# Patient Record
Sex: Female | Born: 1940
Health system: Southern US, Community
[De-identification: ages and names within clinical notes are randomized; demographics above are authoritative.]

## PROBLEM LIST (undated history)

## (undated) DIAGNOSIS — E559 Vitamin D deficiency, unspecified: Secondary | ICD-10-CM

## (undated) DIAGNOSIS — R519 Headache, unspecified: Secondary | ICD-10-CM

## (undated) DIAGNOSIS — I10 Essential (primary) hypertension: Secondary | ICD-10-CM

## (undated) DIAGNOSIS — Z973 Presence of spectacles and contact lenses: Secondary | ICD-10-CM

## (undated) DIAGNOSIS — E785 Hyperlipidemia, unspecified: Secondary | ICD-10-CM

## (undated) DIAGNOSIS — F419 Anxiety disorder, unspecified: Secondary | ICD-10-CM

## (undated) DIAGNOSIS — G4700 Insomnia, unspecified: Secondary | ICD-10-CM

## (undated) DIAGNOSIS — Q899 Congenital malformation, unspecified: Secondary | ICD-10-CM

## (undated) DIAGNOSIS — F329 Major depressive disorder, single episode, unspecified: Secondary | ICD-10-CM

## (undated) DIAGNOSIS — K219 Gastro-esophageal reflux disease without esophagitis: Secondary | ICD-10-CM

## (undated) DIAGNOSIS — F32A Depression, unspecified: Secondary | ICD-10-CM

## (undated) DIAGNOSIS — J309 Allergic rhinitis, unspecified: Secondary | ICD-10-CM

## (undated) DIAGNOSIS — R51 Headache: Secondary | ICD-10-CM

## (undated) DIAGNOSIS — K589 Irritable bowel syndrome without diarrhea: Secondary | ICD-10-CM

## (undated) DIAGNOSIS — R413 Other amnesia: Secondary | ICD-10-CM

## (undated) DIAGNOSIS — M1990 Unspecified osteoarthritis, unspecified site: Secondary | ICD-10-CM

## (undated) HISTORY — DX: Allergic rhinitis, unspecified: J30.9

## (undated) HISTORY — DX: Other amnesia: R41.3

## (undated) HISTORY — DX: Vitamin D deficiency, unspecified: E55.9

## (undated) HISTORY — DX: Irritable bowel syndrome, unspecified: K58.9

## (undated) HISTORY — DX: Depression, unspecified: F32.A

## (undated) HISTORY — PX: ABDOMINAL HYSTERECTOMY: SHX81

## (undated) HISTORY — DX: Hyperlipidemia, unspecified: E78.5

## (undated) HISTORY — DX: Headache: R51

## (undated) HISTORY — DX: Major depressive disorder, single episode, unspecified: F32.9

## (undated) HISTORY — DX: Headache, unspecified: R51.9

## (undated) HISTORY — DX: Insomnia, unspecified: G47.00

## (undated) HISTORY — DX: Congenital malformation, unspecified: Q89.9

## (undated) HISTORY — DX: Anxiety disorder, unspecified: F41.9

## (undated) HISTORY — DX: Gastro-esophageal reflux disease without esophagitis: K21.9

## (undated) HISTORY — PX: CATARACT EXTRACTION: SUR2

## (undated) HISTORY — PX: NECK SURGERY: SHX720

---

## 2000-08-07 ENCOUNTER — Encounter: Admission: RE | Admit: 2000-08-07 | Discharge: 2000-08-07 | Payer: Self-pay | Admitting: Neurosurgery

## 2000-08-07 ENCOUNTER — Encounter: Payer: Self-pay | Admitting: Neurosurgery

## 2001-07-19 ENCOUNTER — Other Ambulatory Visit: Admission: RE | Admit: 2001-07-19 | Discharge: 2001-07-19 | Payer: Self-pay | Admitting: *Deleted

## 2001-09-20 ENCOUNTER — Ambulatory Visit (HOSPITAL_COMMUNITY): Admission: RE | Admit: 2001-09-20 | Discharge: 2001-09-20 | Payer: Self-pay | Admitting: Gastroenterology

## 2001-09-27 ENCOUNTER — Encounter: Admission: RE | Admit: 2001-09-27 | Discharge: 2001-09-27 | Payer: Self-pay | Admitting: *Deleted

## 2002-07-25 ENCOUNTER — Other Ambulatory Visit: Admission: RE | Admit: 2002-07-25 | Discharge: 2002-07-25 | Payer: Self-pay | Admitting: *Deleted

## 2003-11-13 ENCOUNTER — Encounter: Admission: RE | Admit: 2003-11-13 | Discharge: 2003-11-13 | Payer: Self-pay | Admitting: Orthopedic Surgery

## 2004-07-22 ENCOUNTER — Ambulatory Visit (HOSPITAL_COMMUNITY): Admission: RE | Admit: 2004-07-22 | Discharge: 2004-07-22 | Payer: Self-pay | Admitting: *Deleted

## 2005-03-24 ENCOUNTER — Ambulatory Visit (HOSPITAL_COMMUNITY): Admission: RE | Admit: 2005-03-24 | Discharge: 2005-03-24 | Payer: Self-pay | Admitting: Gastroenterology

## 2005-03-24 ENCOUNTER — Encounter (INDEPENDENT_AMBULATORY_CARE_PROVIDER_SITE_OTHER): Payer: Self-pay | Admitting: *Deleted

## 2006-04-11 ENCOUNTER — Ambulatory Visit (HOSPITAL_COMMUNITY): Admission: RE | Admit: 2006-04-11 | Discharge: 2006-04-11 | Payer: Self-pay | Admitting: *Deleted

## 2006-04-18 ENCOUNTER — Encounter: Admission: RE | Admit: 2006-04-18 | Discharge: 2006-04-18 | Payer: Self-pay | Admitting: *Deleted

## 2007-05-18 ENCOUNTER — Encounter: Admission: RE | Admit: 2007-05-18 | Discharge: 2007-05-18 | Payer: Self-pay | Admitting: Obstetrics and Gynecology

## 2008-05-19 ENCOUNTER — Encounter: Admission: RE | Admit: 2008-05-19 | Discharge: 2008-05-19 | Payer: Self-pay | Admitting: Family Medicine

## 2008-09-11 ENCOUNTER — Encounter: Admission: RE | Admit: 2008-09-11 | Discharge: 2008-09-11 | Payer: Self-pay | Admitting: Orthopedic Surgery

## 2009-06-05 ENCOUNTER — Encounter: Admission: RE | Admit: 2009-06-05 | Discharge: 2009-06-05 | Payer: Self-pay | Admitting: Family Medicine

## 2010-05-21 ENCOUNTER — Ambulatory Visit (HOSPITAL_COMMUNITY): Admission: RE | Admit: 2010-05-21 | Discharge: 2010-05-21 | Payer: Self-pay | Admitting: Obstetrics and Gynecology

## 2010-06-10 ENCOUNTER — Ambulatory Visit: Admission: RE | Admit: 2010-06-10 | Discharge: 2010-06-10 | Payer: Self-pay | Admitting: Gynecologic Oncology

## 2010-06-15 ENCOUNTER — Encounter: Payer: Self-pay | Admitting: Obstetrics & Gynecology

## 2010-06-15 ENCOUNTER — Inpatient Hospital Stay (HOSPITAL_COMMUNITY): Admission: RE | Admit: 2010-06-15 | Discharge: 2010-06-21 | Payer: Self-pay | Admitting: Obstetrics & Gynecology

## 2010-06-16 ENCOUNTER — Encounter: Payer: Self-pay | Admitting: Obstetrics & Gynecology

## 2010-07-29 ENCOUNTER — Ambulatory Visit: Admission: RE | Admit: 2010-07-29 | Discharge: 2010-07-29 | Payer: Self-pay | Admitting: Gynecologic Oncology

## 2011-02-27 LAB — TYPE AND SCREEN
ABO/RH(D): A POS
ABO/RH(D): A POS
Antibody Screen: NEGATIVE
Antibody Screen: NEGATIVE

## 2011-02-27 LAB — DIFFERENTIAL
Basophils Absolute: 0 10*3/uL (ref 0.0–0.1)
Basophils Relative: 0 % (ref 0–1)
Eosinophils Absolute: 0.1 10*3/uL (ref 0.0–0.7)
Eosinophils Relative: 2 % (ref 0–5)
Lymphocytes Relative: 21 % (ref 12–46)
Lymphs Abs: 1.6 10*3/uL (ref 0.7–4.0)
Monocytes Absolute: 0.6 10*3/uL (ref 0.1–1.0)
Monocytes Relative: 7 % (ref 3–12)
Neutro Abs: 5.2 10*3/uL (ref 1.7–7.7)
Neutrophils Relative %: 70 % (ref 43–77)

## 2011-02-27 LAB — BASIC METABOLIC PANEL
BUN: 10 mg/dL (ref 6–23)
BUN: 8 mg/dL (ref 6–23)
BUN: 8 mg/dL (ref 6–23)
CO2: 25 mEq/L (ref 19–32)
CO2: 28 mEq/L (ref 19–32)
CO2: 29 mEq/L (ref 19–32)
Calcium: 8 mg/dL — ABNORMAL LOW (ref 8.4–10.5)
Calcium: 8.2 mg/dL — ABNORMAL LOW (ref 8.4–10.5)
Calcium: 8.9 mg/dL (ref 8.4–10.5)
Chloride: 101 mEq/L (ref 96–112)
Chloride: 102 mEq/L (ref 96–112)
Chloride: 106 mEq/L (ref 96–112)
Creatinine, Ser: 0.78 mg/dL (ref 0.4–1.2)
Creatinine, Ser: 0.85 mg/dL (ref 0.4–1.2)
Creatinine, Ser: 0.94 mg/dL (ref 0.4–1.2)
GFR calc Af Amer: >60 mL/min (ref >60)
GFR calc Af Amer: >60 mL/min (ref >60)
GFR calc Af Amer: >60 mL/min (ref >60)
GFR calc non Af Amer: 59 mL/min — ABNORMAL LOW (ref >60)
GFR calc non Af Amer: >60 mL/min (ref >60)
GFR calc non Af Amer: >60 mL/min (ref >60)
Glucose, Bld: 129 mg/dL — ABNORMAL HIGH (ref 70–99)
Glucose, Bld: 148 mg/dL — ABNORMAL HIGH (ref 70–99)
Glucose, Bld: 173 mg/dL — ABNORMAL HIGH (ref 70–99)
Potassium: 3.3 mEq/L — ABNORMAL LOW (ref 3.5–5.1)
Potassium: 4.3 mEq/L (ref 3.5–5.1)
Potassium: 4.4 mEq/L (ref 3.5–5.1)
Sodium: 134 mEq/L — ABNORMAL LOW (ref 135–145)
Sodium: 136 mEq/L (ref 135–145)
Sodium: 137 mEq/L (ref 135–145)

## 2011-02-27 LAB — CBC
HCT: 23.1 % — ABNORMAL LOW (ref 36.0–46.0)
HCT: 25.8 % — ABNORMAL LOW (ref 36.0–46.0)
HCT: 28.4 % — ABNORMAL LOW (ref 36.0–46.0)
HCT: 37 % (ref 36.0–46.0)
Hemoglobin: 12.5 g/dL (ref 12.0–15.0)
Hemoglobin: 7.7 g/dL — ABNORMAL LOW (ref 12.0–15.0)
Hemoglobin: 8.7 g/dL — ABNORMAL LOW (ref 12.0–15.0)
Hemoglobin: 9.5 g/dL — ABNORMAL LOW (ref 12.0–15.0)
MCH: 26.9 pg (ref 26.0–34.0)
MCH: 27.1 pg (ref 26.0–34.0)
MCH: 27.3 pg (ref 26.0–34.0)
MCH: 27.4 pg (ref 26.0–34.0)
MCHC: 33.2 g/dL (ref 30.0–36.0)
MCHC: 33.6 g/dL (ref 30.0–36.0)
MCHC: 33.7 g/dL (ref 30.0–36.0)
MCHC: 33.7 g/dL (ref 30.0–36.0)
MCV: 80.5 fL (ref 78.0–100.0)
MCV: 80.8 fL (ref 78.0–100.0)
MCV: 81.3 fL (ref 78.0–100.0)
MCV: 81.4 fL (ref 78.0–100.0)
Platelets: 197 10*3/uL (ref 150–400)
Platelets: 237 10*3/uL (ref 150–400)
Platelets: 265 10*3/uL (ref 150–400)
Platelets: 270 10*3/uL (ref 150–400)
RBC: 2.85 MIL/uL — ABNORMAL LOW (ref 3.87–5.11)
RBC: 3.2 MIL/uL — ABNORMAL LOW (ref 3.87–5.11)
RBC: 3.49 MIL/uL — ABNORMAL LOW (ref 3.87–5.11)
RBC: 4.55 MIL/uL (ref 3.87–5.11)
RDW: 15.6 % — ABNORMAL HIGH (ref 11.5–15.5)
RDW: 15.9 % — ABNORMAL HIGH (ref 11.5–15.5)
RDW: 16.3 % — ABNORMAL HIGH (ref 11.5–15.5)
RDW: 16.6 % — ABNORMAL HIGH (ref 11.5–15.5)
WBC: 11.3 10*3/uL — ABNORMAL HIGH (ref 4.0–10.5)
WBC: 12.3 10*3/uL — ABNORMAL HIGH (ref 4.0–10.5)
WBC: 19.7 10*3/uL — ABNORMAL HIGH (ref 4.0–10.5)
WBC: 7.6 10*3/uL (ref 4.0–10.5)

## 2011-02-27 LAB — COMPREHENSIVE METABOLIC PANEL
ALT: 13 U/L (ref 0–35)
AST: 17 U/L (ref 0–37)
Albumin: 3.5 g/dL (ref 3.5–5.2)
Alkaline Phosphatase: 68 U/L (ref 39–117)
BUN: 12 mg/dL (ref 6–23)
CO2: 30 mEq/L (ref 19–32)
Calcium: 9.4 mg/dL (ref 8.4–10.5)
Chloride: 100 mEq/L (ref 96–112)
Creatinine, Ser: 0.99 mg/dL (ref 0.4–1.2)
GFR calc Af Amer: >60 mL/min (ref >60)
GFR calc non Af Amer: 55 mL/min — ABNORMAL LOW (ref >60)
Glucose, Bld: 121 mg/dL — ABNORMAL HIGH (ref 70–99)
Potassium: 3 mEq/L — ABNORMAL LOW (ref 3.5–5.1)
Sodium: 138 mEq/L (ref 135–145)
Total Bilirubin: 0.1 mg/dL — ABNORMAL LOW (ref 0.3–1.2)
Total Protein: 6.8 g/dL (ref 6.0–8.3)

## 2011-02-27 LAB — HEMOGLOBIN AND HEMATOCRIT, BLOOD
HCT: 23.5 % — ABNORMAL LOW (ref 36.0–46.0)
HCT: 30.1 % — ABNORMAL LOW (ref 36.0–46.0)
Hemoglobin: 10.5 g/dL — ABNORMAL LOW (ref 12.0–15.0)
Hemoglobin: 8 g/dL — ABNORMAL LOW (ref 12.0–15.0)

## 2011-02-27 LAB — URINALYSIS, ROUTINE W REFLEX MICROSCOPIC
Bilirubin Urine: NEGATIVE
Glucose, UA: NEGATIVE mg/dL
Hgb urine dipstick: NEGATIVE
Ketones, ur: NEGATIVE mg/dL
Nitrite: NEGATIVE
Protein, ur: NEGATIVE mg/dL
Specific Gravity, Urine: 1.013 (ref 1.005–1.030)
Urobilinogen, UA: 1 mg/dL (ref 0.0–1.0)
pH: 7 (ref 5.0–8.0)

## 2011-02-27 LAB — PREPARE RBC (CROSSMATCH)

## 2011-02-27 LAB — ABO/RH: ABO/RH(D): A POS

## 2011-02-27 LAB — SURGICAL PCR SCREEN
MRSA, PCR: NEGATIVE
Staphylococcus aureus: NEGATIVE

## 2011-02-27 LAB — TROPONIN I: Troponin I: 0.02 ng/mL (ref 0.00–0.06)

## 2011-02-27 LAB — POCT I-STAT 4, (NA,K, GLUC, HGB,HCT)
Glucose, Bld: 123 mg/dL — ABNORMAL HIGH (ref 70–99)
HCT: 25 % — ABNORMAL LOW (ref 36.0–46.0)
Hemoglobin: 8.5 g/dL — ABNORMAL LOW (ref 12.0–15.0)
Potassium: 3.2 mEq/L — ABNORMAL LOW (ref 3.5–5.1)
Sodium: 137 mEq/L (ref 135–145)

## 2011-02-27 LAB — URINE MICROSCOPIC-ADD ON

## 2011-02-27 LAB — POTASSIUM: Potassium: 3.4 mEq/L — ABNORMAL LOW (ref 3.5–5.1)

## 2011-12-29 DIAGNOSIS — E7800 Pure hypercholesterolemia, unspecified: Secondary | ICD-10-CM | POA: Diagnosis not present

## 2011-12-29 DIAGNOSIS — I10 Essential (primary) hypertension: Secondary | ICD-10-CM | POA: Diagnosis not present

## 2011-12-29 DIAGNOSIS — Z1331 Encounter for screening for depression: Secondary | ICD-10-CM | POA: Diagnosis not present

## 2011-12-29 DIAGNOSIS — F411 Generalized anxiety disorder: Secondary | ICD-10-CM | POA: Diagnosis not present

## 2012-04-25 ENCOUNTER — Other Ambulatory Visit: Payer: Self-pay | Admitting: Family Medicine

## 2012-04-25 DIAGNOSIS — Z1231 Encounter for screening mammogram for malignant neoplasm of breast: Secondary | ICD-10-CM

## 2012-05-14 ENCOUNTER — Ambulatory Visit
Admission: RE | Admit: 2012-05-14 | Discharge: 2012-05-14 | Disposition: A | Discharge status: Home / Self Care | Payer: Medicare Other | Source: Ambulatory Visit | Attending: Family Medicine | Admitting: Family Medicine

## 2012-05-14 DIAGNOSIS — Z1231 Encounter for screening mammogram for malignant neoplasm of breast: Secondary | ICD-10-CM

## 2012-05-17 ENCOUNTER — Other Ambulatory Visit: Payer: Self-pay | Admitting: Family Medicine

## 2012-05-17 DIAGNOSIS — R928 Other abnormal and inconclusive findings on diagnostic imaging of breast: Secondary | ICD-10-CM

## 2012-06-07 DIAGNOSIS — R143 Flatulence: Secondary | ICD-10-CM | POA: Diagnosis not present

## 2012-06-07 DIAGNOSIS — R141 Gas pain: Secondary | ICD-10-CM | POA: Diagnosis not present

## 2012-06-07 DIAGNOSIS — K5900 Constipation, unspecified: Secondary | ICD-10-CM | POA: Diagnosis not present

## 2012-06-07 DIAGNOSIS — R1033 Periumbilical pain: Secondary | ICD-10-CM | POA: Diagnosis not present

## 2012-06-15 ENCOUNTER — Ambulatory Visit
Admission: RE | Admit: 2012-06-15 | Discharge: 2012-06-15 | Disposition: A | Discharge status: Home / Self Care | Payer: Medicare Other | Source: Ambulatory Visit | Attending: Family Medicine | Admitting: Family Medicine

## 2012-06-15 DIAGNOSIS — R928 Other abnormal and inconclusive findings on diagnostic imaging of breast: Secondary | ICD-10-CM

## 2012-06-15 DIAGNOSIS — N6489 Other specified disorders of breast: Secondary | ICD-10-CM | POA: Diagnosis not present

## 2012-06-18 DIAGNOSIS — H01009 Unspecified blepharitis unspecified eye, unspecified eyelid: Secondary | ICD-10-CM | POA: Diagnosis not present

## 2012-06-18 DIAGNOSIS — H02059 Trichiasis without entropian unspecified eye, unspecified eyelid: Secondary | ICD-10-CM | POA: Diagnosis not present

## 2012-06-18 DIAGNOSIS — H04129 Dry eye syndrome of unspecified lacrimal gland: Secondary | ICD-10-CM | POA: Diagnosis not present

## 2012-06-18 DIAGNOSIS — H2510 Age-related nuclear cataract, unspecified eye: Secondary | ICD-10-CM | POA: Diagnosis not present

## 2012-12-20 DIAGNOSIS — F411 Generalized anxiety disorder: Secondary | ICD-10-CM | POA: Diagnosis not present

## 2012-12-20 DIAGNOSIS — I10 Essential (primary) hypertension: Secondary | ICD-10-CM | POA: Diagnosis not present

## 2013-04-09 DIAGNOSIS — E7800 Pure hypercholesterolemia, unspecified: Secondary | ICD-10-CM | POA: Diagnosis not present

## 2013-04-17 DIAGNOSIS — M5137 Other intervertebral disc degeneration, lumbosacral region: Secondary | ICD-10-CM | POA: Diagnosis not present

## 2013-04-17 DIAGNOSIS — M999 Biomechanical lesion, unspecified: Secondary | ICD-10-CM | POA: Diagnosis not present

## 2013-04-17 DIAGNOSIS — M5106 Intervertebral disc disorders with myelopathy, lumbar region: Secondary | ICD-10-CM | POA: Diagnosis not present

## 2013-04-17 DIAGNOSIS — M5430 Sciatica, unspecified side: Secondary | ICD-10-CM | POA: Diagnosis not present

## 2013-04-23 DIAGNOSIS — M999 Biomechanical lesion, unspecified: Secondary | ICD-10-CM | POA: Diagnosis not present

## 2013-04-23 DIAGNOSIS — M5137 Other intervertebral disc degeneration, lumbosacral region: Secondary | ICD-10-CM | POA: Diagnosis not present

## 2013-04-23 DIAGNOSIS — M5430 Sciatica, unspecified side: Secondary | ICD-10-CM | POA: Diagnosis not present

## 2013-04-23 DIAGNOSIS — M5106 Intervertebral disc disorders with myelopathy, lumbar region: Secondary | ICD-10-CM | POA: Diagnosis not present

## 2013-04-24 DIAGNOSIS — M5106 Intervertebral disc disorders with myelopathy, lumbar region: Secondary | ICD-10-CM | POA: Diagnosis not present

## 2013-04-24 DIAGNOSIS — M5430 Sciatica, unspecified side: Secondary | ICD-10-CM | POA: Diagnosis not present

## 2013-04-24 DIAGNOSIS — M5137 Other intervertebral disc degeneration, lumbosacral region: Secondary | ICD-10-CM | POA: Diagnosis not present

## 2013-04-24 DIAGNOSIS — M999 Biomechanical lesion, unspecified: Secondary | ICD-10-CM | POA: Diagnosis not present

## 2013-04-25 DIAGNOSIS — M5106 Intervertebral disc disorders with myelopathy, lumbar region: Secondary | ICD-10-CM | POA: Diagnosis not present

## 2013-04-25 DIAGNOSIS — M5137 Other intervertebral disc degeneration, lumbosacral region: Secondary | ICD-10-CM | POA: Diagnosis not present

## 2013-04-25 DIAGNOSIS — M999 Biomechanical lesion, unspecified: Secondary | ICD-10-CM | POA: Diagnosis not present

## 2013-04-25 DIAGNOSIS — M5430 Sciatica, unspecified side: Secondary | ICD-10-CM | POA: Diagnosis not present

## 2013-04-29 DIAGNOSIS — M5137 Other intervertebral disc degeneration, lumbosacral region: Secondary | ICD-10-CM | POA: Diagnosis not present

## 2013-04-29 DIAGNOSIS — M5106 Intervertebral disc disorders with myelopathy, lumbar region: Secondary | ICD-10-CM | POA: Diagnosis not present

## 2013-04-29 DIAGNOSIS — M5430 Sciatica, unspecified side: Secondary | ICD-10-CM | POA: Diagnosis not present

## 2013-04-29 DIAGNOSIS — M999 Biomechanical lesion, unspecified: Secondary | ICD-10-CM | POA: Diagnosis not present

## 2013-04-30 DIAGNOSIS — M999 Biomechanical lesion, unspecified: Secondary | ICD-10-CM | POA: Diagnosis not present

## 2013-04-30 DIAGNOSIS — M5106 Intervertebral disc disorders with myelopathy, lumbar region: Secondary | ICD-10-CM | POA: Diagnosis not present

## 2013-04-30 DIAGNOSIS — M5137 Other intervertebral disc degeneration, lumbosacral region: Secondary | ICD-10-CM | POA: Diagnosis not present

## 2013-04-30 DIAGNOSIS — M5430 Sciatica, unspecified side: Secondary | ICD-10-CM | POA: Diagnosis not present

## 2013-05-01 DIAGNOSIS — M5137 Other intervertebral disc degeneration, lumbosacral region: Secondary | ICD-10-CM | POA: Diagnosis not present

## 2013-05-01 DIAGNOSIS — M5106 Intervertebral disc disorders with myelopathy, lumbar region: Secondary | ICD-10-CM | POA: Diagnosis not present

## 2013-05-01 DIAGNOSIS — M5430 Sciatica, unspecified side: Secondary | ICD-10-CM | POA: Diagnosis not present

## 2013-05-01 DIAGNOSIS — M999 Biomechanical lesion, unspecified: Secondary | ICD-10-CM | POA: Diagnosis not present

## 2013-05-08 DIAGNOSIS — M5106 Intervertebral disc disorders with myelopathy, lumbar region: Secondary | ICD-10-CM | POA: Diagnosis not present

## 2013-05-08 DIAGNOSIS — M5430 Sciatica, unspecified side: Secondary | ICD-10-CM | POA: Diagnosis not present

## 2013-05-08 DIAGNOSIS — M999 Biomechanical lesion, unspecified: Secondary | ICD-10-CM | POA: Diagnosis not present

## 2013-05-08 DIAGNOSIS — M5137 Other intervertebral disc degeneration, lumbosacral region: Secondary | ICD-10-CM | POA: Diagnosis not present

## 2013-05-09 DIAGNOSIS — M5430 Sciatica, unspecified side: Secondary | ICD-10-CM | POA: Diagnosis not present

## 2013-05-09 DIAGNOSIS — M5137 Other intervertebral disc degeneration, lumbosacral region: Secondary | ICD-10-CM | POA: Diagnosis not present

## 2013-05-09 DIAGNOSIS — M999 Biomechanical lesion, unspecified: Secondary | ICD-10-CM | POA: Diagnosis not present

## 2013-05-09 DIAGNOSIS — M5106 Intervertebral disc disorders with myelopathy, lumbar region: Secondary | ICD-10-CM | POA: Diagnosis not present

## 2013-05-13 DIAGNOSIS — M5430 Sciatica, unspecified side: Secondary | ICD-10-CM | POA: Diagnosis not present

## 2013-05-13 DIAGNOSIS — M5106 Intervertebral disc disorders with myelopathy, lumbar region: Secondary | ICD-10-CM | POA: Diagnosis not present

## 2013-05-13 DIAGNOSIS — M5137 Other intervertebral disc degeneration, lumbosacral region: Secondary | ICD-10-CM | POA: Diagnosis not present

## 2013-05-13 DIAGNOSIS — M999 Biomechanical lesion, unspecified: Secondary | ICD-10-CM | POA: Diagnosis not present

## 2013-05-14 DIAGNOSIS — M5106 Intervertebral disc disorders with myelopathy, lumbar region: Secondary | ICD-10-CM | POA: Diagnosis not present

## 2013-05-14 DIAGNOSIS — M5137 Other intervertebral disc degeneration, lumbosacral region: Secondary | ICD-10-CM | POA: Diagnosis not present

## 2013-05-14 DIAGNOSIS — M5430 Sciatica, unspecified side: Secondary | ICD-10-CM | POA: Diagnosis not present

## 2013-05-14 DIAGNOSIS — M999 Biomechanical lesion, unspecified: Secondary | ICD-10-CM | POA: Diagnosis not present

## 2013-05-28 DIAGNOSIS — M5137 Other intervertebral disc degeneration, lumbosacral region: Secondary | ICD-10-CM | POA: Diagnosis not present

## 2013-05-28 DIAGNOSIS — M999 Biomechanical lesion, unspecified: Secondary | ICD-10-CM | POA: Diagnosis not present

## 2013-05-28 DIAGNOSIS — M5106 Intervertebral disc disorders with myelopathy, lumbar region: Secondary | ICD-10-CM | POA: Diagnosis not present

## 2013-05-28 DIAGNOSIS — M5430 Sciatica, unspecified side: Secondary | ICD-10-CM | POA: Diagnosis not present

## 2013-05-30 DIAGNOSIS — M5137 Other intervertebral disc degeneration, lumbosacral region: Secondary | ICD-10-CM | POA: Diagnosis not present

## 2013-05-30 DIAGNOSIS — M999 Biomechanical lesion, unspecified: Secondary | ICD-10-CM | POA: Diagnosis not present

## 2013-05-30 DIAGNOSIS — M5106 Intervertebral disc disorders with myelopathy, lumbar region: Secondary | ICD-10-CM | POA: Diagnosis not present

## 2013-05-30 DIAGNOSIS — M5430 Sciatica, unspecified side: Secondary | ICD-10-CM | POA: Diagnosis not present

## 2013-06-04 DIAGNOSIS — M5430 Sciatica, unspecified side: Secondary | ICD-10-CM | POA: Diagnosis not present

## 2013-06-04 DIAGNOSIS — M5106 Intervertebral disc disorders with myelopathy, lumbar region: Secondary | ICD-10-CM | POA: Diagnosis not present

## 2013-06-04 DIAGNOSIS — M999 Biomechanical lesion, unspecified: Secondary | ICD-10-CM | POA: Diagnosis not present

## 2013-06-04 DIAGNOSIS — M5137 Other intervertebral disc degeneration, lumbosacral region: Secondary | ICD-10-CM | POA: Diagnosis not present

## 2013-06-05 DIAGNOSIS — M5137 Other intervertebral disc degeneration, lumbosacral region: Secondary | ICD-10-CM | POA: Diagnosis not present

## 2013-06-05 DIAGNOSIS — M999 Biomechanical lesion, unspecified: Secondary | ICD-10-CM | POA: Diagnosis not present

## 2013-06-05 DIAGNOSIS — M5430 Sciatica, unspecified side: Secondary | ICD-10-CM | POA: Diagnosis not present

## 2013-06-05 DIAGNOSIS — M5106 Intervertebral disc disorders with myelopathy, lumbar region: Secondary | ICD-10-CM | POA: Diagnosis not present

## 2013-06-18 DIAGNOSIS — M999 Biomechanical lesion, unspecified: Secondary | ICD-10-CM | POA: Diagnosis not present

## 2013-06-18 DIAGNOSIS — M5137 Other intervertebral disc degeneration, lumbosacral region: Secondary | ICD-10-CM | POA: Diagnosis not present

## 2013-06-18 DIAGNOSIS — M5430 Sciatica, unspecified side: Secondary | ICD-10-CM | POA: Diagnosis not present

## 2013-06-18 DIAGNOSIS — M5106 Intervertebral disc disorders with myelopathy, lumbar region: Secondary | ICD-10-CM | POA: Diagnosis not present

## 2013-06-20 DIAGNOSIS — M5430 Sciatica, unspecified side: Secondary | ICD-10-CM | POA: Diagnosis not present

## 2013-06-20 DIAGNOSIS — M999 Biomechanical lesion, unspecified: Secondary | ICD-10-CM | POA: Diagnosis not present

## 2013-06-20 DIAGNOSIS — M5137 Other intervertebral disc degeneration, lumbosacral region: Secondary | ICD-10-CM | POA: Diagnosis not present

## 2013-06-20 DIAGNOSIS — M5106 Intervertebral disc disorders with myelopathy, lumbar region: Secondary | ICD-10-CM | POA: Diagnosis not present

## 2013-06-25 DIAGNOSIS — M5430 Sciatica, unspecified side: Secondary | ICD-10-CM | POA: Diagnosis not present

## 2013-06-25 DIAGNOSIS — M5137 Other intervertebral disc degeneration, lumbosacral region: Secondary | ICD-10-CM | POA: Diagnosis not present

## 2013-06-25 DIAGNOSIS — M5106 Intervertebral disc disorders with myelopathy, lumbar region: Secondary | ICD-10-CM | POA: Diagnosis not present

## 2013-06-25 DIAGNOSIS — M999 Biomechanical lesion, unspecified: Secondary | ICD-10-CM | POA: Diagnosis not present

## 2013-07-02 DIAGNOSIS — M5137 Other intervertebral disc degeneration, lumbosacral region: Secondary | ICD-10-CM | POA: Diagnosis not present

## 2013-07-02 DIAGNOSIS — M5106 Intervertebral disc disorders with myelopathy, lumbar region: Secondary | ICD-10-CM | POA: Diagnosis not present

## 2013-07-02 DIAGNOSIS — M5430 Sciatica, unspecified side: Secondary | ICD-10-CM | POA: Diagnosis not present

## 2013-07-02 DIAGNOSIS — M999 Biomechanical lesion, unspecified: Secondary | ICD-10-CM | POA: Diagnosis not present

## 2013-07-04 DIAGNOSIS — M5137 Other intervertebral disc degeneration, lumbosacral region: Secondary | ICD-10-CM | POA: Diagnosis not present

## 2013-07-04 DIAGNOSIS — M5430 Sciatica, unspecified side: Secondary | ICD-10-CM | POA: Diagnosis not present

## 2013-07-04 DIAGNOSIS — M999 Biomechanical lesion, unspecified: Secondary | ICD-10-CM | POA: Diagnosis not present

## 2013-07-04 DIAGNOSIS — M5106 Intervertebral disc disorders with myelopathy, lumbar region: Secondary | ICD-10-CM | POA: Diagnosis not present

## 2013-07-11 DIAGNOSIS — M5137 Other intervertebral disc degeneration, lumbosacral region: Secondary | ICD-10-CM | POA: Diagnosis not present

## 2013-07-11 DIAGNOSIS — M999 Biomechanical lesion, unspecified: Secondary | ICD-10-CM | POA: Diagnosis not present

## 2013-07-11 DIAGNOSIS — M5106 Intervertebral disc disorders with myelopathy, lumbar region: Secondary | ICD-10-CM | POA: Diagnosis not present

## 2013-07-11 DIAGNOSIS — M5430 Sciatica, unspecified side: Secondary | ICD-10-CM | POA: Diagnosis not present

## 2013-07-15 DIAGNOSIS — M999 Biomechanical lesion, unspecified: Secondary | ICD-10-CM | POA: Diagnosis not present

## 2013-07-15 DIAGNOSIS — M5137 Other intervertebral disc degeneration, lumbosacral region: Secondary | ICD-10-CM | POA: Diagnosis not present

## 2013-07-15 DIAGNOSIS — M5430 Sciatica, unspecified side: Secondary | ICD-10-CM | POA: Diagnosis not present

## 2013-07-15 DIAGNOSIS — M5106 Intervertebral disc disorders with myelopathy, lumbar region: Secondary | ICD-10-CM | POA: Diagnosis not present

## 2013-07-24 ENCOUNTER — Other Ambulatory Visit: Payer: Self-pay

## 2013-07-24 DIAGNOSIS — Z1231 Encounter for screening mammogram for malignant neoplasm of breast: Secondary | ICD-10-CM

## 2013-07-29 ENCOUNTER — Ambulatory Visit
Admission: RE | Admit: 2013-07-29 | Discharge: 2013-07-29 | Disposition: A | Discharge status: Home / Self Care | Payer: Medicare Other | Source: Ambulatory Visit

## 2013-07-29 DIAGNOSIS — Z1231 Encounter for screening mammogram for malignant neoplasm of breast: Secondary | ICD-10-CM | POA: Diagnosis not present

## 2013-07-30 DIAGNOSIS — H2510 Age-related nuclear cataract, unspecified eye: Secondary | ICD-10-CM | POA: Diagnosis not present

## 2013-07-30 DIAGNOSIS — H04129 Dry eye syndrome of unspecified lacrimal gland: Secondary | ICD-10-CM | POA: Diagnosis not present

## 2013-07-30 DIAGNOSIS — H524 Presbyopia: Secondary | ICD-10-CM | POA: Diagnosis not present

## 2013-07-30 DIAGNOSIS — H25019 Cortical age-related cataract, unspecified eye: Secondary | ICD-10-CM | POA: Diagnosis not present

## 2013-08-01 ENCOUNTER — Other Ambulatory Visit: Payer: Self-pay | Admitting: Family Medicine

## 2013-08-01 DIAGNOSIS — R928 Other abnormal and inconclusive findings on diagnostic imaging of breast: Secondary | ICD-10-CM

## 2013-08-05 DIAGNOSIS — M5137 Other intervertebral disc degeneration, lumbosacral region: Secondary | ICD-10-CM | POA: Diagnosis not present

## 2013-08-05 DIAGNOSIS — M5106 Intervertebral disc disorders with myelopathy, lumbar region: Secondary | ICD-10-CM | POA: Diagnosis not present

## 2013-08-05 DIAGNOSIS — M5430 Sciatica, unspecified side: Secondary | ICD-10-CM | POA: Diagnosis not present

## 2013-08-05 DIAGNOSIS — M999 Biomechanical lesion, unspecified: Secondary | ICD-10-CM | POA: Diagnosis not present

## 2013-08-14 DIAGNOSIS — M5430 Sciatica, unspecified side: Secondary | ICD-10-CM | POA: Diagnosis not present

## 2013-08-14 DIAGNOSIS — M999 Biomechanical lesion, unspecified: Secondary | ICD-10-CM | POA: Diagnosis not present

## 2013-08-14 DIAGNOSIS — M5106 Intervertebral disc disorders with myelopathy, lumbar region: Secondary | ICD-10-CM | POA: Diagnosis not present

## 2013-08-14 DIAGNOSIS — M5137 Other intervertebral disc degeneration, lumbosacral region: Secondary | ICD-10-CM | POA: Diagnosis not present

## 2013-08-26 ENCOUNTER — Ambulatory Visit
Admission: RE | Admit: 2013-08-26 | Discharge: 2013-08-26 | Disposition: A | Discharge status: Home / Self Care | Payer: Medicare Other | Source: Ambulatory Visit | Attending: Family Medicine | Admitting: Family Medicine

## 2013-08-26 DIAGNOSIS — R928 Other abnormal and inconclusive findings on diagnostic imaging of breast: Secondary | ICD-10-CM

## 2013-08-28 DIAGNOSIS — M5137 Other intervertebral disc degeneration, lumbosacral region: Secondary | ICD-10-CM | POA: Diagnosis not present

## 2013-08-28 DIAGNOSIS — M5430 Sciatica, unspecified side: Secondary | ICD-10-CM | POA: Diagnosis not present

## 2013-08-28 DIAGNOSIS — M999 Biomechanical lesion, unspecified: Secondary | ICD-10-CM | POA: Diagnosis not present

## 2013-08-28 DIAGNOSIS — M5106 Intervertebral disc disorders with myelopathy, lumbar region: Secondary | ICD-10-CM | POA: Diagnosis not present

## 2013-09-04 DIAGNOSIS — L821 Other seborrheic keratosis: Secondary | ICD-10-CM | POA: Diagnosis not present

## 2013-09-11 DIAGNOSIS — M5106 Intervertebral disc disorders with myelopathy, lumbar region: Secondary | ICD-10-CM | POA: Diagnosis not present

## 2013-09-11 DIAGNOSIS — M5430 Sciatica, unspecified side: Secondary | ICD-10-CM | POA: Diagnosis not present

## 2013-09-11 DIAGNOSIS — M5137 Other intervertebral disc degeneration, lumbosacral region: Secondary | ICD-10-CM | POA: Diagnosis not present

## 2013-09-11 DIAGNOSIS — M999 Biomechanical lesion, unspecified: Secondary | ICD-10-CM | POA: Diagnosis not present

## 2013-12-20 DIAGNOSIS — I10 Essential (primary) hypertension: Secondary | ICD-10-CM | POA: Diagnosis not present

## 2013-12-20 DIAGNOSIS — E7800 Pure hypercholesterolemia, unspecified: Secondary | ICD-10-CM | POA: Diagnosis not present

## 2014-01-17 DIAGNOSIS — H25019 Cortical age-related cataract, unspecified eye: Secondary | ICD-10-CM | POA: Diagnosis not present

## 2014-01-17 DIAGNOSIS — H2510 Age-related nuclear cataract, unspecified eye: Secondary | ICD-10-CM | POA: Diagnosis not present

## 2014-02-12 ENCOUNTER — Other Ambulatory Visit: Payer: Self-pay | Admitting: Family Medicine

## 2014-02-12 DIAGNOSIS — R921 Mammographic calcification found on diagnostic imaging of breast: Secondary | ICD-10-CM

## 2014-02-19 DIAGNOSIS — H2589 Other age-related cataract: Secondary | ICD-10-CM | POA: Diagnosis not present

## 2014-02-19 DIAGNOSIS — H2510 Age-related nuclear cataract, unspecified eye: Secondary | ICD-10-CM | POA: Diagnosis not present

## 2014-02-19 DIAGNOSIS — H25019 Cortical age-related cataract, unspecified eye: Secondary | ICD-10-CM | POA: Diagnosis not present

## 2014-02-25 ENCOUNTER — Ambulatory Visit
Admission: RE | Admit: 2014-02-25 | Discharge: 2014-02-25 | Disposition: A | Discharge status: Home / Self Care | Payer: Medicare Other | Source: Ambulatory Visit | Attending: Family Medicine | Admitting: Family Medicine

## 2014-02-25 DIAGNOSIS — R921 Mammographic calcification found on diagnostic imaging of breast: Secondary | ICD-10-CM

## 2014-02-25 DIAGNOSIS — R928 Other abnormal and inconclusive findings on diagnostic imaging of breast: Secondary | ICD-10-CM | POA: Diagnosis not present

## 2014-03-05 DIAGNOSIS — H2589 Other age-related cataract: Secondary | ICD-10-CM | POA: Diagnosis not present

## 2014-03-05 DIAGNOSIS — H2510 Age-related nuclear cataract, unspecified eye: Secondary | ICD-10-CM | POA: Diagnosis not present

## 2014-06-19 DIAGNOSIS — F411 Generalized anxiety disorder: Secondary | ICD-10-CM | POA: Diagnosis not present

## 2014-06-19 DIAGNOSIS — E7800 Pure hypercholesterolemia, unspecified: Secondary | ICD-10-CM | POA: Diagnosis not present

## 2014-06-19 DIAGNOSIS — Z1331 Encounter for screening for depression: Secondary | ICD-10-CM | POA: Diagnosis not present

## 2014-06-19 DIAGNOSIS — I10 Essential (primary) hypertension: Secondary | ICD-10-CM | POA: Diagnosis not present

## 2014-07-23 ENCOUNTER — Other Ambulatory Visit: Payer: Self-pay

## 2014-07-23 DIAGNOSIS — Z1231 Encounter for screening mammogram for malignant neoplasm of breast: Secondary | ICD-10-CM

## 2014-08-07 ENCOUNTER — Ambulatory Visit
Admission: RE | Admit: 2014-08-07 | Discharge: 2014-08-07 | Disposition: A | Discharge status: Home / Self Care | Payer: Medicare Other | Source: Ambulatory Visit

## 2014-08-07 DIAGNOSIS — Z1231 Encounter for screening mammogram for malignant neoplasm of breast: Secondary | ICD-10-CM | POA: Diagnosis not present

## 2014-12-18 DIAGNOSIS — I10 Essential (primary) hypertension: Secondary | ICD-10-CM | POA: Diagnosis not present

## 2014-12-18 DIAGNOSIS — F419 Anxiety disorder, unspecified: Secondary | ICD-10-CM | POA: Diagnosis not present

## 2014-12-18 DIAGNOSIS — E780 Pure hypercholesterolemia: Secondary | ICD-10-CM | POA: Diagnosis not present

## 2015-08-07 ENCOUNTER — Other Ambulatory Visit: Payer: Self-pay

## 2015-08-07 DIAGNOSIS — Z1231 Encounter for screening mammogram for malignant neoplasm of breast: Secondary | ICD-10-CM

## 2015-08-20 DIAGNOSIS — E785 Hyperlipidemia, unspecified: Secondary | ICD-10-CM | POA: Diagnosis not present

## 2015-08-20 DIAGNOSIS — I10 Essential (primary) hypertension: Secondary | ICD-10-CM | POA: Diagnosis not present

## 2015-08-20 DIAGNOSIS — Z23 Encounter for immunization: Secondary | ICD-10-CM | POA: Diagnosis not present

## 2015-08-20 DIAGNOSIS — F411 Generalized anxiety disorder: Secondary | ICD-10-CM | POA: Diagnosis not present

## 2015-08-20 DIAGNOSIS — Z1389 Encounter for screening for other disorder: Secondary | ICD-10-CM | POA: Diagnosis not present

## 2015-09-03 ENCOUNTER — Ambulatory Visit
Admission: RE | Admit: 2015-09-03 | Discharge: 2015-09-03 | Disposition: A | Discharge status: Home / Self Care | Payer: Medicare Other | Source: Ambulatory Visit | Attending: Gastroenterology | Admitting: Gastroenterology

## 2015-09-03 ENCOUNTER — Other Ambulatory Visit: Payer: Self-pay | Admitting: Gastroenterology

## 2015-09-03 DIAGNOSIS — R140 Abdominal distension (gaseous): Secondary | ICD-10-CM | POA: Diagnosis not present

## 2015-09-03 DIAGNOSIS — K5900 Constipation, unspecified: Secondary | ICD-10-CM | POA: Diagnosis not present

## 2015-09-21 ENCOUNTER — Ambulatory Visit: Payer: Federal, State, Local not specified - PPO

## 2015-09-22 ENCOUNTER — Ambulatory Visit
Admission: RE | Admit: 2015-09-22 | Discharge: 2015-09-22 | Disposition: A | Discharge status: Home / Self Care | Payer: Medicare Other | Source: Ambulatory Visit

## 2015-09-22 DIAGNOSIS — Z1231 Encounter for screening mammogram for malignant neoplasm of breast: Secondary | ICD-10-CM

## 2015-10-20 DIAGNOSIS — S63619A Unspecified sprain of unspecified finger, initial encounter: Secondary | ICD-10-CM | POA: Diagnosis not present

## 2015-10-20 DIAGNOSIS — M79641 Pain in right hand: Secondary | ICD-10-CM | POA: Diagnosis not present

## 2015-10-20 DIAGNOSIS — E7800 Pure hypercholesterolemia, unspecified: Secondary | ICD-10-CM | POA: Diagnosis not present

## 2015-10-20 DIAGNOSIS — R296 Repeated falls: Secondary | ICD-10-CM | POA: Diagnosis not present

## 2015-11-11 ENCOUNTER — Ambulatory Visit: Payer: Medicare Other

## 2016-02-18 DIAGNOSIS — I10 Essential (primary) hypertension: Secondary | ICD-10-CM | POA: Diagnosis not present

## 2016-02-18 DIAGNOSIS — E7800 Pure hypercholesterolemia, unspecified: Secondary | ICD-10-CM | POA: Diagnosis not present

## 2016-02-18 DIAGNOSIS — Q7132 Congenital absence of left hand and finger: Secondary | ICD-10-CM | POA: Diagnosis not present

## 2016-02-18 DIAGNOSIS — K219 Gastro-esophageal reflux disease without esophagitis: Secondary | ICD-10-CM | POA: Diagnosis not present

## 2016-02-18 DIAGNOSIS — F419 Anxiety disorder, unspecified: Secondary | ICD-10-CM | POA: Diagnosis not present

## 2016-02-18 DIAGNOSIS — E559 Vitamin D deficiency, unspecified: Secondary | ICD-10-CM | POA: Diagnosis not present

## 2016-03-28 DIAGNOSIS — H6120 Impacted cerumen, unspecified ear: Secondary | ICD-10-CM | POA: Diagnosis not present

## 2016-03-28 DIAGNOSIS — B3789 Other sites of candidiasis: Secondary | ICD-10-CM | POA: Diagnosis not present

## 2016-03-28 DIAGNOSIS — H9193 Unspecified hearing loss, bilateral: Secondary | ICD-10-CM | POA: Diagnosis not present

## 2016-03-28 DIAGNOSIS — H6121 Impacted cerumen, right ear: Secondary | ICD-10-CM | POA: Diagnosis not present

## 2016-06-21 DIAGNOSIS — M5442 Lumbago with sciatica, left side: Secondary | ICD-10-CM | POA: Diagnosis not present

## 2016-06-21 DIAGNOSIS — M5136 Other intervertebral disc degeneration, lumbar region: Secondary | ICD-10-CM | POA: Diagnosis not present

## 2016-06-21 DIAGNOSIS — M9905 Segmental and somatic dysfunction of pelvic region: Secondary | ICD-10-CM | POA: Diagnosis not present

## 2016-06-21 DIAGNOSIS — M9903 Segmental and somatic dysfunction of lumbar region: Secondary | ICD-10-CM | POA: Diagnosis not present

## 2016-06-23 DIAGNOSIS — M5442 Lumbago with sciatica, left side: Secondary | ICD-10-CM | POA: Diagnosis not present

## 2016-06-23 DIAGNOSIS — M5136 Other intervertebral disc degeneration, lumbar region: Secondary | ICD-10-CM | POA: Diagnosis not present

## 2016-06-23 DIAGNOSIS — M9905 Segmental and somatic dysfunction of pelvic region: Secondary | ICD-10-CM | POA: Diagnosis not present

## 2016-06-23 DIAGNOSIS — M9903 Segmental and somatic dysfunction of lumbar region: Secondary | ICD-10-CM | POA: Diagnosis not present

## 2016-06-28 DIAGNOSIS — M5136 Other intervertebral disc degeneration, lumbar region: Secondary | ICD-10-CM | POA: Diagnosis not present

## 2016-06-28 DIAGNOSIS — M9903 Segmental and somatic dysfunction of lumbar region: Secondary | ICD-10-CM | POA: Diagnosis not present

## 2016-06-28 DIAGNOSIS — M5442 Lumbago with sciatica, left side: Secondary | ICD-10-CM | POA: Diagnosis not present

## 2016-06-28 DIAGNOSIS — M9905 Segmental and somatic dysfunction of pelvic region: Secondary | ICD-10-CM | POA: Diagnosis not present

## 2016-06-30 DIAGNOSIS — M9913 Subluxation complex (vertebral) of lumbar region: Secondary | ICD-10-CM | POA: Diagnosis not present

## 2016-06-30 DIAGNOSIS — M9903 Segmental and somatic dysfunction of lumbar region: Secondary | ICD-10-CM | POA: Diagnosis not present

## 2016-06-30 DIAGNOSIS — M5136 Other intervertebral disc degeneration, lumbar region: Secondary | ICD-10-CM | POA: Diagnosis not present

## 2016-06-30 DIAGNOSIS — M5442 Lumbago with sciatica, left side: Secondary | ICD-10-CM | POA: Diagnosis not present

## 2016-07-05 DIAGNOSIS — M9905 Segmental and somatic dysfunction of pelvic region: Secondary | ICD-10-CM | POA: Diagnosis not present

## 2016-07-05 DIAGNOSIS — M5136 Other intervertebral disc degeneration, lumbar region: Secondary | ICD-10-CM | POA: Diagnosis not present

## 2016-07-05 DIAGNOSIS — M5442 Lumbago with sciatica, left side: Secondary | ICD-10-CM | POA: Diagnosis not present

## 2016-07-05 DIAGNOSIS — M9903 Segmental and somatic dysfunction of lumbar region: Secondary | ICD-10-CM | POA: Diagnosis not present

## 2016-07-07 DIAGNOSIS — M9903 Segmental and somatic dysfunction of lumbar region: Secondary | ICD-10-CM | POA: Diagnosis not present

## 2016-07-07 DIAGNOSIS — M5442 Lumbago with sciatica, left side: Secondary | ICD-10-CM | POA: Diagnosis not present

## 2016-07-07 DIAGNOSIS — M9905 Segmental and somatic dysfunction of pelvic region: Secondary | ICD-10-CM | POA: Diagnosis not present

## 2016-07-07 DIAGNOSIS — M5136 Other intervertebral disc degeneration, lumbar region: Secondary | ICD-10-CM | POA: Diagnosis not present

## 2016-07-14 DIAGNOSIS — M5136 Other intervertebral disc degeneration, lumbar region: Secondary | ICD-10-CM | POA: Diagnosis not present

## 2016-07-14 DIAGNOSIS — M9905 Segmental and somatic dysfunction of pelvic region: Secondary | ICD-10-CM | POA: Diagnosis not present

## 2016-07-14 DIAGNOSIS — M9903 Segmental and somatic dysfunction of lumbar region: Secondary | ICD-10-CM | POA: Diagnosis not present

## 2016-07-14 DIAGNOSIS — M5442 Lumbago with sciatica, left side: Secondary | ICD-10-CM | POA: Diagnosis not present

## 2016-08-02 DIAGNOSIS — M5136 Other intervertebral disc degeneration, lumbar region: Secondary | ICD-10-CM | POA: Diagnosis not present

## 2016-08-02 DIAGNOSIS — M5442 Lumbago with sciatica, left side: Secondary | ICD-10-CM | POA: Diagnosis not present

## 2016-08-02 DIAGNOSIS — M9903 Segmental and somatic dysfunction of lumbar region: Secondary | ICD-10-CM | POA: Diagnosis not present

## 2016-08-02 DIAGNOSIS — M9905 Segmental and somatic dysfunction of pelvic region: Secondary | ICD-10-CM | POA: Diagnosis not present

## 2016-08-04 DIAGNOSIS — M9903 Segmental and somatic dysfunction of lumbar region: Secondary | ICD-10-CM | POA: Diagnosis not present

## 2016-08-04 DIAGNOSIS — M9905 Segmental and somatic dysfunction of pelvic region: Secondary | ICD-10-CM | POA: Diagnosis not present

## 2016-08-04 DIAGNOSIS — M5136 Other intervertebral disc degeneration, lumbar region: Secondary | ICD-10-CM | POA: Diagnosis not present

## 2016-08-04 DIAGNOSIS — M5442 Lumbago with sciatica, left side: Secondary | ICD-10-CM | POA: Diagnosis not present

## 2016-08-17 DIAGNOSIS — I10 Essential (primary) hypertension: Secondary | ICD-10-CM | POA: Diagnosis not present

## 2016-08-17 DIAGNOSIS — E7800 Pure hypercholesterolemia, unspecified: Secondary | ICD-10-CM | POA: Diagnosis not present

## 2016-08-17 DIAGNOSIS — E559 Vitamin D deficiency, unspecified: Secondary | ICD-10-CM | POA: Diagnosis not present

## 2016-08-17 DIAGNOSIS — Z23 Encounter for immunization: Secondary | ICD-10-CM | POA: Diagnosis not present

## 2016-08-17 DIAGNOSIS — F419 Anxiety disorder, unspecified: Secondary | ICD-10-CM | POA: Diagnosis not present

## 2016-08-17 DIAGNOSIS — K219 Gastro-esophageal reflux disease without esophagitis: Secondary | ICD-10-CM | POA: Diagnosis not present

## 2016-09-06 DIAGNOSIS — M5136 Other intervertebral disc degeneration, lumbar region: Secondary | ICD-10-CM | POA: Diagnosis not present

## 2016-09-06 DIAGNOSIS — M9903 Segmental and somatic dysfunction of lumbar region: Secondary | ICD-10-CM | POA: Diagnosis not present

## 2016-09-06 DIAGNOSIS — M9905 Segmental and somatic dysfunction of pelvic region: Secondary | ICD-10-CM | POA: Diagnosis not present

## 2016-09-06 DIAGNOSIS — M5442 Lumbago with sciatica, left side: Secondary | ICD-10-CM | POA: Diagnosis not present

## 2016-09-08 DIAGNOSIS — M5136 Other intervertebral disc degeneration, lumbar region: Secondary | ICD-10-CM | POA: Diagnosis not present

## 2016-09-08 DIAGNOSIS — M5442 Lumbago with sciatica, left side: Secondary | ICD-10-CM | POA: Diagnosis not present

## 2016-09-08 DIAGNOSIS — M9903 Segmental and somatic dysfunction of lumbar region: Secondary | ICD-10-CM | POA: Diagnosis not present

## 2016-09-08 DIAGNOSIS — M9905 Segmental and somatic dysfunction of pelvic region: Secondary | ICD-10-CM | POA: Diagnosis not present

## 2016-09-09 DIAGNOSIS — L255 Unspecified contact dermatitis due to plants, except food: Secondary | ICD-10-CM | POA: Diagnosis not present

## 2016-09-15 DIAGNOSIS — M9903 Segmental and somatic dysfunction of lumbar region: Secondary | ICD-10-CM | POA: Diagnosis not present

## 2016-09-15 DIAGNOSIS — M5442 Lumbago with sciatica, left side: Secondary | ICD-10-CM | POA: Diagnosis not present

## 2016-09-15 DIAGNOSIS — M9905 Segmental and somatic dysfunction of pelvic region: Secondary | ICD-10-CM | POA: Diagnosis not present

## 2016-09-15 DIAGNOSIS — M5136 Other intervertebral disc degeneration, lumbar region: Secondary | ICD-10-CM | POA: Diagnosis not present

## 2016-09-29 DIAGNOSIS — M9903 Segmental and somatic dysfunction of lumbar region: Secondary | ICD-10-CM | POA: Diagnosis not present

## 2016-09-29 DIAGNOSIS — M9905 Segmental and somatic dysfunction of pelvic region: Secondary | ICD-10-CM | POA: Diagnosis not present

## 2016-09-29 DIAGNOSIS — M5442 Lumbago with sciatica, left side: Secondary | ICD-10-CM | POA: Diagnosis not present

## 2016-09-29 DIAGNOSIS — M5136 Other intervertebral disc degeneration, lumbar region: Secondary | ICD-10-CM | POA: Diagnosis not present

## 2016-10-13 DIAGNOSIS — M5030 Other cervical disc degeneration, unspecified cervical region: Secondary | ICD-10-CM | POA: Diagnosis not present

## 2016-10-13 DIAGNOSIS — M47812 Spondylosis without myelopathy or radiculopathy, cervical region: Secondary | ICD-10-CM | POA: Diagnosis not present

## 2016-10-13 DIAGNOSIS — M9901 Segmental and somatic dysfunction of cervical region: Secondary | ICD-10-CM | POA: Diagnosis not present

## 2016-10-13 DIAGNOSIS — M542 Cervicalgia: Secondary | ICD-10-CM | POA: Diagnosis not present

## 2016-10-25 DIAGNOSIS — M542 Cervicalgia: Secondary | ICD-10-CM | POA: Diagnosis not present

## 2016-10-25 DIAGNOSIS — M47812 Spondylosis without myelopathy or radiculopathy, cervical region: Secondary | ICD-10-CM | POA: Diagnosis not present

## 2016-10-25 DIAGNOSIS — M5030 Other cervical disc degeneration, unspecified cervical region: Secondary | ICD-10-CM | POA: Diagnosis not present

## 2016-10-25 DIAGNOSIS — M9901 Segmental and somatic dysfunction of cervical region: Secondary | ICD-10-CM | POA: Diagnosis not present

## 2016-10-26 ENCOUNTER — Other Ambulatory Visit: Payer: Self-pay | Admitting: Family Medicine

## 2016-10-26 DIAGNOSIS — Z1231 Encounter for screening mammogram for malignant neoplasm of breast: Secondary | ICD-10-CM

## 2016-11-10 DIAGNOSIS — M47812 Spondylosis without myelopathy or radiculopathy, cervical region: Secondary | ICD-10-CM | POA: Diagnosis not present

## 2016-11-10 DIAGNOSIS — M542 Cervicalgia: Secondary | ICD-10-CM | POA: Diagnosis not present

## 2016-11-10 DIAGNOSIS — M5030 Other cervical disc degeneration, unspecified cervical region: Secondary | ICD-10-CM | POA: Diagnosis not present

## 2016-11-10 DIAGNOSIS — M9901 Segmental and somatic dysfunction of cervical region: Secondary | ICD-10-CM | POA: Diagnosis not present

## 2016-11-21 ENCOUNTER — Encounter (HOSPITAL_COMMUNITY): Payer: Self-pay

## 2016-11-21 ENCOUNTER — Emergency Department (HOSPITAL_COMMUNITY)
Admission: EM | Admit: 2016-11-21 | Discharge: 2016-11-21 | Disposition: A | Discharge status: Home / Self Care | Payer: Medicare Other | Attending: Emergency Medicine | Admitting: Emergency Medicine

## 2016-11-21 ENCOUNTER — Emergency Department (HOSPITAL_COMMUNITY): Payer: Medicare Other

## 2016-11-21 DIAGNOSIS — Y999 Unspecified external cause status: Secondary | ICD-10-CM | POA: Diagnosis not present

## 2016-11-21 DIAGNOSIS — W19XXXA Unspecified fall, initial encounter: Secondary | ICD-10-CM

## 2016-11-21 DIAGNOSIS — R42 Dizziness and giddiness: Secondary | ICD-10-CM | POA: Insufficient documentation

## 2016-11-21 DIAGNOSIS — Y939 Activity, unspecified: Secondary | ICD-10-CM | POA: Insufficient documentation

## 2016-11-21 DIAGNOSIS — Y929 Unspecified place or not applicable: Secondary | ICD-10-CM | POA: Diagnosis not present

## 2016-11-21 DIAGNOSIS — I10 Essential (primary) hypertension: Secondary | ICD-10-CM | POA: Diagnosis not present

## 2016-11-21 DIAGNOSIS — W000XXA Fall on same level due to ice and snow, initial encounter: Secondary | ICD-10-CM | POA: Insufficient documentation

## 2016-11-21 DIAGNOSIS — S0101XA Laceration without foreign body of scalp, initial encounter: Secondary | ICD-10-CM | POA: Insufficient documentation

## 2016-11-21 DIAGNOSIS — S0993XA Unspecified injury of face, initial encounter: Secondary | ICD-10-CM | POA: Diagnosis not present

## 2016-11-21 DIAGNOSIS — S0003XA Contusion of scalp, initial encounter: Secondary | ICD-10-CM | POA: Diagnosis not present

## 2016-11-21 DIAGNOSIS — S199XXA Unspecified injury of neck, initial encounter: Secondary | ICD-10-CM | POA: Diagnosis not present

## 2016-11-21 DIAGNOSIS — S098XXA Other specified injuries of head, initial encounter: Secondary | ICD-10-CM | POA: Diagnosis not present

## 2016-11-21 DIAGNOSIS — M542 Cervicalgia: Secondary | ICD-10-CM | POA: Diagnosis not present

## 2016-11-21 HISTORY — DX: Essential (primary) hypertension: I10

## 2016-11-21 MED ORDER — ACETAMINOPHEN 325 MG PO TABS
325.0000 mg | ORAL_TABLET | Freq: Once | ORAL | Status: AC
  Administered 2016-11-21: 325 mg via ORAL
  Filled 2016-11-21: qty 1

## 2016-11-21 NOTE — ED Notes (Signed)
Placed patient on the monitor did vitals signs on patient

## 2016-11-21 NOTE — ED Notes (Signed)
PT has no requests at this time. PT aware that CT results are back and that EDP will be with her shortly

## 2016-11-21 NOTE — ED Triage Notes (Signed)
Per EMS - pt from home. Pt slipped on ice, hit posterior head. Small laceration to posterior head, bleeding controlled. Denies any pain in neck/spine. Precautionary c-collar.

## 2016-11-21 NOTE — ED Notes (Signed)
ED Provider at bedside. 

## 2016-11-21 NOTE — Discharge Instructions (Signed)
Please follow-up with your primary care physician in 2-5 days. Apply ice to the area for 15 minutes 3 times a day. Take Tylenol as needed for pain.  Get help right away if: You have: A severe headache that is not helped by medicine. Trouble walking, have weakness in your arms and legs, or lose your balance. Clear or bloody fluid coming from your nose or ears. Changes in your vision. A seizure. You vomit. Your symptoms get worse. Your speech is slurred. You pass out. You are sleepier and have trouble staying awake. Your pupils change size.

## 2016-11-21 NOTE — ED Provider Notes (Signed)
[facility]-EMERGENCY DEPT Provider Note   CSN: [id number] Arrival date & time: 11/21/16  1051     History   Chief Complaint Chief Complaint  Patient presents with  . Fall    HPI Elizabeth [name] is a 75 y.o. female presents to the ED following a fall on ice 30 minutes PTA. She reports falling back and hitting the back of her head. She reports having a laceration to the back of her head, painful, tender to palpation, and bleeding is controlled. Patient denies neck pain or neck stiffness. She denies LOC, vomiting, nausea, Fevers, chills, chest pain, shortness of breath. She denies taking any blood thinners. She denies any previous history of falls, hitting her head, or any pathologies with her head or spine.  HPI  Past Medical History:  Diagnosis Date  . Hypertension     There are no active problems to display for this patient.   No past surgical history on file.  OB History    No data available       Home Medications    Prior to Admission medications   Medication Sig Start Date End Date Taking? Authorizing Provider  bisoprolol-hydrochlorothiazide (ZIAC) 10-6.25 MG tablet Take 1 tablet by mouth daily. 11/08/16  Yes Historical Provider, MD  ibuprofen (ADVIL,MOTRIN) 200 MG tablet Take 200 mg by mouth every 6 (six) hours as needed for mild pain.   Yes Historical Provider, MD  lovastatin (MEVACOR) 40 MG tablet Take 40 mg by mouth at bedtime. 10/05/16  Yes Historical Provider, MD  nortriptyline (PAMELOR) 75 MG capsule Take 150 mg by mouth at bedtime. 11/07/16  Yes Historical Provider, MD    Family History No family history on file.  Social History Social History  Substance Use Topics  . Smoking status: Never Smoker  . Smokeless tobacco: Never Used  . Alcohol use No     Allergies   Patient has no known allergies.   Review of Systems Review of Systems  Constitutional: Negative for chills and fever.  Eyes: Negative for photophobia and visual disturbance.    Respiratory: Negative for shortness of breath.   Cardiovascular: Negative for chest pain.  Gastrointestinal: Negative for abdominal pain, constipation, diarrhea, nausea and vomiting.  Genitourinary: Negative for difficulty urinating and dysuria.  Musculoskeletal: Negative for back pain, neck pain and neck stiffness.       Laceration to scalp. Bleeding controlled. Tender to palpation  Birth defect. Left arm ends at distal radius and ulna. No left hand.    Neurological: Positive for dizziness and headaches. Negative for speech difficulty and numbness.  Hematological: Does not bruise/bleed easily.     Physical Exam Updated Vital Signs BP 153/56 (BP Location: Right Arm)   Pulse 62   Temp 97.5 F (36.4 C) (Oral)   Resp 16   SpO2 100%   Physical Exam  Constitutional: She is oriented to person, place, and time. She appears well-developed and well-nourished.  HENT:  Head: Normocephalic.  Nose: Nose normal.  Laceration to back of scalp. Bleeding controlled. Dry blood ran down to back of neck. Laceration is TTP, with no surrounding erythema.  Eyes: Conjunctivae and EOM are normal. Pupils are equal, round, and reactive to light.  Neck: Normal range of motion. Neck supple.  Cardiovascular: Normal rate and normal heart sounds.   Pulmonary/Chest: Effort normal and breath sounds normal. No respiratory distress. She exhibits no tenderness.  Abdominal: Soft. Bowel sounds are normal. There is no tenderness. There is no rebound and no guarding.  Musculoskeletal:  Normal range of motion.  Birth defect. Left arm ends at distal radius and ulna. No left hand.   Neurological: She is alert and oriented to person, place, and time.  Skin: Skin is warm. Capillary refill takes less than 2 seconds.  Psychiatric: She has a normal mood and affect. Her behavior is normal.  Nursing note and vitals reviewed.    ED Treatments / Results  Labs (all labs ordered are listed, but only abnormal results are  displayed) Labs Reviewed - No data to display  EKG  EKG Interpretation None       Radiology Ct Head Wo Contrast  Result Date: 11/21/2016 CLINICAL DATA:  Fall this morning. Posterior head and neck pain. Initial encounter. EXAM: CT HEAD WITHOUT CONTRAST CT CERVICAL SPINE WITHOUT CONTRAST TECHNIQUE: Multidetector CT imaging of the head and cervical spine was performed following the standard protocol without intravenous contrast. Multiplanar CT image reconstructions of the cervical spine were also generated. COMPARISON:  None. FINDINGS: CT HEAD FINDINGS Brain: No evidence of acute infarction, hemorrhage, hydrocephalus, extra-axial collection or mass lesion/mass effect. Mild generalized cerebral atrophy and chronic small vessel disease are noted. Vascular: No hyperdense vessel or unexpected calcification. Skull: Normal. Negative for fracture or focal lesion. Sinuses/Orbits: No acute finding. Other: Small to moderate posterior parietal scalp hematoma. CT CERVICAL SPINE FINDINGS Alignment: Normal. Skull base and vertebrae: No acute fracture. No primary bone lesion or focal pathologic process. Soft tissues and spinal canal: No prevertebral fluid or swelling. No visible canal hematoma. Disc levels: Mature anterior cervical disc fusion seen at C5-6 and C6-7. Mild to moderate degenerative disc disease seen at C3-4 and C4-5. Mild to moderate facet DJD also seen bilaterally at C3-4 and C4-5. Mild atlantoaxial degenerative changes also noted. Upper chest: Negative. Other: None. IMPRESSION: Posterior parietal scalp hematoma. No evidence of skull fracture or acute intracranial abnormality. Mild generalized cerebral atrophy and chronic small vessel disease. No evidence of acute cervical spine fracture or subluxation. Mature anterior disc fusions at C5-6 and C6-7. Degenerative spondylosis, as described above. Electronically Signed   By: Myles RosenthalJohn  Stahl M.D.   On: 11/21/2016 12:53   Ct Cervical Spine Wo Contrast  Result  Date: 11/21/2016 CLINICAL DATA:  Fall this morning. Posterior head and neck pain. Initial encounter. EXAM: CT HEAD WITHOUT CONTRAST CT CERVICAL SPINE WITHOUT CONTRAST TECHNIQUE: Multidetector CT imaging of the head and cervical spine was performed following the standard protocol without intravenous contrast. Multiplanar CT image reconstructions of the cervical spine were also generated. COMPARISON:  None. FINDINGS: CT HEAD FINDINGS Brain: No evidence of acute infarction, hemorrhage, hydrocephalus, extra-axial collection or mass lesion/mass effect. Mild generalized cerebral atrophy and chronic small vessel disease are noted. Vascular: No hyperdense vessel or unexpected calcification. Skull: Normal. Negative for fracture or focal lesion. Sinuses/Orbits: No acute finding. Other: Small to moderate posterior parietal scalp hematoma. CT CERVICAL SPINE FINDINGS Alignment: Normal. Skull base and vertebrae: No acute fracture. No primary bone lesion or focal pathologic process. Soft tissues and spinal canal: No prevertebral fluid or swelling. No visible canal hematoma. Disc levels: Mature anterior cervical disc fusion seen at C5-6 and C6-7. Mild to moderate degenerative disc disease seen at C3-4 and C4-5. Mild to moderate facet DJD also seen bilaterally at C3-4 and C4-5. Mild atlantoaxial degenerative changes also noted. Upper chest: Negative. Other: None. IMPRESSION: Posterior parietal scalp hematoma. No evidence of skull fracture or acute intracranial abnormality. Mild generalized cerebral atrophy and chronic small vessel disease. No evidence of acute cervical spine fracture or subluxation.  Mature anterior disc fusions at C5-6 and C6-7. Degenerative spondylosis, as described above. Electronically Signed   By: Myles Rosenthal M.D.   On: 11/21/2016 12:53    Procedures Procedures (including critical care time)  Medications Ordered in ED Medications  acetaminophen (TYLENOL) tablet 325 mg (325 mg Oral Given 11/21/16 1337)       Initial Impression / Assessment and Plan / ED Course  I have reviewed the triage vital signs and the nursing notes.  Pertinent labs & imaging results that were available during my care of the patient were reviewed by me and considered in my medical decision making (see chart for details).  Clinical Course   Patient is a 75 year old female who presents to the ED after a fall earlier today. She fell backwards and hit the back of her scalp. Patient denies LOC, nausea, vomiting, visual disturbances or changes. On exam patient NAD, afebrile, and hemodynamically stable. Bleeding controlled prior to arrival. Area tender to palpation. No central spinal tenderness or paraspinal tenderness. Neuro exam negative. Patient able to stand and ambulate. CT scan negative for skull fracture or acute intracranial abnormality. CT spine negative for acute cervical spine fracture or subluxation. Cleaned area off with normal saline and did not notice a large enough laceration that would need or warrant stapling. Gauze dressing placed. Patient is afebrile hemodynamically stable and in no apparent distress. I feel safe to discharge at this time. Patient agreed with assessment and plan. Patient understood the instructions. Patient told to follow up with her primary care physician in 2-5 days. Return precautions given for any new or worsening symptoms such as fever, chills, visual changes, vomiting, severe headache.  Final Clinical Impressions(s) / ED Diagnoses   Final diagnoses:  Laceration of scalp, initial encounter  Fall, initial encounter    New Prescriptions Discharge Medication List as of 11/21/2016  1:32 PM       [address] [place], [place] 11/21/16 2107    Raeford Razor, MD 11/29/16 1051

## 2016-11-23 DIAGNOSIS — S0003XA Contusion of scalp, initial encounter: Secondary | ICD-10-CM | POA: Diagnosis not present

## 2016-11-23 DIAGNOSIS — S060X0A Concussion without loss of consciousness, initial encounter: Secondary | ICD-10-CM | POA: Diagnosis not present

## 2016-11-23 DIAGNOSIS — G44319 Acute post-traumatic headache, not intractable: Secondary | ICD-10-CM | POA: Diagnosis not present

## 2016-12-19 DIAGNOSIS — R51 Headache: Secondary | ICD-10-CM | POA: Diagnosis not present

## 2016-12-19 DIAGNOSIS — R296 Repeated falls: Secondary | ICD-10-CM | POA: Diagnosis not present

## 2016-12-19 DIAGNOSIS — R410 Disorientation, unspecified: Secondary | ICD-10-CM | POA: Diagnosis not present

## 2016-12-19 DIAGNOSIS — F419 Anxiety disorder, unspecified: Secondary | ICD-10-CM | POA: Diagnosis not present

## 2016-12-19 DIAGNOSIS — R319 Hematuria, unspecified: Secondary | ICD-10-CM | POA: Diagnosis not present

## 2016-12-19 DIAGNOSIS — S060X0D Concussion without loss of consciousness, subsequent encounter: Secondary | ICD-10-CM | POA: Diagnosis not present

## 2016-12-19 DIAGNOSIS — N390 Urinary tract infection, site not specified: Secondary | ICD-10-CM | POA: Diagnosis not present

## 2016-12-19 DIAGNOSIS — G44319 Acute post-traumatic headache, not intractable: Secondary | ICD-10-CM | POA: Diagnosis not present

## 2016-12-19 DIAGNOSIS — R55 Syncope and collapse: Secondary | ICD-10-CM | POA: Diagnosis not present

## 2016-12-19 DIAGNOSIS — I10 Essential (primary) hypertension: Secondary | ICD-10-CM | POA: Diagnosis not present

## 2016-12-20 ENCOUNTER — Other Ambulatory Visit: Payer: Self-pay | Admitting: Family Medicine

## 2016-12-20 DIAGNOSIS — R55 Syncope and collapse: Secondary | ICD-10-CM

## 2016-12-20 DIAGNOSIS — G44319 Acute post-traumatic headache, not intractable: Secondary | ICD-10-CM | POA: Diagnosis not present

## 2016-12-20 DIAGNOSIS — N390 Urinary tract infection, site not specified: Secondary | ICD-10-CM | POA: Diagnosis not present

## 2016-12-23 ENCOUNTER — Ambulatory Visit
Admission: RE | Admit: 2016-12-23 | Discharge: 2016-12-23 | Disposition: A | Discharge status: Home / Self Care | Payer: Medicare Other | Source: Ambulatory Visit | Attending: Family Medicine | Admitting: Family Medicine

## 2016-12-23 DIAGNOSIS — R55 Syncope and collapse: Secondary | ICD-10-CM

## 2016-12-23 DIAGNOSIS — R51 Headache: Secondary | ICD-10-CM | POA: Diagnosis not present

## 2017-01-12 DIAGNOSIS — N390 Urinary tract infection, site not specified: Secondary | ICD-10-CM | POA: Diagnosis not present

## 2017-01-12 DIAGNOSIS — R296 Repeated falls: Secondary | ICD-10-CM | POA: Diagnosis not present

## 2017-01-12 DIAGNOSIS — G44319 Acute post-traumatic headache, not intractable: Secondary | ICD-10-CM | POA: Diagnosis not present

## 2017-01-23 ENCOUNTER — Ambulatory Visit: Payer: Medicare Other | Admitting: Neurology

## 2017-02-06 ENCOUNTER — Encounter: Payer: Self-pay | Admitting: Neurology

## 2017-02-06 ENCOUNTER — Ambulatory Visit (INDEPENDENT_AMBULATORY_CARE_PROVIDER_SITE_OTHER): Payer: Medicare Other | Admitting: Neurology

## 2017-02-06 VITALS — BP 152/72 | HR 65 | Ht 63.0 in | Wt 127.5 lb

## 2017-02-06 DIAGNOSIS — R413 Other amnesia: Secondary | ICD-10-CM

## 2017-02-06 DIAGNOSIS — R55 Syncope and collapse: Secondary | ICD-10-CM | POA: Diagnosis not present

## 2017-02-06 DIAGNOSIS — E638 Other specified nutritional deficiencies: Secondary | ICD-10-CM | POA: Diagnosis not present

## 2017-02-06 DIAGNOSIS — IMO0002 Reserved for concepts with insufficient information to code with codable children: Secondary | ICD-10-CM

## 2017-02-06 DIAGNOSIS — R52 Pain, unspecified: Secondary | ICD-10-CM | POA: Diagnosis not present

## 2017-02-06 MED ORDER — DIAZEPAM 5 MG PO TABS: ORAL_TABLET | ORAL | 0 refills | Status: DC
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
## 2017-02-06 NOTE — Patient Instructions (Signed)
No driving until episode free for 6 months 

## 2017-02-07 LAB — COMPREHENSIVE METABOLIC PANEL
ALT: 12 IU/L (ref 0–32)
AST: 15 IU/L (ref 0–40)
Albumin/Globulin Ratio: 1.8 (ref 1.2–2.2)
Albumin: 4.3 g/dL (ref 3.5–4.8)
Alkaline Phosphatase: 65 IU/L (ref 39–117)
BUN/Creatinine Ratio: 15 (ref 12–28)
BUN: 13 mg/dL (ref 8–27)
Bilirubin Total: <0.2 mg/dL (ref 0.0–1.2)
CO2: 26 mmol/L (ref 18–29)
Calcium: 9.7 mg/dL (ref 8.7–10.3)
Chloride: 98 mmol/L (ref 96–106)
Creatinine, Ser: 0.88 mg/dL (ref 0.57–1.00)
GFR calc Af Amer: 73 mL/min/{1.73_m2} (ref >59)
GFR calc non Af Amer: 64 mL/min/{1.73_m2} (ref >59)
Globulin, Total: 2.4 g/dL (ref 1.5–4.5)
Glucose: 99 mg/dL (ref 65–99)
Potassium: 4.1 mmol/L (ref 3.5–5.2)
Sodium: 141 mmol/L (ref 134–144)
Total Protein: 6.7 g/dL (ref 6.0–8.5)

## 2017-02-07 LAB — CBC
Hematocrit: 37.2 % (ref 34.0–46.6)
Hemoglobin: 12.4 g/dL (ref 11.1–15.9)
MCH: 27.4 pg (ref 26.6–33.0)
MCHC: 33.3 g/dL (ref 31.5–35.7)
MCV: 82 fL (ref 79–97)
Platelets: 269 10*3/uL (ref 150–379)
RBC: 4.53 x10E6/uL (ref 3.77–5.28)
RDW: 14.9 % (ref 12.3–15.4)
WBC: 6.5 10*3/uL (ref 3.4–10.8)

## 2017-02-07 LAB — VITAMIN B12: Vitamin B-12: 327 pg/mL (ref 232–1245)

## 2017-02-07 LAB — TSH: TSH: 4.51 u[IU]/mL — ABNORMAL HIGH (ref 0.450–4.500)

## 2017-02-13 ENCOUNTER — Ambulatory Visit (INDEPENDENT_AMBULATORY_CARE_PROVIDER_SITE_OTHER): Payer: Medicare Other | Admitting: Neurology

## 2017-02-13 DIAGNOSIS — R55 Syncope and collapse: Secondary | ICD-10-CM | POA: Diagnosis not present

## 2017-02-13 DIAGNOSIS — IMO0002 Reserved for concepts with insufficient information to code with codable children: Secondary | ICD-10-CM

## 2017-02-24 ENCOUNTER — Other Ambulatory Visit: Payer: Medicare Other

## 2017-03-03 NOTE — Procedures (Signed)
   HISTORY: 76 years old with memory loss, intermittent confusion episodes. TECHNIQUE:  16 channel EEG was performed based on standard 10-16 international system. One channel was dedicated to EKG, which has demonstrates normal sinus rhythm of 66 beats per minutes.  Upon awakening, the posterior background activity was well-developed, in alpha range,reactive to eye opening and closure. There was frequent motion and electrode artifact  There was no evidence of epileptiform discharge.  Photic stimulation was performed, which induced a symmetric photic driving.  Hyperventilation was performed, there was no abnormality elicit.  No sleep was achieved.  CONCLUSION: This is a  normal awake EEG.  There is no electrodiagnostic evidence of epileptiform discharge.  Levert FeinsteinYijun Ankita Newcomer, M.D. Ph.D.  [facility] [address] [place], [place] [address] Phone: [phone] Fax:      [phone]

## 2017-03-08 ENCOUNTER — Telehealth: Payer: Self-pay | Admitting: Neurology

## 2017-03-08 NOTE — Telephone Encounter (Signed)
Patient calling to get directions for taking medication diazepam (VALIUM) 5 MG tablet before her MRI.

## 2017-03-08 NOTE — Telephone Encounter (Signed)
She will take one tablet 30 minutes prior to scan and repeat with the additional tablet, if needed, prior to entering scanner.  She is aware that she must have a driver.

## 2017-03-10 ENCOUNTER — Ambulatory Visit
Admission: RE | Admit: 2017-03-10 | Discharge: 2017-03-10 | Disposition: A | Discharge status: Home / Self Care | Payer: Medicare Other | Source: Ambulatory Visit | Attending: Neurology | Admitting: Neurology

## 2017-03-10 DIAGNOSIS — IMO0002 Reserved for concepts with insufficient information to code with codable children: Secondary | ICD-10-CM

## 2017-03-10 DIAGNOSIS — R55 Syncope and collapse: Secondary | ICD-10-CM | POA: Diagnosis not present

## 2017-03-10 MED ORDER — GADOBENATE DIMEGLUMINE 529 MG/ML IV SOLN
11.0000 mL | Freq: Once | INTRAVENOUS | Status: AC | PRN
  Administered 2017-03-10: 11 mL via INTRAVENOUS

## 2017-03-14 ENCOUNTER — Telehealth: Payer: Self-pay | Admitting: Neurology

## 2017-03-14 NOTE — Telephone Encounter (Signed)
Please call patient, MRI of the brain showed generalized atrophy, supratentorium small vessel disease, no acute abnormality.  I will review films with her in detail at follow-up visit  IMPRESSION:  Abnormal MRI scan of brain showing changes off chronic microvascular ischemia and generalized cerebral atrophy. No acute abnormality is noted.A small remote age lacunar infarct is noted in the right basal ganglia

## 2017-03-14 NOTE — Telephone Encounter (Signed)
Spoke to her daughter, Victorino Dike (on HIPAA) - she is aware of results.  They will keep her follow up for further review.

## 2017-04-07 DIAGNOSIS — H9193 Unspecified hearing loss, bilateral: Secondary | ICD-10-CM | POA: Diagnosis not present

## 2017-04-07 DIAGNOSIS — I10 Essential (primary) hypertension: Secondary | ICD-10-CM | POA: Diagnosis not present

## 2017-04-07 DIAGNOSIS — F321 Major depressive disorder, single episode, moderate: Secondary | ICD-10-CM | POA: Diagnosis not present

## 2017-04-07 DIAGNOSIS — Z0000 Encounter for general adult medical examination without abnormal findings: Secondary | ICD-10-CM | POA: Diagnosis not present

## 2017-04-07 DIAGNOSIS — K219 Gastro-esophageal reflux disease without esophagitis: Secondary | ICD-10-CM | POA: Diagnosis not present

## 2017-04-07 DIAGNOSIS — E7800 Pure hypercholesterolemia, unspecified: Secondary | ICD-10-CM | POA: Diagnosis not present

## 2017-04-07 DIAGNOSIS — F419 Anxiety disorder, unspecified: Secondary | ICD-10-CM | POA: Diagnosis not present

## 2017-04-07 DIAGNOSIS — Q7132 Congenital absence of left hand and finger: Secondary | ICD-10-CM | POA: Diagnosis not present

## 2017-04-18 ENCOUNTER — Emergency Department (HOSPITAL_COMMUNITY): Payer: Medicare Other

## 2017-04-18 ENCOUNTER — Encounter (HOSPITAL_COMMUNITY): Payer: Self-pay | Admitting: Emergency Medicine

## 2017-04-18 ENCOUNTER — Emergency Department (HOSPITAL_COMMUNITY)
Admission: EM | Admit: 2017-04-18 | Discharge: 2017-04-18 | Disposition: A | Discharge status: Home / Self Care | Payer: Medicare Other | Attending: Emergency Medicine | Admitting: Emergency Medicine

## 2017-04-18 DIAGNOSIS — Z79899 Other long term (current) drug therapy: Secondary | ICD-10-CM | POA: Insufficient documentation

## 2017-04-18 DIAGNOSIS — R42 Dizziness and giddiness: Secondary | ICD-10-CM | POA: Insufficient documentation

## 2017-04-18 DIAGNOSIS — I10 Essential (primary) hypertension: Secondary | ICD-10-CM | POA: Diagnosis not present

## 2017-04-18 DIAGNOSIS — R51 Headache: Secondary | ICD-10-CM | POA: Diagnosis not present

## 2017-04-18 LAB — COMPREHENSIVE METABOLIC PANEL WITH GFR
ALT: 15 U/L (ref 14–54)
AST: 24 U/L (ref 15–41)
Albumin: 3.7 g/dL (ref 3.5–5.0)
Alkaline Phosphatase: 56 U/L (ref 38–126)
Anion gap: 9 (ref 5–15)
BUN: 8 mg/dL (ref 6–20)
CO2: 24 mmol/L (ref 22–32)
Calcium: 9.3 mg/dL (ref 8.9–10.3)
Chloride: 106 mmol/L (ref 101–111)
Creatinine, Ser: 0.95 mg/dL (ref 0.44–1.00)
GFR calc Af Amer: >60 mL/min (ref >60)
GFR calc non Af Amer: 56 mL/min — ABNORMAL LOW (ref >60)
Glucose, Bld: 110 mg/dL — ABNORMAL HIGH (ref 65–99)
Potassium: 4 mmol/L (ref 3.5–5.1)
Sodium: 139 mmol/L (ref 135–145)
Total Bilirubin: 0.4 mg/dL (ref 0.3–1.2)
Total Protein: 6.4 g/dL — ABNORMAL LOW (ref 6.5–8.1)

## 2017-04-18 LAB — CBC WITH DIFFERENTIAL/PLATELET
Basophils Absolute: 0 K/uL (ref 0.0–0.1)
Basophils Relative: 0 %
Eosinophils Absolute: 0.2 K/uL (ref 0.0–0.7)
Eosinophils Relative: 3 %
HCT: 39.7 % (ref 36.0–46.0)
Hemoglobin: 12.7 g/dL (ref 12.0–15.0)
Lymphocytes Relative: 17 %
Lymphs Abs: 1 K/uL (ref 0.7–4.0)
MCH: 27.1 pg (ref 26.0–34.0)
MCHC: 32 g/dL (ref 30.0–36.0)
MCV: 84.8 fL (ref 78.0–100.0)
Monocytes Absolute: 0.4 K/uL (ref 0.1–1.0)
Monocytes Relative: 6 %
Neutro Abs: 4.4 K/uL (ref 1.7–7.7)
Neutrophils Relative %: 74 %
Platelets: 241 K/uL (ref 150–400)
RBC: 4.68 MIL/uL (ref 3.87–5.11)
RDW: 15 % (ref 11.5–15.5)
WBC: 6.1 K/uL (ref 4.0–10.5)

## 2017-04-18 LAB — URINALYSIS, ROUTINE W REFLEX MICROSCOPIC
Bilirubin Urine: NEGATIVE
Glucose, UA: NEGATIVE mg/dL
Hgb urine dipstick: NEGATIVE
Ketones, ur: NEGATIVE mg/dL
Nitrite: NEGATIVE
Protein, ur: NEGATIVE mg/dL
Specific Gravity, Urine: 1.005 (ref 1.005–1.030)
pH: 7 (ref 5.0–8.0)

## 2017-04-18 MED ORDER — DIAZEPAM 2 MG PO TABS
2.0000 mg | ORAL_TABLET | Freq: Once | ORAL | Status: AC | PRN
  Administered 2017-04-18: 2 mg via ORAL
  Filled 2017-04-18: qty 1

## 2017-04-18 NOTE — Discharge Planning (Signed)
Elizabeth Cohnamellia Bucky Grigg, RN, BSN, [place] [phone] Pt qualifies for DME rolling walker.  DME  ordered through [facility].  [name], RN of [facility] notified to deliver rolling walker (D34) to pt room prior to D/C home.

## 2017-04-18 NOTE — ED Provider Notes (Signed)
[facility]-EMERGENCY DEPT Provider Note   CSN: [id number] Arrival date & time: 04/18/17  0848     History   Chief Complaint Chief Complaint  Patient presents with  . Dizziness  . Anxiety  . Headache    HPI TRICHA RUGGIRELLO is a 76 y.o. female.  The history is provided by the patient and medical records. No language interpreter was used.  Dizziness  Associated symptoms: headaches   Anxiety  Associated symptoms include headaches.  Headache     GEARLDENE FIORENZA is a 76 y.o. female  with a PMH of HTN, HLD, memory loss, anxiety who presents to the Emergency Department complaining of intermittent headaches since a fall hitting her head in December. She states that yesterday afternoon, she was getting into the bed and felt dizzy. She did not feel like the room was spinning around her, but was unable to truly characterize the dizzy sensation. She states that she felt as if she could not get up or walk because she may fall. This lasted approximately 1-2 hours and has since resolved. She was able to move upper and lower extremities. She denies any slurred speech or visual changes. No chest pain or shortness of breath. No urinary symptoms, but does note that she was treated for kidney infection in the last few weeks and did not have urinary symptoms at that time either. She feels at her baseline mental status now with no dizziness or headache currently. She denies any major head injury since December, but notes that she has had multiple minor falls over the last several months. Most recently, she fell in her flower garden - did not hit head or feel like she had any injury. Typically ambulates independently without assistance at home, however since her fall she has been very guarded and afraid to ambulate much. Still not using cane/walker/etc.  Came to ED after fall in December with negative CT head/c-spine. She followed up with neurology in February as she was having persistent headaches and had a  syncopal episode. Outpatient MRI was performed which showed no acute abnormalities. She did have generalized atrophy and a remote lacunar infarct. She has follow up appointment scheduled with neurology for June 4th.    Past Medical History:  Diagnosis Date  . Allergic rhinitis   . Anxiety   . Congenital defect    left arm  . Depression   . GERD (gastroesophageal reflux disease)   . Headache   . Hyperlipemia   . Hypertension   . IBS (irritable bowel syndrome)   . Insomnia   . Memory loss   . Postmenopausal   . Vitamin D deficiency     Patient Active Problem List   Diagnosis Date Noted  . Passed out 02/06/2017  . Memory loss 02/06/2017    Past Surgical History:  Procedure Laterality Date  . ABDOMINAL HYSTERECTOMY    . CATARACT EXTRACTION Bilateral   . NECK SURGERY      OB History    No data available       Home Medications    Prior to Admission medications   Medication Sig Start Date End Date Taking? Authorizing Provider  bisoprolol-hydrochlorothiazide (ZIAC) 10-6.25 MG tablet Take 1 tablet by mouth 2 (two) times daily.  11/08/16  Yes [provider]  ibuprofen (ADVIL,MOTRIN) 200 MG tablet Take 200 mg by mouth every 6 (six) hours as needed for mild pain or moderate pain.    Yes [provider]  lovastatin (MEVACOR) 40 MG tablet Take  40 mg by mouth daily. 04/07/17  Yes [provider]  nortriptyline (PAMELOR) 75 MG capsule Take 150 mg by mouth at bedtime. 11/07/16  Yes [provider]  diazepam (VALIUM) 5 MG tablet As needed for MRI Patient not taking: Reported on 04/18/2017 02/06/17   Levert FeinsteinYan, Yijun, MD    Family History Family History  Problem Relation Age of Onset  . Breast cancer Mother   . Other Father     unsure of history    Social History Social History  Substance Use Topics  . Smoking status: Never Smoker  . Smokeless tobacco: Never Used  . Alcohol use No     Allergies   Norvasc [amlodipine]; Benicar [olmesartan];  and Lisinopril   Review of Systems Review of Systems  Neurological: Positive for dizziness and headaches. Negative for speech difficulty and numbness.  All other systems reviewed and are negative.    Physical Exam Updated Vital Signs BP (!) 171/79   Pulse 88   Temp 98 F (36.7 C) (Oral)   Resp 16   Ht 5\' 3"  (1.6 m)   Wt 59 kg   SpO2 100%   BMI 23.03 kg/m   Physical Exam  Constitutional: She is oriented to person, place, and time. She appears well-developed and well-nourished. No distress.  HENT:  Head: Normocephalic and atraumatic.  Cardiovascular: Normal rate, regular rhythm and normal heart sounds.   No murmur heard. Pulmonary/Chest: Effort normal and breath sounds normal. No respiratory distress.  Abdominal: Soft. She exhibits no distension. There is no tenderness.  Musculoskeletal: She exhibits no edema.  Neurological: She is alert and oriented to person, place, and time.  Alert, oriented, thought content appropriate. Speech is clear and goal oriented, able to follow commands.  Cranial Nerves:  II:  Peripheral visual fields grossly normal, pupils equal, round, reactive to light III, IV, VI: EOM intact bilaterally, ptosis not present V,VII: smile symmetric, eyes kept closed tightly against resistance, facial light touch sensation equal VIII: hearing grossly normal IX, X: symmetric soft palate movement, uvula elevates symmetrically  XI: bilateral shoulder shrug symmetric and strong XII: midline tongue extension 5/5 muscle strength in upper and lower extremities bilaterally. 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 Right hand with good grip strength. Sensory to light touch normal in all four extremities.  Normal finger-to-nose and rapid alternating movements; no pronator drift.  Skin: Skin is warm and dry.  Nursing note and vitals reviewed.    ED Treatments / Results  Labs (all labs ordered are listed, but only abnormal results are displayed) Labs Reviewed    COMPREHENSIVE METABOLIC PANEL - Abnormal; Notable for the following:       Result Value   Glucose, Bld 110 (*)    Total Protein 6.4 (*)    GFR calc non Af Amer 56 (*)    All other components within normal limits  URINALYSIS, ROUTINE W REFLEX MICROSCOPIC - Abnormal; Notable for the following:    Color, Urine STRAW (*)    Leukocytes, UA MODERATE (*)    Bacteria, UA FEW (*)    Squamous Epithelial / LPF 0-5 (*)    All other components within normal limits  URINE CULTURE  CBC WITH DIFFERENTIAL/PLATELET    EKG  EKG Interpretation  Date/Time:  Tuesday Apr 18 2017 09:41:11 EDT Ventricular Rate:  93 PR Interval:    QRS Duration: 103 QT Interval:  372 QTC Calculation: 463 R Axis:   -16 Text Interpretation:  Normal sinus rhythm Non-specific ST-t changes  Poor R wave progression Confirmed by RAY MD, Duwayne Heck [phone]) on 04/18/2017 9:44:39 AM       Radiology Ct Head Wo Contrast  Result Date: 04/18/2017 CLINICAL DATA:  Posterior headaches and dizziness for several days EXAM: CT HEAD WITHOUT CONTRAST TECHNIQUE: Contiguous axial images were obtained from the base of the skull through the vertex without intravenous contrast. COMPARISON:  03/10/2017, 12/23/2016 FINDINGS: Brain: No evidence of acute infarction, hemorrhage, hydrocephalus, extra-axial collection or mass lesion/mass effect. Mild atrophic changes are noted. Vascular: No hyperdense vessel or unexpected calcification. Skull: Normal. Negative for fracture or focal lesion. Sinuses/Orbits: No acute finding. Other: None. IMPRESSION: No acute abnormality noted. Electronically Signed   By: Alcide Clever M.D.   On: 04/18/2017 10:43   Mr Brain Wo Contrast  Result Date: 04/18/2017 CLINICAL DATA:  Dizziness with headache. EXAM: MRI HEAD WITHOUT CONTRAST TECHNIQUE: Multiplanar, multiecho pulse sequences of the brain and surrounding structures were obtained without intravenous contrast. COMPARISON:  CT head earlier today.  CT head 11/21/2016. FINDINGS:  Brain: No evidence for acute infarction, hemorrhage, mass lesion, or extra-axial fluid. There is moderate atrophy and ventriculomegaly, accompanied by T2 and FLAIR hyperintensities throughout the white matter, consistent with small vessel disease. Scattered chronic deep white matter and deep nuclei lacunar infarcts. Vascular: Flow voids are maintained throughout the carotid, basilar, and vertebral arteries. There are no areas of chronic hemorrhage. Skull and upper cervical spine: Normal marrow signal. Sinuses/Orbits: No acute findings. Other: None. IMPRESSION: Atrophy and small vessel disease. No acute intracranial findings. No cause is seen for the reported symptoms. Electronically Signed   By: Elsie Stain M.D.   On: 04/18/2017 15:31    Procedures Procedures (including critical care time)  Medications Ordered in ED Medications  diazepam (VALIUM) tablet 2 mg (2 mg Oral Given 04/18/17 1236)     Initial Impression / Assessment and Plan / ED Course  I have reviewed the triage vital signs and the nursing notes.  Pertinent labs & imaging results that were available during my care of the patient were reviewed by me and considered in my medical decision making (see chart for details).    KAELI NICHELSON is a 76 y.o. female who presents to ED for dizziness which occurred last night, lasting 1-2 hours and now resolved. No focal neuro deficits on exam.  Also complaining of intermittent headaches since a fall 6 months ago. No focal neuro deficits appreciated on exam. Blood work reassuring. UA with moderate leukocytes and 6-30 white cells. Patient does not have urinary symptoms, however she does state that she has had UTI's in the past with no symptoms. Discussed UA with attending who recommends sending urine for culture, but hold on treating with ABX for now. CT head and MRI negative for acute abnormalities. Strongly recommended that patient get a cane or walker to help her ambulate and avoid frequent  falls. Discussed this with case management who was planning on arranging cane or walker upon discharge stable. Unfortunately, the patient declined cane or walker. I discussed at length that this will be very helpful to avoid falls and prevent further hospital visits, however she stills declines. Will have patient take scheduled appointment with neurology on June 4 and follow up with primary care. Reasons to return to the ER were discussed and all questions answered.  Patient discussed with Dr. Rosalia Hammers who agrees with treatment plan.   Final Clinical Impressions(s) / ED Diagnoses   Final diagnoses:  Dizziness    New Prescriptions New Prescriptions  No medications on file     Selby Slovacek, [place], [place] 04/18/17 1549    Margarita Grizzle, MD 04/18/17 [phone]

## 2017-04-18 NOTE — ED Notes (Signed)
Pt leaving department for MRI.

## 2017-04-18 NOTE — ED Triage Notes (Signed)
Pt c/o dizziness with headache started Sunday night-- states fell in December, hitting head-- and has never had headaches like this until she hit her head. Pain is unrelieved by OTC meds.

## 2017-04-18 NOTE — Discharge Planning (Signed)
Upon delivery of rolling walker to pt room, pt refused DME.  DME removed from room by [name], RN.

## 2017-04-18 NOTE — ED Notes (Signed)
Pt back from MRI 

## 2017-04-18 NOTE — Discharge Instructions (Signed)
It was my pleasure taking care of you today!   Please call your primary care provider today or first thing in the morning to schedule a follow-up appointment. Please keep your scheduled appointment with the neurologist in June. Please be very careful when you are walking around. We do not want you to fall again! Return to the emergency department for new or worsening symptoms, any additional concerns.

## 2017-04-19 ENCOUNTER — Telehealth: Payer: Self-pay | Admitting: Neurology

## 2017-04-19 LAB — URINE CULTURE: Culture: NO GROWTH

## 2017-04-19 NOTE — Telephone Encounter (Signed)
Chart reviewed, she will have a follow-up appointment with me on June fourth 2018

## 2017-04-19 NOTE — Telephone Encounter (Signed)
Patient called office in reference to her being unable to walk yesterday.  Patient states she went to ER and everything was okay.  Patient is able to walk today.

## 2017-04-20 NOTE — Telephone Encounter (Signed)
Spoke to patient - she is going to be seen by her PCP for this new problem.  She will keep her follow up here with Dr. Terrace ArabiaYan.  I told her we are glad to get her in earlier, if her PCP feels this is a neurological problem that needs further evaluation.

## 2017-04-20 NOTE — Telephone Encounter (Signed)
Patient called office in reference to unable to get out of her chair without help and has falled 2 times today.  Patient would just like to know what she needs to do.  Please call

## 2017-04-21 DIAGNOSIS — R29898 Other symptoms and signs involving the musculoskeletal system: Secondary | ICD-10-CM | POA: Diagnosis not present

## 2017-04-21 DIAGNOSIS — I951 Orthostatic hypotension: Secondary | ICD-10-CM | POA: Diagnosis not present

## 2017-04-24 ENCOUNTER — Telehealth: Payer: Self-pay | Admitting: Neurology

## 2017-04-24 NOTE — Telephone Encounter (Signed)
Patient will need to see MD for new problem.  She has been placed on Dr. Zannie CoveYan's schedule for 05/04/17.  Notified PCP, pt and dgt of appt time.

## 2017-04-24 NOTE — Telephone Encounter (Signed)
[name](Dr Silvestre MomentStephen Myers) is calling to see if pt can be seen sooner than her current appointment set due to an increase in numbness and risk of fall.  Pt has been put on wait list

## 2017-04-24 NOTE — Telephone Encounter (Signed)
Patients daughter called office in reference to appointment 05/04/17 with Dr. Terrace ArabiaYan she is unable to make this appointment please call to reschedule appointment.

## 2017-04-24 NOTE — Telephone Encounter (Signed)
Spoke to patient - she has been moved to an available time this week.  She will be seen 04/27/17 at 10:30am - arrival time 10:00am.

## 2017-04-27 ENCOUNTER — Ambulatory Visit: Payer: Self-pay | Admitting: Neurology

## 2017-04-30 ENCOUNTER — Observation Stay (HOSPITAL_COMMUNITY): Payer: Medicare Other

## 2017-04-30 ENCOUNTER — Inpatient Hospital Stay (HOSPITAL_COMMUNITY)
Admission: EM | Admit: 2017-04-30 | Discharge: 2017-05-04 | DRG: 312 | Disposition: A | Discharge status: Home / Self Care | Payer: Medicare Other | Attending: Internal Medicine | Admitting: Internal Medicine

## 2017-04-30 ENCOUNTER — Observation Stay (HOSPITAL_BASED_OUTPATIENT_CLINIC_OR_DEPARTMENT_OTHER): Payer: Medicare Other

## 2017-04-30 ENCOUNTER — Encounter (HOSPITAL_COMMUNITY): Payer: Self-pay | Admitting: Emergency Medicine

## 2017-04-30 DIAGNOSIS — I951 Orthostatic hypotension: Secondary | ICD-10-CM | POA: Diagnosis not present

## 2017-04-30 DIAGNOSIS — F419 Anxiety disorder, unspecified: Secondary | ICD-10-CM | POA: Diagnosis not present

## 2017-04-30 DIAGNOSIS — K589 Irritable bowel syndrome without diarrhea: Secondary | ICD-10-CM | POA: Diagnosis present

## 2017-04-30 DIAGNOSIS — R42 Dizziness and giddiness: Secondary | ICD-10-CM | POA: Diagnosis not present

## 2017-04-30 DIAGNOSIS — Z6821 Body mass index (BMI) 21.0-21.9, adult: Secondary | ICD-10-CM | POA: Diagnosis not present

## 2017-04-30 DIAGNOSIS — W19XXXA Unspecified fall, initial encounter: Secondary | ICD-10-CM

## 2017-04-30 DIAGNOSIS — R404 Transient alteration of awareness: Secondary | ICD-10-CM | POA: Diagnosis not present

## 2017-04-30 DIAGNOSIS — I5030 Unspecified diastolic (congestive) heart failure: Secondary | ICD-10-CM | POA: Diagnosis not present

## 2017-04-30 DIAGNOSIS — F329 Major depressive disorder, single episode, unspecified: Secondary | ICD-10-CM | POA: Diagnosis not present

## 2017-04-30 DIAGNOSIS — M25511 Pain in right shoulder: Secondary | ICD-10-CM | POA: Diagnosis not present

## 2017-04-30 DIAGNOSIS — E860 Dehydration: Secondary | ICD-10-CM | POA: Diagnosis present

## 2017-04-30 DIAGNOSIS — R410 Disorientation, unspecified: Secondary | ICD-10-CM

## 2017-04-30 DIAGNOSIS — E43 Unspecified severe protein-calorie malnutrition: Secondary | ICD-10-CM | POA: Diagnosis not present

## 2017-04-30 DIAGNOSIS — S59902A Unspecified injury of left elbow, initial encounter: Secondary | ICD-10-CM | POA: Diagnosis not present

## 2017-04-30 DIAGNOSIS — M25522 Pain in left elbow: Secondary | ICD-10-CM | POA: Diagnosis not present

## 2017-04-30 DIAGNOSIS — I10 Essential (primary) hypertension: Secondary | ICD-10-CM | POA: Diagnosis not present

## 2017-04-30 DIAGNOSIS — R531 Weakness: Secondary | ICD-10-CM | POA: Diagnosis not present

## 2017-04-30 DIAGNOSIS — M79601 Pain in right arm: Secondary | ICD-10-CM | POA: Diagnosis not present

## 2017-04-30 DIAGNOSIS — Z79899 Other long term (current) drug therapy: Secondary | ICD-10-CM

## 2017-04-30 DIAGNOSIS — F0390 Unspecified dementia without behavioral disturbance: Secondary | ICD-10-CM | POA: Diagnosis present

## 2017-04-30 DIAGNOSIS — F32A Depression, unspecified: Secondary | ICD-10-CM

## 2017-04-30 DIAGNOSIS — S199XXA Unspecified injury of neck, initial encounter: Secondary | ICD-10-CM | POA: Diagnosis not present

## 2017-04-30 DIAGNOSIS — R1030 Lower abdominal pain, unspecified: Secondary | ICD-10-CM

## 2017-04-30 LAB — URINALYSIS, ROUTINE W REFLEX MICROSCOPIC
Bilirubin Urine: NEGATIVE
Glucose, UA: NEGATIVE mg/dL
Hgb urine dipstick: NEGATIVE
Ketones, ur: NEGATIVE mg/dL
Leukocytes, UA: NEGATIVE
Nitrite: NEGATIVE
Protein, ur: NEGATIVE mg/dL
Specific Gravity, Urine: 1.006 (ref 1.005–1.030)
pH: 7 (ref 5.0–8.0)

## 2017-04-30 LAB — CBC WITH DIFFERENTIAL/PLATELET
Basophils Absolute: 0 10*3/uL (ref 0.0–0.1)
Basophils Relative: 0 %
Eosinophils Absolute: 0 10*3/uL (ref 0.0–0.7)
Eosinophils Relative: 0 %
HCT: 35.7 % — ABNORMAL LOW (ref 36.0–46.0)
Hemoglobin: 11.4 g/dL — ABNORMAL LOW (ref 12.0–15.0)
Lymphocytes Relative: 10 %
Lymphs Abs: 0.7 10*3/uL (ref 0.7–4.0)
MCH: 27.2 pg (ref 26.0–34.0)
MCHC: 31.9 g/dL (ref 30.0–36.0)
MCV: 85.2 fL (ref 78.0–100.0)
Monocytes Absolute: 0.4 10*3/uL (ref 0.1–1.0)
Monocytes Relative: 5 %
Neutro Abs: 6.3 10*3/uL (ref 1.7–7.7)
Neutrophils Relative %: 85 %
Platelets: 260 10*3/uL (ref 150–400)
RBC: 4.19 MIL/uL (ref 3.87–5.11)
RDW: 15.2 % (ref 11.5–15.5)
WBC: 7.4 10*3/uL (ref 4.0–10.5)

## 2017-04-30 LAB — COMPREHENSIVE METABOLIC PANEL
ALT: 18 U/L (ref 14–54)
AST: 24 U/L (ref 15–41)
Albumin: 3.3 g/dL — ABNORMAL LOW (ref 3.5–5.0)
Alkaline Phosphatase: 58 U/L (ref 38–126)
Anion gap: 8 (ref 5–15)
BUN: 11 mg/dL (ref 6–20)
CO2: 26 mmol/L (ref 22–32)
Calcium: 8.9 mg/dL (ref 8.9–10.3)
Chloride: 106 mmol/L (ref 101–111)
Creatinine, Ser: 0.92 mg/dL (ref 0.44–1.00)
GFR calc Af Amer: >60 mL/min (ref >60)
GFR calc non Af Amer: 59 mL/min — ABNORMAL LOW (ref >60)
Glucose, Bld: 137 mg/dL — ABNORMAL HIGH (ref 65–99)
Potassium: 4 mmol/L (ref 3.5–5.1)
Sodium: 140 mmol/L (ref 135–145)
Total Bilirubin: 0.4 mg/dL (ref 0.3–1.2)
Total Protein: 5.9 g/dL — ABNORMAL LOW (ref 6.5–8.1)

## 2017-04-30 LAB — ECHOCARDIOGRAM COMPLETE
Height: 63 in
Weight: 2080 oz

## 2017-04-30 MED ORDER — BUPROPION HCL ER (XL) 150 MG PO TB24
150.0000 mg | ORAL_TABLET | Freq: Every day | ORAL | Status: DC
  Administered 2017-04-30 – 2017-05-04 (×5): 150 mg via ORAL
  Filled 2017-04-30 (×5): qty 1

## 2017-04-30 MED ORDER — ENOXAPARIN SODIUM 40 MG/0.4ML ~~LOC~~ SOLN
40.0000 mg | SUBCUTANEOUS | Status: DC
  Administered 2017-04-30 – 2017-05-03 (×4): 40 mg via SUBCUTANEOUS
  Filled 2017-04-30 (×4): qty 0.4

## 2017-04-30 MED ORDER — BISACODYL 10 MG RE SUPP: 10.0000 mg | Freq: Every day | RECTAL | Status: DC | PRN

## 2017-04-30 MED ORDER — SENNOSIDES-DOCUSATE SODIUM 8.6-50 MG PO TABS: 1.0000 | ORAL_TABLET | Freq: Every evening | ORAL | Status: DC | PRN

## 2017-04-30 MED ORDER — SODIUM CHLORIDE 0.9 % IV BOLUS (SEPSIS)
1000.0000 mL | Freq: Once | INTRAVENOUS | Status: AC
  Administered 2017-04-30: 1000 mL via INTRAVENOUS

## 2017-04-30 MED ORDER — SODIUM CHLORIDE 0.9 % IV SOLN
INTRAVENOUS | Status: DC
  Administered 2017-04-30 – 2017-05-02 (×5): via INTRAVENOUS

## 2017-04-30 MED ORDER — ONDANSETRON HCL 4 MG PO TABS: 4.0000 mg | ORAL_TABLET | Freq: Four times a day (QID) | ORAL | Status: DC | PRN

## 2017-04-30 MED ORDER — ONDANSETRON HCL 4 MG/2ML IJ SOLN: 4.0000 mg | Freq: Four times a day (QID) | INTRAMUSCULAR | Status: DC | PRN

## 2017-04-30 MED ORDER — TERAZOSIN HCL 1 MG PO CAPS
1.0000 mg | ORAL_CAPSULE | Freq: Every day | ORAL | Status: DC
  Administered 2017-05-01: 1 mg via ORAL
  Filled 2017-04-30: qty 1

## 2017-04-30 MED ORDER — ACETAMINOPHEN 650 MG RE SUPP: 650.0000 mg | Freq: Four times a day (QID) | RECTAL | Status: DC | PRN

## 2017-04-30 MED ORDER — ACETAMINOPHEN 325 MG PO TABS: 650.0000 mg | ORAL_TABLET | Freq: Four times a day (QID) | ORAL | Status: DC | PRN

## 2017-04-30 MED ORDER — NORTRIPTYLINE HCL 25 MG PO CAPS
150.0000 mg | ORAL_CAPSULE | Freq: Every day | ORAL | Status: DC
  Administered 2017-04-30 – 2017-05-03 (×4): 150 mg via ORAL
  Filled 2017-04-30 (×4): qty 6

## 2017-04-30 NOTE — Evaluation (Signed)
Physical Therapy Evaluation Patient Details Name: Elizabeth [name] MRN: [id number] DOB: 07-Jun-1941 Today's Date: 04/30/2017   History of Present Illness  Pt is a 76 y.o. female with medical history significant for HTN, HLD, , depression, IBS, insomnia, anxiety, headaches, mild dementia, and congenital absence of the left arm.  Pt presented to the ED with dizziness, generalized weakness and s/p multiple falls.  Clinical Impression  Pt admitted with above diagnosis. Pt currently with functional limitations due to the deficits listed below (see PT Problem List). On eval, pt required min assist bed mobility and mod assist transfers. Unable to progress ambulation due to orthostatic hypotension. Pt presented with increased confusion during stance as well as c/o dizziness. She was able to tolerate stand [facility] Pt with congenital absence of L hand. She will need L platform attachment if RW indicated.  PT to further assess gait when medically stable. Pt will benefit from skilled PT to increase their independence and safety with mobility to allow discharge to the venue listed below.       Follow Up Recommendations SNF;Supervision/Assistance - 24 hour    Equipment Recommendations  Other (comment) (TBD)    Recommendations for Other Services       Precautions / Restrictions Precautions Precautions: Fall;Other (comment) Precaution Comments: watch BP      Mobility  Bed Mobility Overal bed mobility: Needs Assistance Bed Mobility: Supine to Sit;Sit to Supine     Supine to sit: Min assist Sit to supine: Min assist   General bed mobility comments: verbal cues for sequencing  Transfers Overall transfer level: Needs assistance Equipment used: 1 person hand held assist Transfers: Sit to/from [employer]Stand;Stand Pivot Transfers Sit to Stand: Mod assist Stand pivot transfers: Mod assist       General transfer comment: increased time to stabilize initial standing balance. Verbal cues  for sequencing and safety. Orthostatic BPs taken as follows: supine 157/71, sitting 152/66, standing at 0 minutes 117/35, standing after 3 minutes 74/54  Ambulation/Gait             General Gait Details: unable to progress with ambulation due to hypotension  Stairs            Wheelchair Mobility    Modified Rankin (Stroke Patients Only)       Balance Overall balance assessment: Needs assistance Sitting-balance support: No upper extremity supported;Feet supported Sitting balance-Leahy Scale: Fair   Postural control: Posterior lean Standing balance support: Single extremity supported;During functional activity Standing balance-Leahy Scale: Poor Standing balance comment: retropulsive, unsteady, pt reports dizziness                             Pertinent Vitals/Pain Pain Assessment: Faces Faces Pain Scale: Hurts little more Pain Location: RUE while taking BP Pain Descriptors / Indicators: Grimacing;Guarding;Moaning Pain Intervention(s): Monitored during session    Home Living Family/patient expects to be discharged to:: Private residence Living Arrangements: Children (son) Available Help at Discharge: Family;Available 24 hours/day (only able to provide supervision assist) Type of Home: House Home Access: Stairs to enter Entrance Stairs-Rails: Doctor, general practiceight;Left Entrance Stairs-Number of Steps: 3 Home Layout: One level Home Equipment: None      Prior Function Level of Independence: Independent         Comments: Active. Still drove until Jan. 2018.     Hand Dominance   Dominant Hand: Right    Extremity/Trunk Assessment   Upper Extremity Assessment Upper Extremity Assessment: Generalized weakness  Lower Extremity Assessment Lower Extremity Assessment: Generalized weakness    Cervical / Trunk Assessment Cervical / Trunk Assessment: Normal  Communication   Communication: Expressive difficulties (repetitive and tangential speech)  Cognition  Arousal/Alertness: Awake/alert Behavior During Therapy: Anxious;Impulsive Overall Cognitive Status: Impaired/Different from baseline Area of Impairment: Orientation;Attention;Memory;Safety/judgement;Following commands;Awareness;Problem solving                 Orientation Level: Disoriented to;Situation Current Attention Level: Selective Memory: Decreased short-term memory;Decreased recall of precautions Following Commands: Follows one step commands consistently;Follows one step commands with increased time Safety/Judgement: Decreased awareness of safety Awareness: Emergent Problem Solving: Difficulty sequencing;Requires verbal cues        General Comments      Exercises     Assessment/Plan    PT Assessment Patient needs continued PT services  PT Problem List Decreased strength;Decreased mobility;Decreased cognition;Pain;Decreased balance;Decreased activity tolerance;Decreased safety awareness;Decreased knowledge of precautions       PT Treatment Interventions DME instruction;Gait training;Functional mobility training;Stair training;Balance training;Therapeutic exercise;Therapeutic activities;Patient/family education    PT Goals (Current goals can be found in the Care Plan section)  Acute Rehab PT Goals Patient Stated Goal: not stated PT Goal Formulation: With patient/family Time For Goal Achievement: 05/14/17 Potential to Achieve Goals: Good    Frequency Min 3X/week   Barriers to discharge Decreased caregiver support      Co-evaluation               AM-PAC PT "6 Clicks" Daily Activity  Outcome Measure Difficulty turning over in bed (including adjusting bedclothes, sheets and blankets)?: A Little Difficulty moving from lying on back to sitting on the side of the bed? : Total Difficulty sitting down on and standing up from a chair with arms (e.g., wheelchair, bedside commode, etc,.)?: Total Help needed moving to and from a bed to chair (including a  wheelchair)?: A Little Help needed walking in hospital room?: A Lot Help needed climbing 3-5 steps with a railing? : Total 6 Click Score: 11    End of Session Equipment Utilized During Treatment: Gait belt Activity Tolerance: Treatment limited secondary to medical complications (Comment) (orthostatic hypotension) Patient left: in bed;with bed alarm set;with call bell/phone within reach;with family/visitor present Nurse Communication: Mobility status;Other (comment) (BP) PT Visit Diagnosis: Unsteadiness on feet (R26.81);Dizziness and giddiness (R42);Difficulty in walking, not elsewhere classified (R26.2)    Time: 1610-9604 PT Time Calculation (min) (ACUTE ONLY): 28 min   Charges:   PT Evaluation $PT Eval Moderate Complexity: 1 Procedure PT Treatments $Therapeutic Activity: 8-22 mins   PT G Codes:   PT G-Codes **NOT FOR INPATIENT CLASS** Functional Assessment Tool Used: AM-PAC 6 Clicks Basic Mobility Functional Limitation: Mobility: Walking and moving around Mobility: Walking and Moving Around Current Status (V4098): At least 60 percent but less than 80 percent impaired, limited or restricted Mobility: Walking and Moving Around Goal Status [phone]): At least 20 percent but less than 40 percent impaired, limited or restricted    Aida Raider, PT  Office # [phone] Pager [phone]   Ilda Foil 04/30/2017, 2:35 PM

## 2017-04-30 NOTE — Progress Notes (Signed)
Elizabeth [name] is a 76 y.o. female patient admitted from ED awake, alert - oriented  X 3-4 - no acute distress noted.  VSS - Blood pressure (!) 165/75, pulse 99, temperature 98 F (36.7 C), temperature source Oral, resp. rate 20, height 5\' 3"  (1.6 m), weight 59 kg (130 lb), SpO2 100 %.   Completed a set of Orthostatic Vital signs lying 157/71, P 96, sitting 152/66 P. 97, standing 117/35 P. 101, standing 3 minutes 74/54 P. 109,  IV in place, occlusive dsg  intact without redness NS infusing at 100cc Hr.  Orientation to room, and floor completed with information packet given to patient/family.  Patient declined safety video at this time.  Admission INP armband ID verified with patient/family, and in place.   SR up x 2, fall assessment complete, with patient and family able to verbalize understanding of risk associated with falls, and verbalized understanding to call nsg before up out of bed.  Call light within reach, patient able to voice, and demonstrate understanding.  Skin, clean-dry- intact without evidence of bruising, or skin tears.   No evidence of skin break down noted on exam.     Will cont to eval and treat per MD orders.  Evern BioLoren D [name], RN 04/30/2017 2:26 PM

## 2017-04-30 NOTE — ED Triage Notes (Signed)
[facility] with c/o dizziness, aphasia and AMS x 2 wks per son, whom she lives with. Pt has also had multiple falls, 3 this morning. Multiple ED/doc visits for falls x 2-3 wks. Daughter states they have been seeing neurologist for unsteady gait, had normal MRI in April. Daughter states speech changes were noticed 1 wk ago. Presents to ED with aphasia, c/o dizziness when standing. CBG 122, HTN hx - 176/92

## 2017-04-30 NOTE — H&P (Signed)
History and Physical    Elizabeth Radmily R Mahlum VOZ:[id number] DOB: 10-Nov-1941 DOA: 04/30/2017   PCP: Joycelyn RuaMeyers, Stephen, MD   Patient coming from:  Home    Chief Complaint: generalized weakness, dehydration   HPI: Elizabeth [name] is a 76 y.o. female with medical history significant for HTN, HLD, , depression, IBS, insomnia, anxiety, headaches, mild dementia, congenital absence of the left arm, recently seen at the ED on 04/18/17 for intermittent dizziness and generalized weakness with negative workup, discharged home with neurology follow up on June 4 and PCP close monitoring. During that visit, urine cultures had been drawn were negative for growth. She was in her usual state of health, until 3 days ago, when she began to experience several falls. Patient is a difficult his story on, and most of the history is obtained by her daughter. She was transferred by EMS after falling 2nd time overnight. No known injuries. On presentation, she showed generalized weakness, Jae [place] travel as before, without numbness. She was very debilitated. According to her daughter, she had some confusion over the last 3 days but still able to continue her ADLs. She continues to take her usual medications without any changes. The patient has shown poor appetite, especially with liquids. She has not eaten on presentation. Denies any recent long distance trips, or infections treated with antibiotics. She denies any vision changes. No seizure is reported. She denies any chest pain or palpitations. No shortness of breath. No abdominal pain nausea or vomiting. She denies any diarrhea. She denies any dysuria or gross hematuria. There is a strong urine odor in the room.   ED Course:  BP (!) 165/75   Pulse (!) 101   Temp 97.6 F (36.4 C) (Oral)   Resp (!) 24   Ht 5\' 3"  (1.6 m)   Wt 59 kg (130 lb)   SpO2 100%   BMI 23.03 kg/m    white count 7.4 hemoglobin 11.4 platelets 260 sodium 140 potassium 4.0 bicarb 26 creatinine 0.92 glucose  137 received 1 L of IV fluids with normal saline at the ER. Present CT of the head and MRI of the brain on prior EGD presentation were negative for acute intracranial abnormalities. Review of Systems:  As per HPI otherwise all other systems reviewed and are negative  Past Medical History:  Diagnosis Date  . Allergic rhinitis   . Anxiety   . Congenital defect    left arm  . Depression   . GERD (gastroesophageal reflux disease)   . Headache   . Hyperlipemia   . Hypertension   . IBS (irritable bowel syndrome)   . Insomnia   . Memory loss   . Postmenopausal   . Vitamin D deficiency     Past Surgical History:  Procedure Laterality Date  . ABDOMINAL HYSTERECTOMY    . CATARACT EXTRACTION Bilateral   . NECK SURGERY      Social History Social History   Social History  . Marital status: Widowed    Spouse name: N/A  . Number of children: 1  . Years of education: HS   Occupational History  . Retired    Social History Main Topics  . Smoking status: Never Smoker  . Smokeless tobacco: Never Used  . Alcohol use No  . Drug use: No  . Sexual activity: Not on file   Other Topics Concern  . Not on file   Social History Narrative   Lives at home alone with her adopted son.   Right-handed.  No caffeine use.     Allergies  Allergen Reactions  . Norvasc [Amlodipine] Swelling  . Benicar [Olmesartan] Other (See Comments)    Nervousness   . Lisinopril Cough    Family History  Problem Relation Age of Onset  . Breast cancer Mother   . Other Father        unsure of history      Prior to Admission medications   Medication Sig Start Date End Date Taking? Authorizing Provider  buPROPion (WELLBUTRIN XL) 150 MG 24 hr tablet Take 150 mg by mouth daily. 04/07/17  Yes [provider]  ibuprofen (ADVIL,MOTRIN) 200 MG tablet Take 200 mg by mouth every 6 (six) hours as needed for mild pain or moderate pain.    Yes [provider]  nortriptyline (PAMELOR) 75  MG capsule Take 150 mg by mouth at bedtime. 11/07/16  Yes [provider]  terazosin (HYTRIN) 1 MG capsule Take 1 mg by mouth daily. 04/27/17  Yes [provider]    Physical Exam:  Vitals:   04/30/17 1030 04/30/17 1100 04/30/17 1130 04/30/17 1200  BP: (!) 156/81 (!) 167/75 (!) 152/78 (!) 165/75  Pulse: (!) 106 99 (!) 101 (!) 101  Resp: (!) 24 18 (!) 21 (!) 24  Temp:      TempSrc:      SpO2: 98% 99% 99% 100%  Weight:      Height:       Constitutional: NAD, calm, comfortable, but anxious appearing  Eyes: PERRL, lids and conjunctivae normal ENMT: Mucous membranes are moist, without exudate or lesions  Neck: normal, supple, no masses, no thyromegaly Respiratory: clear to auscultation bilaterally, no wheezing, no crackles. Normal respiratory effort  Cardiovascular: Regular rate and rhythm,2/6 systolic murmurs, rubs or gallops. No extremity edema. 2+ pedal pulses. No carotid bruits.  Abdomen: Soft, non tender, No hepatosplenomegaly. Bowel sounds positive.  Musculoskeletal: no clubbing / cyanosis. Moves all extremities, but limited on the L arm and shoulder,with some crepitus sounds noted.  Skin: no jaundice, No lesions.  Neurologic: Sensation intact  Strength equal in all extremities. L hand is congenitally absent Psychiatric:   Alert and oriented x 3.  Level of comprehension is normal, she has chronic difficulties with expression, Anxious mood.     Labs on Admission: I have personally reviewed following labs and imaging studies  CBC:  Recent Labs Lab 04/30/17 0940  WBC 7.4  NEUTROABS 6.3  HGB 11.4*  HCT 35.7*  MCV 85.2  PLT 260    Basic Metabolic Panel:  Recent Labs Lab 04/30/17 0940  NA 140  K 4.0  CL 106  CO2 26  GLUCOSE 137*  BUN 11  CREATININE 0.92  CALCIUM 8.9    GFR: Estimated Creatinine Clearance: 42.4 mL/min (by C-G formula based on SCr of 0.92 mg/dL).  Liver Function Tests:  Recent Labs Lab 04/30/17 0940  AST 24  ALT 18   ALKPHOS 58  BILITOT 0.4  PROT 5.9*  ALBUMIN 3.3*   No results for input(s): LIPASE, AMYLASE in the last 168 hours. No results for input(s): AMMONIA in the last 168 hours.  Coagulation Profile: No results for input(s): INR, PROTIME in the last 168 hours.  Cardiac Enzymes: No results for input(s): CKTOTAL, CKMB, CKMBINDEX, TROPONINI in the last 168 hours.  BNP (last 3 results) No results for input(s): PROBNP in the last 8760 hours.  HbA1C: No results for input(s): HGBA1C in the last 72 hours.  CBG: No results for input(s): GLUCAP in the  last 168 hours.  Lipid Profile: No results for input(s): CHOL, HDL, LDLCALC, TRIG, CHOLHDL, LDLDIRECT in the last 72 hours.  Thyroid Function Tests: No results for input(s): TSH, T4TOTAL, FREET4, T3FREE, THYROIDAB in the last 72 hours.  Anemia Panel: No results for input(s): VITAMINB12, FOLATE, FERRITIN, TIBC, IRON, RETICCTPCT in the last 72 hours.  Urine analysis:    Component Value Date/Time   COLORURINE STRAW (A) 04/30/2017 0837   APPEARANCEUR CLEAR 04/30/2017 0837   LABSPEC 1.006 04/30/2017 0837   PHURINE 7.0 04/30/2017 0837   GLUCOSEU NEGATIVE 04/30/2017 0837   HGBUR NEGATIVE 04/30/2017 0837   BILIRUBINUR NEGATIVE 04/30/2017 0837   KETONESUR NEGATIVE 04/30/2017 0837   PROTEINUR NEGATIVE 04/30/2017 0837   UROBILINOGEN 1.0 06/18/2010 1234   NITRITE NEGATIVE 04/30/2017 0837   LEUKOCYTESUR NEGATIVE 04/30/2017 0837    Sepsis Labs: @LABRCNTIP (procalcitonin:4,lacticidven:4) )No results found for this or any previous visit (from the past 240 hour(s)).   Radiological Exams on Admission: No results found.  EKG: Independently reviewed.  Assessment/Plan Active Problems:   Generalized weakness   Hypertension   Depression   Right arm pain   Anxiety  Orthostatic Hypotension: Unclear cause, in the setting of dehydration. UA is pending. Afebrile. Received 1 L IVF . EKG S tach without ACS. CMET unremarkable. BP improved with IVF  current at 165/75   Echocardiogram ordered Continue IVF  EKG in am    Right arm pain with movement, likely due to overcompensation in absence of left hand recent fall Check XR elbow, shoulder, neck r/o fracture OT/PT   Hypertension BP 165/75   Pulse 101    Continue home anti-hypertensive medications in am in view of orthostatic hypotension  Add Hydralazine Q6 hours as needed for BP  200/100   Anxiety and Depression Continue meds    DVT prophylaxis: Lovenox  Code Status:   Full      Family Communication:  Discussed with patient Disposition Plan: Expect patient to be discharged to home after condition improves Consults called:     Admission status: tele Obs   Rigdon Macomber E, PA-C [facility]   04/30/2017, 12:33 PM

## 2017-04-30 NOTE — ED Provider Notes (Signed)
[facility]-EMERGENCY DEPT Provider Note   CSN: [id number] Arrival date & time: 04/30/17  1308     History   Chief Complaint Chief Complaint  Patient presents with  . Altered Mental Status  . Aphasia  . Dizziness    HPI Elizabeth [name] is a 76 y.o. female.  The patient is being evaluated today for multiple falls.  She was transferred by EMS, after falling the second time overnight.  There are no known injuries.  She was placed in a cervical collar by EMS.  Patient has been followed by her PCP, and neurology for several months for falls, weakness, gait trouble, numbness, confusion, and debilitated state.  She is continuing to take her usual medications.  She lives with the family member, her son.  She has a chronic disability, left arm, congenital deformity, which prevents her from using a walker.  She has been recommended to use a cane but chosen not to do it.  Last night she fell twice, when her "leg slipped out from under her".  She apparently has trouble initiating ambulation, going from supine or sitting to standing.  Patient is a poor historian, and defers to family members to answer questions.  Patient's son reports that she has a poor appetite does not typically 3 meals a day but yesterday did eat a "hamburger".  She has not yet eaten this morning.  She has recently had comprehensive evaluations as an outpatient.  No recent hospitalizations.  There are no other known modifying factors.  HPI  Past Medical History:  Diagnosis Date  . Allergic rhinitis   . Anxiety   . Congenital defect    left arm  . Depression   . GERD (gastroesophageal reflux disease)   . Headache   . Hyperlipemia   . Hypertension   . IBS (irritable bowel syndrome)   . Insomnia   . Memory loss   . Postmenopausal   . Vitamin D deficiency     Patient Active Problem List   Diagnosis Date Noted  . Passed out 02/06/2017  . Memory loss 02/06/2017    Past Surgical History:  Procedure Laterality Date  .  ABDOMINAL HYSTERECTOMY    . CATARACT EXTRACTION Bilateral   . NECK SURGERY      OB History    No data available       Home Medications    Prior to Admission medications   Medication Sig Start Date End Date Taking? Authorizing Provider  bisoprolol-hydrochlorothiazide (ZIAC) 10-6.25 MG tablet Take 1 tablet by mouth 2 (two) times daily.  11/08/16   [provider]  buPROPion (WELLBUTRIN XL) 150 MG 24 hr tablet Take 150 mg by mouth daily. 04/07/17   [provider]  ibuprofen (ADVIL,MOTRIN) 200 MG tablet Take 200 mg by mouth every 6 (six) hours as needed for mild pain or moderate pain.     [provider]  lovastatin (MEVACOR) 40 MG tablet Take 40 mg by mouth daily. 04/07/17   [provider]  nortriptyline (PAMELOR) 75 MG capsule Take 150 mg by mouth at bedtime. 11/07/16   [provider]  terazosin (HYTRIN) 1 MG capsule Take 1 mg by mouth daily. 04/27/17   [provider]    Family History Family History  Problem Relation Age of Onset  . Breast cancer Mother   . Other Father        unsure of history    Social History Social History  Substance Use Topics  . Smoking status: Never  Smoker  . Smokeless tobacco: Never Used  . Alcohol use No     Allergies   Norvasc [amlodipine]; Benicar [olmesartan]; and Lisinopril   Review of Systems Review of Systems  All other systems reviewed and are negative.    Physical Exam Updated Vital Signs BP (!) 156/81   Pulse (!) 106   Temp 97.6 F (36.4 C) (Oral)   Resp (!) 24   Ht 5\' 3"  (1.6 m)   Wt 130 lb (59 kg)   SpO2 98%   BMI 23.03 kg/m   Physical Exam  Constitutional: She appears well-developed. No distress.  Elderly, frail  HENT:  Head: Normocephalic and atraumatic.  No injuries to scalp or face.  Oral mucous membranes are dry.  Eyes: Conjunctivae and EOM are normal. Pupils are equal, round, and reactive to light.  Neck: Normal range of motion and phonation normal.  Neck supple.  Cardiovascular: Normal rate and regular rhythm.   Pulmonary/Chest: Effort normal and breath sounds normal. She exhibits no tenderness.  Abdominal: Soft. She exhibits no distension. There is no tenderness. There is no guarding.  Musculoskeletal: Normal range of motion.  Normal strength arms and legs bilaterally.  Deformity left arm, absent left hand, and nubs of fingers, on wrist region.  Neurological: She is alert. She exhibits normal muscle tone.  She is alert responsive.  No gross coordination abnormality. No dysarthria and aphasia or nystagmus.  Skin: Skin is warm and dry.  Psychiatric: She has a normal mood and affect. Her behavior is normal. Judgment and thought content normal.  Nursing note and vitals reviewed.    ED Treatments / Results  Labs (all labs ordered are listed, but only abnormal results are displayed) Labs Reviewed  URINALYSIS, ROUTINE W REFLEX MICROSCOPIC - Abnormal; Notable for the following:       Result Value   Color, Urine STRAW (*)    All other components within normal limits  CBC WITH DIFFERENTIAL/PLATELET - Abnormal; Notable for the following:    Hemoglobin 11.4 (*)    HCT 35.7 (*)    All other components within normal limits  COMPREHENSIVE METABOLIC PANEL - Abnormal; Notable for the following:    Glucose, Bld 137 (*)    Total Protein 5.9 (*)    Albumin 3.3 (*)    GFR calc non Af Amer 59 (*)    All other components within normal limits  CBC WITH DIFFERENTIAL/PLATELET    EKG  EKG Interpretation  Date/Time:  Sunday Apr 30 2017 07:33:38 EDT Ventricular Rate:  101 PR Interval:    QRS Duration: 109 QT Interval:  366 QTC Calculation: 475 R Axis:   -17 Text Interpretation:  Sinus tachycardia Probable left ventricular hypertrophy since last tracing no significant change Confirmed by Mancel BaleWentz, Jillian Pianka [phone]([id number]) on 04/30/2017 7:40:35 AM       Radiology No results found.  Procedures Procedures (including critical care time)  Medications  Ordered in ED Medications  sodium chloride 0.9 % bolus 1,000 mL (1,000 mLs Intravenous New Bag/Given 04/30/17 0845)     Initial Impression / Assessment and Plan / ED Course  I have reviewed the triage vital signs and the nursing notes.  Pertinent labs & imaging results that were available during my care of the patient were reviewed by me and considered in my medical decision making (see chart for details).      Patient Vitals for the past 24 hrs:  BP Temp Temp src Pulse Resp SpO2 Height Weight  04/30/17 1030 (!) 156/81 - - Marland Kitchen(!)  106 (!) 24 98 % - -  04/30/17 1000 (!) 152/80 - - 100 19 92 % - -  04/30/17 0930 (!) 156/62 - - (!) 101 (!) 23 98 % - -  04/30/17 0900 (!) 167/73 - - 100 18 99 % - -  04/30/17 0830 (!) 149/86 - - 100 (!) 24 98 % - -  04/30/17 0815 - - - 99 16 100 % - -  04/30/17 0800 (!) 166/74 - - (!) 101 18 99 % - -  04/30/17 0734 (!) 163/72 97.6 F (36.4 C) Oral (!) 103 20 99 % - -  04/30/17 4098 - - - - - - 5\' 3"  (1.6 m) 130 lb (59 kg)  04/30/17 0723 - - - - - 94 % - -    11:22 AM Reevaluation with update and discussion. After initial assessment and treatment, an updated evaluation reveals after 1 L saline bolus, ambulation trial, patient required 1 person assist to prevent her from falling.  On reassessment clinical exam unchanged she was able to eat 2 bananas, as her breakfast.  Discussed findings with the patient's daughter, who is concerned about the patient's confusion, weakness and falling.  The patient lives with her son, who according to the daughter is "slow".  He is unable to give the patient the assistance that she needs at home.  The daughter does not live locally.  We will ask hospitalist to admit for further treatment, assessment, and stabilization.  Patient is agreeable. Malik Ruffino L    11:24 AM-Consult complete with hospitalist. Patient case explained and discussed. She agrees to admit patient for further evaluation and treatment. Call ended at  11:35  Final Clinical Impressions(s) / ED Diagnoses   Final diagnoses:  Fall, initial encounter  Dehydration  Weakness  Confusion   Patient with prolonged period of weakness, periods of altered mental status, and falling over the last several months.  She is an unstable home situation, without adequate support to accommodate her changing medical needs.  No evidence today for significant bacterial infection or metabolic instability.  Suspect mild dehydration manifested by positive orthostatic blood pressure response to standing.  On ambulation testing the patient is unstable and her risk for fall.  She has had 2 falls in the last 24 hours without serious injury.  Her ability to use assistive devices is limited by her absent left hand.  She has not had physical therapy evaluation during this period of time of decompensation.  Patient is a risk for discharge for worsening condition, including injuries, and a stabilization of her medical condition.  Doubt acute CVA, acute spine injury or spine disorder, or impending vascular collapse.  Hospitalist consulted to admit patient for further evaluation, and treatment.  Nursing Notes Reviewed/ Care Coordinated Applicable Imaging Reviewed Interpretation of Laboratory Data incorporated into ED treatment  Plan: Admit    New Prescriptions New Prescriptions   No medications on file     Mancel Bale, MD 04/30/17 1139

## 2017-04-30 NOTE — ED Notes (Signed)
Pt ambulated with 2 staff members, had unsteady gait on way to bathroom. Pt kept leaning backwards and to the left. MD notified.

## 2017-04-30 NOTE — ED Triage Notes (Signed)
PT on bed pan

## 2017-04-30 NOTE — Progress Notes (Signed)
  Echocardiogram 2D Echocardiogram has been performed.  Jakavion Bilodeau L Androw 04/30/2017, 3:44 PM

## 2017-05-01 DIAGNOSIS — M6281 Muscle weakness (generalized): Secondary | ICD-10-CM | POA: Diagnosis not present

## 2017-05-01 DIAGNOSIS — Z6821 Body mass index (BMI) 21.0-21.9, adult: Secondary | ICD-10-CM | POA: Diagnosis not present

## 2017-05-01 DIAGNOSIS — R410 Disorientation, unspecified: Secondary | ICD-10-CM | POA: Diagnosis not present

## 2017-05-01 DIAGNOSIS — R1030 Lower abdominal pain, unspecified: Secondary | ICD-10-CM | POA: Diagnosis not present

## 2017-05-01 DIAGNOSIS — I959 Hypotension, unspecified: Secondary | ICD-10-CM | POA: Diagnosis not present

## 2017-05-01 DIAGNOSIS — W19XXXA Unspecified fall, initial encounter: Secondary | ICD-10-CM | POA: Diagnosis not present

## 2017-05-01 DIAGNOSIS — K5900 Constipation, unspecified: Secondary | ICD-10-CM | POA: Diagnosis not present

## 2017-05-01 DIAGNOSIS — E43 Unspecified severe protein-calorie malnutrition: Secondary | ICD-10-CM | POA: Diagnosis not present

## 2017-05-01 DIAGNOSIS — M79601 Pain in right arm: Secondary | ICD-10-CM | POA: Diagnosis not present

## 2017-05-01 DIAGNOSIS — Z9181 History of falling: Secondary | ICD-10-CM | POA: Diagnosis not present

## 2017-05-01 DIAGNOSIS — R278 Other lack of coordination: Secondary | ICD-10-CM | POA: Diagnosis not present

## 2017-05-01 DIAGNOSIS — I951 Orthostatic hypotension: Secondary | ICD-10-CM | POA: Diagnosis not present

## 2017-05-01 DIAGNOSIS — R2689 Other abnormalities of gait and mobility: Secondary | ICD-10-CM | POA: Diagnosis not present

## 2017-05-01 DIAGNOSIS — R41841 Cognitive communication deficit: Secondary | ICD-10-CM | POA: Diagnosis not present

## 2017-05-01 DIAGNOSIS — F329 Major depressive disorder, single episode, unspecified: Secondary | ICD-10-CM | POA: Diagnosis not present

## 2017-05-01 DIAGNOSIS — F419 Anxiety disorder, unspecified: Secondary | ICD-10-CM | POA: Diagnosis not present

## 2017-05-01 DIAGNOSIS — F0390 Unspecified dementia without behavioral disturbance: Secondary | ICD-10-CM | POA: Diagnosis not present

## 2017-05-01 DIAGNOSIS — E860 Dehydration: Secondary | ICD-10-CM

## 2017-05-01 DIAGNOSIS — Z79899 Other long term (current) drug therapy: Secondary | ICD-10-CM | POA: Diagnosis not present

## 2017-05-01 DIAGNOSIS — K589 Irritable bowel syndrome without diarrhea: Secondary | ICD-10-CM | POA: Diagnosis not present

## 2017-05-01 DIAGNOSIS — I10 Essential (primary) hypertension: Secondary | ICD-10-CM | POA: Diagnosis not present

## 2017-05-01 DIAGNOSIS — W19XXXS Unspecified fall, sequela: Secondary | ICD-10-CM | POA: Diagnosis not present

## 2017-05-01 DIAGNOSIS — R531 Weakness: Secondary | ICD-10-CM | POA: Diagnosis not present

## 2017-05-01 LAB — COMPREHENSIVE METABOLIC PANEL
ALT: 19 U/L (ref 14–54)
AST: 23 U/L (ref 15–41)
Albumin: 3.5 g/dL (ref 3.5–5.0)
Alkaline Phosphatase: 63 U/L (ref 38–126)
Anion gap: 9 (ref 5–15)
BUN: 6 mg/dL (ref 6–20)
CO2: 23 mmol/L (ref 22–32)
Calcium: 8.9 mg/dL (ref 8.9–10.3)
Chloride: 108 mmol/L (ref 101–111)
Creatinine, Ser: 0.7 mg/dL (ref 0.44–1.00)
GFR calc Af Amer: >60 mL/min (ref >60)
GFR calc non Af Amer: >60 mL/min (ref >60)
Glucose, Bld: 95 mg/dL (ref 65–99)
Potassium: 3.6 mmol/L (ref 3.5–5.1)
Sodium: 140 mmol/L (ref 135–145)
Total Bilirubin: 0.5 mg/dL (ref 0.3–1.2)
Total Protein: 5.9 g/dL — ABNORMAL LOW (ref 6.5–8.1)

## 2017-05-01 LAB — CBC
HCT: 38.5 % (ref 36.0–46.0)
Hemoglobin: 12.1 g/dL (ref 12.0–15.0)
MCH: 27 pg (ref 26.0–34.0)
MCHC: 31.4 g/dL (ref 30.0–36.0)
MCV: 85.9 fL (ref 78.0–100.0)
Platelets: 249 10*3/uL (ref 150–400)
RBC: 4.48 MIL/uL (ref 3.87–5.11)
RDW: 15.4 % (ref 11.5–15.5)
WBC: 7 10*3/uL (ref 4.0–10.5)

## 2017-05-01 LAB — TSH: TSH: 5.875 u[IU]/mL — ABNORMAL HIGH (ref 0.350–4.500)

## 2017-05-01 LAB — CORTISOL: Cortisol, Plasma: 23.1 ug/dL

## 2017-05-01 NOTE — Progress Notes (Signed)
PROGRESS NOTE    Elizabeth [name]  ZOX:[id number] DOB: 13-Jun-1941 DOA: 04/30/2017 PCP: Joycelyn Rua, MD   Outpatient Specialists:     Brief Narrative:  Elizabeth [name] is a 76 y.o. female who presents with generalized weakness and dehydration physical examination is relatively unremarkable except for pain with range of motion of the right arm. Patient reports a six-month history of same. She was found to be orthostatic and despite treatment in the ER was requiring continued therapy. Place in observation for management of orthostatic hypotension and will have x-rays of the right elbow, shoulder and neck done to evaluate pain with range of motion.   Assessment & Plan:   Active Problems:   Generalized weakness   Hypertension   Depression   Right arm pain   Anxiety   Orthostatic hypotension -d/c hytrin -recheck in AM -IVF -no sign of infection -echo: Normal LV systolic function; mild diastolic dysfunction; trace   TR; mildly elevated pulmonary pressure.  Anxiety/depression -continue wellbutrin  AMS -no sign of infection -suspect due to hypotension -not eating and not back to baseline  Lives at home with disabled son    DVT prophylaxis:  Lovenox   Code Status: Full Code   Family Communication: Daughter at bedside  Disposition Plan:  SNF   Consultants:    Subjective: Still with positive orthostatics-- mental status worsened when BP dropped  Objective: Vitals:   04/30/17 1200 04/30/17 1407 04/30/17 2133 05/01/17 0556  BP: (!) 165/75  (!) 153/64 (!) 168/77  Pulse: (!) 101 99 (!) 105 79  Resp: (!) 24 20 18 18   Temp:  98 F (36.7 C) 98.9 F (37.2 C) 98 F (36.7 C)  TempSrc:  Oral Oral Oral  SpO2: 100% 100% 94% 96%  Weight:    58.5 kg (129 lb)  Height:        Intake/Output Summary (Last 24 hours) at 05/01/17 1125 Last data filed at 05/01/17 0737  Gross per 24 hour  Intake          1228.33 ml  Output             2000 ml  Net           -771.67 ml   Filed Weights   04/30/17 0733 05/01/17 0556  Weight: 59 kg (130 lb) 58.5 kg (129 lb)    Examination:  General exam: sleeping- family asked not to awaken Respiratory system: Clear to auscultation b/l, no wheezing Cardiovascular system: S1 & S2 heard, RRR. No JVD, murmurs, rubs, gallops or clicks. No pedal edema. Gastrointestinal system: Abdomen is nondistended, soft and nontender. No organomegaly or masses felt. Normal bowel sounds heard. Central nervous system: sleeping Extremities: Symmetric 5 x 5 power. Skin: No rashes, lesions or ulcers    Data Reviewed: I have personally reviewed following labs and imaging studies  CBC:  Recent Labs Lab 04/30/17 0940 05/01/17 0715  WBC 7.4 7.0  NEUTROABS 6.3  --   HGB 11.4* 12.1  HCT 35.7* 38.5  MCV 85.2 85.9  PLT 260 249   Basic Metabolic Panel:  Recent Labs Lab 04/30/17 0940 05/01/17 0715  NA 140 140  K 4.0 3.6  CL 106 108  CO2 26 23  GLUCOSE 137* 95  BUN 11 6  CREATININE 0.92 0.70  CALCIUM 8.9 8.9   GFR: Estimated Creatinine Clearance: 48.7 mL/min (by C-G formula based on SCr of 0.7 mg/dL). Liver Function Tests:  Recent Labs Lab 04/30/17 0940 05/01/17 0715  AST 24 23  ALT 18 19  ALKPHOS 58 63  BILITOT 0.4 0.5  PROT 5.9* 5.9*  ALBUMIN 3.3* 3.5   No results for input(s): LIPASE, AMYLASE in the last 168 hours. No results for input(s): AMMONIA in the last 168 hours. Coagulation Profile: No results for input(s): INR, PROTIME in the last 168 hours. Cardiac Enzymes: No results for input(s): CKTOTAL, CKMB, CKMBINDEX, TROPONINI in the last 168 hours. BNP (last 3 results) No results for input(s): PROBNP in the last 8760 hours. HbA1C: No results for input(s): HGBA1C in the last 72 hours. CBG: No results for input(s): GLUCAP in the last 168 hours. Lipid Profile: No results for input(s): CHOL, HDL, LDLCALC, TRIG, CHOLHDL, LDLDIRECT in the last 72 hours. Thyroid Function Tests: No results for  input(s): TSH, T4TOTAL, FREET4, T3FREE, THYROIDAB in the last 72 hours. Anemia Panel: No results for input(s): VITAMINB12, FOLATE, FERRITIN, TIBC, IRON, RETICCTPCT in the last 72 hours. Urine analysis:    Component Value Date/Time   COLORURINE STRAW (A) 04/30/2017 0837   APPEARANCEUR CLEAR 04/30/2017 0837   LABSPEC 1.006 04/30/2017 0837   PHURINE 7.0 04/30/2017 0837   GLUCOSEU NEGATIVE 04/30/2017 0837   HGBUR NEGATIVE 04/30/2017 0837   BILIRUBINUR NEGATIVE 04/30/2017 0837   KETONESUR NEGATIVE 04/30/2017 0837   PROTEINUR NEGATIVE 04/30/2017 0837   UROBILINOGEN 1.0 06/18/2010 1234   NITRITE NEGATIVE 04/30/2017 0837   LEUKOCYTESUR NEGATIVE 04/30/2017 0837     )No results found for this or any previous visit (from the past 240 hour(s)).    Anti-infectives    None       Radiology Studies: Dg Neck Soft Tissue  Result Date: 04/30/2017 CLINICAL DATA:  Fall. EXAM: NECK SOFT TISSUES - 1+ VIEW COMPARISON:  CT cervical spine 11/21/2016 FINDINGS: Pharynx and hypopharynx are normal. Epiglottis and subglottic airway are normal. Prevertebral soft tissues are normal. Moderate degenerative change of the spine with fusion of vertebral bodies C5-C7 likely congenital. Mild disc space narrowing at the C3-4 level. Uncovertebral joint spurring and facet arthropathy. IMPRESSION: Unremarkable neck soft tissues. Moderate spondylosis of the cervical spine with disc disease at the C3-4 level. Vertebral body fusion C5 through C7. Electronically Signed   By: Elberta Fortisaniel  Boyle M.D.   On: 04/30/2017 16:49   Dg Shoulder Right  Result Date: 04/30/2017 CLINICAL DATA:  Recent fall yesterday with right shoulder pain. EXAM: RIGHT SHOULDER - 2+ VIEW COMPARISON:  Chest x-ray 06/11/2010 FINDINGS: Moderate degenerative change of the glenohumeral joint with likely prominent osteophyte along the medial femoral neck and less likely loose body over the inferior aspect of the glenohumeral joint. Mild degenerative change of the [facility]  joint. No acute fracture or dislocation. Degenerative changes spinal curvature of the thoracic spine convex right. IMPRESSION: No acute findings. Moderate degenerate change of the glenohumeral joint with prominent inferior osteophyte along the medial humeral neck. And mild degenerative change of the [facility] joint Electronically Signed   By: Elberta Fortisaniel  Boyle M.D.   On: 04/30/2017 16:46   Dg Elbow 2 Views Left  Result Date: 04/30/2017 CLINICAL DATA:  Fall. EXAM: LEFT ELBOW - 2 VIEW COMPARISON:  None. FINDINGS: There is no evidence of fracture, dislocation, or joint effusion. There is no evidence of arthropathy or other focal bone abnormality. Soft tissues are unremarkable. IMPRESSION: No acute findings. Electronically Signed   By: Elberta Fortisaniel  Boyle M.D.   On: 04/30/2017 16:51        Scheduled Meds: . buPROPion  150 mg Oral Daily  . enoxaparin (LOVENOX) injection  40 mg Subcutaneous Q24H  .  nortriptyline  150 mg Oral QHS   Continuous Infusions: . sodium chloride 100 mL/hr at 05/01/17 0940     LOS: 0 days    Time spent: 25 min    Zaylyn Bergdoll U Lilyahna Sirmon, DO [facility] Pager [phone]  If 7PM-7AM, please contact night-coverage [email or web address] Password [facility] 05/01/2017, 11:25 AM

## 2017-05-01 NOTE — Progress Notes (Signed)
Physical Therapy Treatment Patient Details Name: Elizabeth [name] MRN: [id number] DOB: [birth year] Today's Date: 05/01/2017    History of Present Illness Pt is a 76 y.o. female with medical history significant for HTN, HLD, , depression, IBS, insomnia, anxiety, headaches, mild dementia, and congenital absence of the left arm.  Pt presented to the ED with dizziness, generalized weakness and s/p multiple falls.    PT Comments    Unable to progress with mobility due to orthostatic hypotension. BP as follows: supine 121/51, sitting 93/53, standing at 0 minutes 78/25, return to supine 140/59. RN and MD notified.   Follow Up Recommendations  SNF;Supervision/Assistance - 24 hour     Equipment Recommendations       Recommendations for Other Services       Precautions / Restrictions Precautions Precautions: Fall;Other (comment) Precaution Comments: watch BP Restrictions Weight Bearing Restrictions: No    Mobility  Bed Mobility         Supine to sit: Min assist Sit to supine: Min assist   General bed mobility comments: verbal cues for sequencing, +rail  Transfers   Equipment used: 1 person hand held assist   Sit to Stand: Mod assist         General transfer comment: retropulsive  Ambulation/Gait             General Gait Details: unable to progress with ambulation due to hypotension   Stairs            Wheelchair Mobility    Modified Rankin (Stroke Patients Only)       Balance                                            Cognition Arousal/Alertness: Lethargic Behavior During Therapy: Anxious;Impulsive Overall Cognitive Status: Impaired/Different from baseline Area of Impairment: Orientation;Attention;Memory;Safety/judgement;Following commands;Awareness;Problem solving                 Orientation Level: Disoriented to;Situation Current Attention Level: Selective Memory: Decreased short-term memory;Decreased recall of  precautions Following Commands: Follows one step commands consistently;Follows one step commands with increased time Safety/Judgement: Decreased awareness of safety Awareness: Emergent Problem Solving: Difficulty sequencing;Requires verbal cues General Comments: confusion, nonsensical speech      Exercises      General Comments        Pertinent Vitals/Pain Pain Assessment: No/denies pain    Home Living                      Prior Function            PT Goals (current goals can now be found in the care plan section) Acute Rehab PT Goals Patient Stated Goal: not stated PT Goal Formulation: With patient/family Time For Goal Achievement: 05/14/17 Potential to Achieve Goals: Good Progress towards PT goals: Not progressing toward goals - comment (orthostatic)    Frequency    Min 3X/week      PT Plan Current plan remains appropriate    Co-evaluation              AM-PAC PT "6 Clicks" Daily Activity  Outcome Measure  Difficulty turning over in bed (including adjusting bedclothes, sheets and blankets)?: A Little Difficulty moving from lying on back to sitting on the side of the bed? : Total Difficulty sitting down on and standing up from a chair with arms (e.g., wheelchair,  bedside commode, etc,.)?: Total Help needed moving to and from a bed to chair (including a wheelchair)?: A Little Help needed walking in hospital room?: A Lot Help needed climbing 3-5 steps with a railing? : Total 6 Click Score: 11    End of Session Equipment Utilized During Treatment: Gait belt Activity Tolerance: Treatment limited secondary to medical complications (Comment) (orthostatic hypotension) Patient left: in bed;with bed alarm set;with call bell/phone within reach;with family/visitor present Nurse Communication: Mobility status;Other (comment) (BP) PT Visit Diagnosis: Unsteadiness on feet (R26.81);Dizziness and giddiness (R42);Difficulty in walking, not elsewhere  classified (R26.2)     Time: [id number] PT Time Calculation (min) (ACUTE ONLY): 31 min  Charges:  $Therapeutic Activity: 23-37 mins                    G Codes:  Functional Assessment Tool Used: AM-PAC 6 Clicks Basic Mobility Functional Limitation: Mobility: Walking and moving around Mobility: Walking and Moving Around Current Status ([id number](G8978): At least 60 percent but less than 80 percent impaired, limited or restricted Mobility: Walking and Moving Around Goal Status [phone](G8979): At least 20 percent but less than 40 percent impaired, limited or restricted    Aida RaiderWendy Cyrah Mclamb, PT  Office # [phone] Pager [phone]    Ilda FoilGarrow, Wanna Gully Rene 05/01/2017, 10:31 AM
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
## 2017-05-01 NOTE — Telephone Encounter (Addendum)
Spoke to her daughter, Elizabeth [name] - states her mother is currently in the hospital and will be going to rehab for PT this week rather than being discharged home.  She would like to keep her pending appt on 06/28/17 for now and will call back if her mother needs to be seen sooner.

## 2017-05-01 NOTE — Telephone Encounter (Signed)
Patients daughter called and requested to have her mom seen asap. They have been in the hospital due to falling and she wants to know why her apt was moved so far out. I explained to her that it looked like her mom had called and cancelled her 05/17 apt due to transportation the daughter said she knew nothing of this apt. She is requesting to speak with the nurse. Please call and advise.

## 2017-05-01 NOTE — Care Management Obs Status (Signed)
MEDICARE OBSERVATION STATUS NOTIFICATION   Patient Details  Name: Elizabeth [name] MRN: [id number] Date of Birth: 23-Jan-1941   Medicare Observation Status Notification Given:  Yes    Elliot CousinShavis, Kensi Karr Ellen, RN 05/01/2017, 4:46 PM

## 2017-05-02 ENCOUNTER — Inpatient Hospital Stay (HOSPITAL_COMMUNITY): Payer: Medicare Other

## 2017-05-02 DIAGNOSIS — R410 Disorientation, unspecified: Secondary | ICD-10-CM

## 2017-05-02 DIAGNOSIS — R1030 Lower abdominal pain, unspecified: Secondary | ICD-10-CM

## 2017-05-02 LAB — T4, FREE: Free T4: 0.92 ng/dL (ref 0.61–1.12)

## 2017-05-02 LAB — GLUCOSE, CAPILLARY: Glucose-Capillary: 87 mg/dL (ref 65–99)

## 2017-05-02 MED ORDER — POLYETHYLENE GLYCOL 3350 17 G PO PACK
17.0000 g | PACK | Freq: Two times a day (BID) | ORAL | Status: DC
  Administered 2017-05-02 – 2017-05-04 (×5): 17 g via ORAL
  Filled 2017-05-02 (×5): qty 1

## 2017-05-02 MED ORDER — BISACODYL 10 MG RE SUPP
10.0000 mg | Freq: Once | RECTAL | Status: AC
  Administered 2017-05-02: 10 mg via RECTAL
  Filled 2017-05-02: qty 1

## 2017-05-02 MED ORDER — FLEET ENEMA 7-19 GM/118ML RE ENEM
1.0000 | ENEMA | Freq: Every day | RECTAL | Status: DC | PRN
  Administered 2017-05-02: 1 via RECTAL
  Filled 2017-05-02: qty 1

## 2017-05-02 NOTE — Consult Note (Signed)
[facility] Primary Care Navigator  05/02/2017  Elizabeth [name] February 14, 1941 [id number]   Met with patient and her daughter Anderson Malta) to identify possible discharge needs. Patient's daughter reports that patient had been weak, had a fall episode and unable to get up that had led to this admission. Patient endorses Dr. Orpah Melter [facility]     as herprimary care provider.   [facility] [place] [facility] any [facility].   Patient states that shemanages her medications at home with son's assistance using "pill box" system.  Daughter reports that his brother Darnell Level) or patient's church friends usually providetransportation to herdoctors'appointments. Patient lives with son who assists with care (limited) per daughter.Patient's daughter lives in [place] and assists with patient if notified ahead of time as stated. [facility] list of personal care services provided as aresourcewhenneeded and notified of the need topay out of the pocket for it.  Plan for discharge is skilled nursing facility (still in process) for rehabilitation. Daughter mentioned that patient's son will be looking after patient until decision will be made for long term.  Patient and daughter voiced understanding to call primary care provider's office when she returns back home, for a post discharge follow-up appointment within a week or sooner if needs arise.Patient letter (with PCP's contact number) was provided as theirreminder.  Discussed withpatient and daughter about [facility] CM services available for health management but states having no other needs or concerns so far. Daughter expressed understanding to request from primary care provider a referral to [facility] care management if deemed necessary/ appropriate for services in the future. [facility] care management contact information provided for future needs that patient may  have.  For questions, please contact:  Dannielle Huh, BSN, RN- [facility] Primary Care Navigator  Telephone: [phone] [place]

## 2017-05-02 NOTE — Progress Notes (Addendum)
PROGRESS NOTE    Elizabeth [name]  UJW:[id number] DOB: [birth year] DOA: 04/30/2017 PCP: Joycelyn RuaMeyers, Stephen, MD   Outpatient Specialists:     Brief Narrative:  Elizabeth [name] is a 76 y.o. female who presents with generalized weakness and dehydration physical examination is relatively unremarkable except for pain with range of motion of the right arm. Patient reports a six-month history of same. She was found to be orthostatic and despite treatment in the ER was requiring continued therapy. Place in observation for management of orthostatic hypotension and will have x-rays of the right elbow, shoulder and neck done to evaluate pain with range of motion.   Assessment & Plan:   Active Problems:   Generalized weakness   Hypertension   Depression   Right arm pain   Anxiety   Orthostatic hypotension   Orthostatic hypotension -d/c hytrin -recheck this AM improved -IVF -no sign of infection -echo: Normal LV systolic function; mild diastolic dysfunction; trace   TR; mildly elevated pulmonary pressure.  Constipation -suppository -miralax  Anxiety/depression -continue wellbutrin  AMS -no sign of infection -suspect due to hypotension -appetite appears to be improving but still confused  Elevated TSH -mildly elevated TSH -repeat 6 weeks as free T4 normal  Severe protein calorie malnutrition  Lives at home with disabled son    DVT prophylaxis:  Lovenox   Code Status: Full Code   Family Communication: Called daughter  Disposition Plan:  SNF   Consultants:    Subjective: No SOB, no CP- still confused and has some lower abd pain  Objective: Vitals:   05/01/17 1448 05/01/17 2253 05/02/17 0500 05/02/17 0700  BP: (!) 146/62 (!) 142/88  (!) 144/93  Pulse: 97 (!) 116  (!) 105  Resp: (!) 21 (!) 22  20  Temp: 97.5 F (36.4 C)     TempSrc: Oral     SpO2: 100% 97%  96%  Weight:   60.5 kg (133 lb 4.8 oz)   Height:        Intake/Output Summary (Last 24  hours) at 05/02/17 1126 Last data filed at 05/02/17 1050  Gross per 24 hour  Intake              200 ml  Output              300 ml  Net             -100 ml   Filed Weights   04/30/17 0733 05/01/17 0556 05/02/17 0500  Weight: 59 kg (130 lb) 58.5 kg (129 lb) 60.5 kg (133 lb 4.8 oz)    Examination:  General exam: awake- speech difficult to understand Respiratory system: Clear to auscultation b/l, no wheezing Cardiovascular system: S1 & S2 heard, RRR. No JVD, murmurs, rubs, gallops or clicks. No pedal edema. Gastrointestinal system: +BS, tender in lower quadrant Central nervous system: alert but not oriented Extremities: Symmetric 5 x 5 power.     Data Reviewed: I have personally reviewed following labs and imaging studies  CBC:  Recent Labs Lab 04/30/17 0940 05/01/17 0715  WBC 7.4 7.0  NEUTROABS 6.3  --   HGB 11.4* 12.1  HCT 35.7* 38.5  MCV 85.2 85.9  PLT 260 249   Basic Metabolic Panel:  Recent Labs Lab 04/30/17 0940 05/01/17 0715  NA 140 140  K 4.0 3.6  CL 106 108  CO2 26 23  GLUCOSE 137* 95  BUN 11 6  CREATININE 0.92 0.70  CALCIUM 8.9 8.9   GFR: Estimated  Creatinine Clearance: 48.7 mL/min (by C-G formula based on SCr of 0.7 mg/dL). Liver Function Tests:  Recent Labs Lab 04/30/17 0940 05/01/17 0715  AST 24 23  ALT 18 19  ALKPHOS 58 63  BILITOT 0.4 0.5  PROT 5.9* 5.9*  ALBUMIN 3.3* 3.5   No results for input(s): LIPASE, AMYLASE in the last 168 hours. No results for input(s): AMMONIA in the last 168 hours. Coagulation Profile: No results for input(s): INR, PROTIME in the last 168 hours. Cardiac Enzymes: No results for input(s): CKTOTAL, CKMB, CKMBINDEX, TROPONINI in the last 168 hours. BNP (last 3 results) No results for input(s): PROBNP in the last 8760 hours. HbA1C: No results for input(s): HGBA1C in the last 72 hours. CBG:  Recent Labs Lab 05/02/17 0751  GLUCAP 87   Lipid Profile: No results for input(s): CHOL, HDL, LDLCALC,  TRIG, CHOLHDL, LDLDIRECT in the last 72 hours. Thyroid Function Tests:  Recent Labs  05/01/17 0941 05/02/17 0904  TSH 5.875*  --   FREET4  --  0.92   Anemia Panel: No results for input(s): VITAMINB12, FOLATE, FERRITIN, TIBC, IRON, RETICCTPCT in the last 72 hours. Urine analysis:    Component Value Date/Time   COLORURINE STRAW (A) 04/30/2017 0837   APPEARANCEUR CLEAR 04/30/2017 0837   LABSPEC 1.006 04/30/2017 0837   PHURINE 7.0 04/30/2017 0837   GLUCOSEU NEGATIVE 04/30/2017 0837   HGBUR NEGATIVE 04/30/2017 0837   BILIRUBINUR NEGATIVE 04/30/2017 0837   KETONESUR NEGATIVE 04/30/2017 0837   PROTEINUR NEGATIVE 04/30/2017 0837   UROBILINOGEN 1.0 06/18/2010 1234   NITRITE NEGATIVE 04/30/2017 0837   LEUKOCYTESUR NEGATIVE 04/30/2017 0837     )No results found for this or any previous visit (from the past 240 hour(s)).    Anti-infectives    None       Radiology Studies: Dg Neck Soft Tissue  Result Date: 04/30/2017 CLINICAL DATA:  Fall. EXAM: NECK SOFT TISSUES - 1+ VIEW COMPARISON:  CT cervical spine 11/21/2016 FINDINGS: Pharynx and hypopharynx are normal. Epiglottis and subglottic airway are normal. Prevertebral soft tissues are normal. Moderate degenerative change of the spine with fusion of vertebral bodies C5-C7 likely congenital. Mild disc space narrowing at the C3-4 level. Uncovertebral joint spurring and facet arthropathy. IMPRESSION: Unremarkable neck soft tissues. Moderate spondylosis of the cervical spine with disc disease at the C3-4 level. Vertebral body fusion C5 through C7. Electronically Signed   By: Elberta Fortis M.D.   On: 04/30/2017 16:49   Dg Shoulder Right  Result Date: 04/30/2017 CLINICAL DATA:  Recent fall yesterday with right shoulder pain. EXAM: RIGHT SHOULDER - 2+ VIEW COMPARISON:  Chest x-ray 06/11/2010 FINDINGS: Moderate degenerative change of the glenohumeral joint with likely prominent osteophyte along the medial femoral neck and less likely loose body  over the inferior aspect of the glenohumeral joint. Mild degenerative change of the [facility] joint. No acute fracture or dislocation. Degenerative changes spinal curvature of the thoracic spine convex right. IMPRESSION: No acute findings. Moderate degenerate change of the glenohumeral joint with prominent inferior osteophyte along the medial humeral neck. And mild degenerative change of the [facility] joint Electronically Signed   By: Elberta Fortis M.D.   On: 04/30/2017 16:46   Dg Elbow 2 Views Left  Result Date: 04/30/2017 CLINICAL DATA:  Fall. EXAM: LEFT ELBOW - 2 VIEW COMPARISON:  None. FINDINGS: There is no evidence of fracture, dislocation, or joint effusion. There is no evidence of arthropathy or other focal bone abnormality. Soft tissues are unremarkable. IMPRESSION: No acute findings. Electronically Signed  By: Elberta Fortis M.D.   On: 04/30/2017 16:51   Dg Abd Portable 1v  Result Date: 05/02/2017 CLINICAL DATA:  Lower abdominal pain. EXAM: PORTABLE ABDOMEN - 1 VIEW COMPARISON:  09/03/2015 FINDINGS: There is extensive stool in the colon with the exception of the sigmoid and rectum. There are no dilated loops of large small bowel. No free air or free fluid visible on this supine radiograph. Multiple surgical clips in the right side of the pelvis. IMPRESSION: Extensive stool in the colon. No fecal impaction. Otherwise benign appearing abdomen. Electronically Signed   By: Francene Boyers M.D.   On: 05/02/2017 08:48        Scheduled Meds: . buPROPion  150 mg Oral Daily  . enoxaparin (LOVENOX) injection  40 mg Subcutaneous Q24H  . nortriptyline  150 mg Oral QHS  . polyethylene glycol  17 g Oral BID   Continuous Infusions: . sodium chloride 100 mL/hr at 05/02/17 0756     LOS: 1 day    Time spent: 25 min    Jahziel Sinn U Mackay Hanauer, DO [facility] Pager [phone]  If 7PM-7AM, please contact night-coverage [email or web address] Password [facility] 05/02/2017, 11:26 AM

## 2017-05-03 DIAGNOSIS — R531 Weakness: Secondary | ICD-10-CM

## 2017-05-03 DIAGNOSIS — W19XXXA Unspecified fall, initial encounter: Secondary | ICD-10-CM

## 2017-05-03 LAB — BASIC METABOLIC PANEL
Anion gap: 7 (ref 5–15)
BUN: 8 mg/dL (ref 6–20)
CO2: 25 mmol/L (ref 22–32)
Calcium: 9 mg/dL (ref 8.9–10.3)
Chloride: 107 mmol/L (ref 101–111)
Creatinine, Ser: 0.78 mg/dL (ref 0.44–1.00)
GFR calc Af Amer: >60 mL/min (ref >60)
GFR calc non Af Amer: >60 mL/min (ref >60)
Glucose, Bld: 102 mg/dL — ABNORMAL HIGH (ref 65–99)
Potassium: 3.8 mmol/L (ref 3.5–5.1)
Sodium: 139 mmol/L (ref 135–145)

## 2017-05-03 LAB — CBC
HCT: 36.3 % (ref 36.0–46.0)
Hemoglobin: 11.5 g/dL — ABNORMAL LOW (ref 12.0–15.0)
MCH: 26.9 pg (ref 26.0–34.0)
MCHC: 31.7 g/dL (ref 30.0–36.0)
MCV: 85 fL (ref 78.0–100.0)
Platelets: 261 10*3/uL (ref 150–400)
RBC: 4.27 MIL/uL (ref 3.87–5.11)
RDW: 15.4 % (ref 11.5–15.5)
WBC: 8 10*3/uL (ref 4.0–10.5)

## 2017-05-03 NOTE — NC FL2 (Signed)
[place] MEDICAID FL2 LEVEL OF CARE SCREENING TOOL     IDENTIFICATION  Patient Name: Elizabeth [name] Birthdate: 1941-11-18 Sex: female Admission Date (Current Location): 04/30/2017  [facility] and [place] Number:  Producer, television/film/video and Address:  [facility] [place], [address] [place], [place] [address]      Provider Number: [id number]  Attending Physician Name and Address:  Joseph Art, DO  Relative Name and Phone Number:  Victorino Dike, daughter, [phone]    Current Level of Care: Hospital Recommended Level of Care: Skilled Nursing Facility Prior Approval Number:    Date Approved/Denied:   PASRR Number: [id number]  Discharge Plan: SNF    Current Diagnoses: Patient Active Problem List   Diagnosis Date Noted  . Orthostatic hypotension 05/01/2017  . Generalized weakness 04/30/2017  . Hypertension 04/30/2017  . Depression 04/30/2017  . Right arm pain 04/30/2017  . Anxiety 04/30/2017  . Passed out 02/06/2017  . Memory loss 02/06/2017    Orientation RESPIRATION BLADDER Height & Weight     Self, Place  Normal Continent Weight: 59.8 kg (131 lb 12.8 oz) Height:  5\' 3"  (160 cm)  BEHAVIORAL SYMPTOMS/MOOD NEUROLOGICAL BOWEL NUTRITION STATUS      Continent Diet (Please see DC Summary)  AMBULATORY STATUS COMMUNICATION OF NEEDS Skin   Limited Assist Verbally Normal                       Personal Care Assistance Level of Assistance  Bathing, Feeding, Dressing Bathing Assistance: Limited assistance Feeding assistance: Independent Dressing Assistance: Limited assistance     Functional Limitations Info             SPECIAL CARE FACTORS FREQUENCY  PT (By licensed PT)     PT Frequency: 5x/week              Contractures      Additional Factors Info  Code Status, Allergies, Psychotropic Code Status Info: Full Allergies Info: Norvasc Amlodipine, Benicar Olmesartan, Lisinopril Psychotropic Info: Wellbutrin          Current Medications (05/03/2017):  This is the current hospital active medication list Current Facility-Administered Medications  Medication Dose Route Frequency Provider Last Rate Last Dose  . 0.9 %  sodium chloride infusion   Intravenous Continuous Marcos Eke, PA-C 100 mL/hr at 05/03/17 0600    . acetaminophen (TYLENOL) tablet 650 mg  650 mg Oral Q6H PRN Marcos Eke, PA-C       Or  . acetaminophen (TYLENOL) suppository 650 mg  650 mg Rectal Q6H PRN Marcos Eke, PA-C      . bisacodyl (DULCOLAX) suppository 10 mg  10 mg Rectal Daily PRN Marcos Eke, PA-C      . buPROPion (WELLBUTRIN XL) 24 hr tablet 150 mg  150 mg Oral Daily Marlowe Kays E, PA-C   150 mg at 05/02/17 1022  . enoxaparin (LOVENOX) injection 40 mg  40 mg Subcutaneous Q24H Marcos Eke, PA-C   40 mg at 05/02/17 1437  . nortriptyline (PAMELOR) capsule 150 mg  150 mg Oral QHS Marcos Eke, PA-C   150 mg at 05/03/17 0248  . ondansetron (ZOFRAN) tablet 4 mg  4 mg Oral Q6H PRN Marlowe Kays E, PA-C       Or  . ondansetron (ZOFRAN) injection 4 mg  4 mg Intravenous Q6H PRN Marlowe Kays E, PA-C      . polyethylene glycol (MIRALAX / GLYCOLAX) packet 17 g  17 g Oral BID Joseph ArtVann, Jessica U, DO   17 g at 05/02/17 2113  . senna-docusate (Senokot-S) tablet 1 tablet  1 tablet Oral QHS PRN Marcos EkeWertman, Sara E, PA-C      . sodium phosphate (FLEET) 7-19 GM/118ML enema 1 enema  1 enema Rectal Daily PRN Marlin CanaryVann, Jessica U, DO   1 enema at 05/02/17 1021     Discharge Medications: Please see discharge summary for a list of discharge medications.  Relevant Imaging Results:  Relevant Lab Results:   Additional Information SSN: [id number]2898  Taela Charbonneau S Brookhaven[place]yan, [place]WA

## 2017-05-03 NOTE — Progress Notes (Signed)
Physical Therapy Treatment Patient Details Name: Elizabeth [name] MRN: [id number] DOB: Aug 29, 1941 Today's Date: 05/03/2017    History of Present Illness Pt is a 76 y.o. female with medical history significant for HTN, HLD, , depression, IBS, insomnia, anxiety, headaches, mild dementia, and congenital absence of the left hand.  Pt presented to the ED with dizziness, generalized weakness and s/p multiple falls.    PT Comments    Pt continues to become dizzy after apprx 2 mins of standing. Was able to ambulate 20' within room with min HHA and ataxic gait pattern. Pt with posterior bias, cannot sit EOB safely without posterior LOB. She reports hat PTA she went bowling 3x/ week and does not understand what is happening to her and her balance. PT will continue to follow.     Follow Up Recommendations  SNF;Supervision/Assistance - 24 hour     Equipment Recommendations  Cane    Recommendations for Other Services       Precautions / Restrictions Precautions Precautions: Fall Precaution Comments: watch BP Restrictions Weight Bearing Restrictions: No    Mobility  Bed Mobility Overal bed mobility: Needs Assistance Bed Mobility: Supine to Sit;Sit to Supine     Supine to sit: Supervision Sit to supine: Supervision   General bed mobility comments: increased time needed with use of rail, pt fell bkwds several times before achieving full sitting  Transfers Overall transfer level: Needs assistance Equipment used: 1 person hand held assist Transfers: Sit to/from Stand Sit to Stand: Min assist;+2 safety/equipment Stand pivot transfers: Min assist;+2 safety/equipment       General transfer comment: posterior lean and ataxic in standing. [facility], +2 for IV pole mgmt  Ambulation/Gait Ambulation/Gait assistance: Min assist;+2 safety/equipment Ambulation Distance (Feet): 20 Feet Assistive device: 1 person hand held assist Gait Pattern/deviations: Step-through  pattern;Ataxic   Gait velocity interpretation: Below normal speed for age/gender General Gait Details: had to lie back down on bed after using BSC before she could get back up to ambulate, this due to dizziness that she describes not as spinning but as unsteady like she's on the sea   Stairs            Wheelchair Mobility    Modified Rankin (Stroke Patients Only)       Balance Overall balance assessment: Needs assistance Sitting-balance support: No upper extremity supported;Feet supported Sitting balance-Leahy Scale: Fair Sitting balance - Comments: loses balance posterior in sitting and corrects with use of rail Postural control: Posterior lean Standing balance support: Single extremity supported;During functional activity Standing balance-Leahy Scale: Poor Standing balance comment: retropulsive, unsteady, pt reports dizziness               High Level Balance Comments: worked on standing with feet together which pt had much difficulty with. She did better with feet apart and eyes closed.              Cognition Arousal/Alertness: Awake/alert Behavior During Therapy: Anxious Overall Cognitive Status: Impaired/Different from baseline Area of Impairment: Orientation;Attention;Memory;Safety/judgement;Following commands;Awareness;Problem solving                 Orientation Level: Disoriented to;Situation Current Attention Level: Selective Memory: Decreased short-term memory;Decreased recall of precautions Following Commands: Follows one step commands consistently;Follows one step commands with increased time Safety/Judgement: Decreased awareness of safety Awareness: Emergent Problem Solving: Difficulty sequencing;Requires verbal cues General Comments: tangential speech at times with some nonsensical comments but also had times when she was very clear and  was describing the difference between her baseline of function and her current situation      Exercises       General Comments General comments (skin integrity, edema, etc.): BP in supine 141/89. Attempted to get BP in sitting but pt very jittery and could not get accurate reading.       Pertinent Vitals/Pain Pain Assessment: No/denies pain    Home Living                      Prior Function            PT Goals (current goals can now be found in the care plan section) Acute Rehab PT Goals Patient Stated Goal: return to bowling PT Goal Formulation: With patient/family Time For Goal Achievement: 05/14/17 Potential to Achieve Goals: Good Progress towards PT goals: Progressing toward goals    Frequency    Min 3X/week      PT Plan Current plan remains appropriate    Co-evaluation              AM-PAC PT "6 Clicks" Daily Activity  Outcome Measure  Difficulty turning over in bed (including adjusting bedclothes, sheets and blankets)?: A Little Difficulty moving from lying on back to sitting on the side of the bed? : Total Difficulty sitting down on and standing up from a chair with arms (e.g., wheelchair, bedside commode, etc,.)?: Total Help needed moving to and from a bed to chair (including a wheelchair)?: A Little Help needed walking in hospital room?: A Lot Help needed climbing 3-5 steps with a railing? : Total 6 Click Score: 11    End of Session Equipment Utilized During Treatment: Gait belt Activity Tolerance: Patient tolerated treatment well Patient left: in bed;with call bell/phone within reach;with bed alarm set Nurse Communication: Mobility status;Other (comment) (500 cc urine) PT Visit Diagnosis: Unsteadiness on feet (R26.81);Dizziness and giddiness (R42);Difficulty in walking, not elsewhere classified (R26.2)     Time: 5784-6962 PT Time Calculation (min) (ACUTE ONLY): 35 min  Charges:  $Gait Training: 8-22 mins $Therapeutic Activity: 8-22 mins                    G Codes:       Lyanne Co, PT  Acute Rehab Services   [phone]    Islandia L Torrez Renfroe 05/03/2017, 2:01 PM

## 2017-05-03 NOTE — Clinical Social Work Note (Signed)
Clinical Social Work Assessment  Patient Details  Name: Elizabeth [name] MRN: [id number] Date of Birth: 1941-06-24  Date of referral:  05/03/17               Reason for consult:  Facility Placement                Permission sought to share information with:  Facility Medical sales representativeContact Representative, Family Supports Permission granted to share information::  Yes, Verbal Permission Granted  Name::     Conservation officer, historic buildingsJennifer  Agency::  SNFs  Relationship::  Daughter  Contact Information:  [phone]  Housing/Transportation Living arrangements for the past 2 months:  Single Family Home Source of Information:  Patient, Adult Children Patient Interpreter Needed:  None Criminal Activity/Legal Involvement Pertinent to Current Situation/Hospitalization:  No - Comment as needed Significant Relationships:  Adult Children Lives with:  Adult Children Do you feel safe going back to the place where you live?  No Need for family participation in patient care:  Yes (Comment)  Care giving concerns:  CSW received consult for possible SNF placement at time of discharge. CSW spoke with patient's daughter, Victorino DikeJennifer, regarding PT recommendation of SNF placement at time of discharge. Patient's daughter reported that patient's son is currently unable to care for patient at their home given patient's current physical needs and fall risk. Patient has never been to SNF before. Patient expressed understanding of PT recommendation and is agreeable to SNF placement at time of discharge. CSW to continue to follow and assist with discharge planning needs.   Social Worker assessment / plan:  CSW spoke with patient's daughter concerning [facility] before [facility] home.  Employment status:  Retired Health and safety inspectornsurance information:  Medicare PT Recommendations:  Skilled Nursing Facility Information / Referral to community resources:  Skilled Nursing Facility  Patient/Family's Response to care:  Patient's family recognizes need  for rehab before returning home and [place]. CSW emailed daughter a list of SNF facilities.   Patient/Family's Understanding of and Emotional Response to Diagnosis, Current Treatment, and Prognosis:  Patient/family is realistic regarding therapy needs and expressed being hopeful for SNF placement. Patient expressed understanding of CSW role and discharge process as well as her medical condition. No questions/concerns about plan or treatment.    Emotional Assessment Appearance:  Appears stated age Attitude/Demeanor/Rapport:  Unable to Assess Affect (typically observed):  Unable to Assess Orientation:  Oriented to Self, Oriented to Place Alcohol / Substance use:  Not Applicable Psych involvement (Current and /or in the community):  No (Comment)  Discharge Needs  Concerns to be addressed:  Care Coordination Readmission within the last 30 days:  No Current discharge risk:  None Barriers to Discharge:  Continued Medical Work up   [employer] S Admiral Marcucci, LCSWA 05/03/2017, 9:24 AM

## 2017-05-03 NOTE — Progress Notes (Signed)
PROGRESS NOTE    Elizabeth [name]  ZOX:[id number] DOB: 03-04-1941 DOA: 04/30/2017 PCP: Joycelyn RuaMeyers, Stephen, MD   Outpatient Specialists:     Brief Narrative:  Elizabeth [name] is a 76 y.o. female who presents with generalized weakness and dehydration physical examination is relatively unremarkable except for pain with range of motion of the right arm. Patient reports a six-month history of same. She was found to be orthostatic and despite treatment in the ER was requiring continued therapy. Had been on hytrin.     Assessment & Plan:   Active Problems:   Generalized weakness   Hypertension   Depression   Right arm pain   Anxiety   Orthostatic hypotension   Orthostatic hypotension -d/c hytrin -recheck this AM- improved yesterday -IVF -no sign of infection -echo: Normal LV systolic function; mild diastolic dysfunction; trace   TR; mildly elevated pulmonary pressure.  Constipation -suppository -miralax -had 3 BMs  Underlying dementia -suspect has been worsening over last 6 months -follow with Dr. Terrace ArabiaYan  Anxiety/depression -continue wellbutrin  AMS -no sign of infection -suspect due to hypotension and dementia  Elevated TSH -mildly elevated TSH -repeat 6 weeks as free T4 normal  Severe protein calorie malnutrition  Lives at home with disabled son-- will need SNF    DVT prophylaxis:  Lovenox   Code Status: Full Code   Family Communication: Daughter at bedside  Disposition Plan:  SNF   Consultants:    Subjective: Stomach feels better after BMs  Objective: Vitals:   05/02/17 0700 05/02/17 1350 05/02/17 2224 05/03/17 0656  BP: (!) 144/93 (!) 155/76 (!) 159/72 (!) 151/66  Pulse: (!) 105 (!) 101 (!) 101 100  Resp: 20 15 20 17   Temp:  98.4 F (36.9 C)  98.3 F (36.8 C)  TempSrc:    Oral  SpO2: 96% 99% 97% 97%  Weight:    59.8 kg (131 lb 12.8 oz)  Height:        Intake/Output Summary (Last 24 hours) at 05/03/17 1202 Last data filed at  05/03/17 [id number]  Gross per 24 hour  Intake          1491.67 ml  Output             2525 ml  Net         -1033.33 ml   Filed Weights   05/01/17 0556 05/02/17 0500 05/03/17 0656  Weight: 58.5 kg (129 lb) 60.5 kg (133 lb 4.8 oz) 59.8 kg (131 lb 12.8 oz)    Examination:  General exam: awake- speech difficult to understand at times-- mumbles Respiratory system: Clear to auscultation b/l, no wheezing Cardiovascular system: S1 & S2 heard, RRR. No JVD, murmurs, rubs, gallops or clicks. No pedal edema. Gastrointestinal system: +BS, non tender Central nervous system: A+Ox3 Extremities: moves all 4 ext     Data Reviewed: I have personally reviewed following labs and imaging studies  CBC:  Recent Labs Lab 04/30/17 0940 05/01/17 0715 05/03/17 0433  WBC 7.4 7.0 8.0  NEUTROABS 6.3  --   --   HGB 11.4* 12.1 11.5*  HCT 35.7* 38.5 36.3  MCV 85.2 85.9 85.0  PLT 260 249 261   Basic Metabolic Panel:  Recent Labs Lab 04/30/17 0940 05/01/17 0715 05/03/17 0433  NA 140 140 139  K 4.0 3.6 3.8  CL 106 108 107  CO2 26 23 25   GLUCOSE 137* 95 102*  BUN 11 6 8   CREATININE 0.92 0.70 0.78  CALCIUM 8.9 8.9 9.0  GFR: Estimated Creatinine Clearance: 48.7 mL/min (by C-G formula based on SCr of 0.78 mg/dL). Liver Function Tests:  Recent Labs Lab 04/30/17 0940 05/01/17 0715  AST 24 23  ALT 18 19  ALKPHOS 58 63  BILITOT 0.4 0.5  PROT 5.9* 5.9*  ALBUMIN 3.3* 3.5   No results for input(s): LIPASE, AMYLASE in the last 168 hours. No results for input(s): AMMONIA in the last 168 hours. Coagulation Profile: No results for input(s): INR, PROTIME in the last 168 hours. Cardiac Enzymes: No results for input(s): CKTOTAL, CKMB, CKMBINDEX, TROPONINI in the last 168 hours. BNP (last 3 results) No results for input(s): PROBNP in the last 8760 hours. HbA1C: No results for input(s): HGBA1C in the last 72 hours. CBG:  Recent Labs Lab 05/02/17 0751  GLUCAP 87   Lipid Profile: No results  for input(s): CHOL, HDL, LDLCALC, TRIG, CHOLHDL, LDLDIRECT in the last 72 hours. Thyroid Function Tests:  Recent Labs  05/01/17 0941 05/02/17 0904  TSH 5.875*  --   FREET4  --  0.92   Anemia Panel: No results for input(s): VITAMINB12, FOLATE, FERRITIN, TIBC, IRON, RETICCTPCT in the last 72 hours. Urine analysis:    Component Value Date/Time   COLORURINE STRAW (A) 04/30/2017 0837   APPEARANCEUR CLEAR 04/30/2017 0837   LABSPEC 1.006 04/30/2017 0837   PHURINE 7.0 04/30/2017 0837   GLUCOSEU NEGATIVE 04/30/2017 0837   HGBUR NEGATIVE 04/30/2017 0837   BILIRUBINUR NEGATIVE 04/30/2017 0837   KETONESUR NEGATIVE 04/30/2017 0837   PROTEINUR NEGATIVE 04/30/2017 0837   UROBILINOGEN 1.0 06/18/2010 1234   NITRITE NEGATIVE 04/30/2017 0837   LEUKOCYTESUR NEGATIVE 04/30/2017 0837     )No results found for this or any previous visit (from the past 240 hour(s)).    Anti-infectives    None       Radiology Studies: Dg Abd Portable 1v  Result Date: 05/02/2017 CLINICAL DATA:  Lower abdominal pain. EXAM: PORTABLE ABDOMEN - 1 VIEW COMPARISON:  09/03/2015 FINDINGS: There is extensive stool in the colon with the exception of the sigmoid and rectum. There are no dilated loops of large small bowel. No free air or free fluid visible on this supine radiograph. Multiple surgical clips in the right side of the pelvis. IMPRESSION: Extensive stool in the colon. No fecal impaction. Otherwise benign appearing abdomen. Electronically Signed   By: Francene Boyers M.D.   On: 05/02/2017 08:48        Scheduled Meds: . buPROPion  150 mg Oral Daily  . enoxaparin (LOVENOX) injection  40 mg Subcutaneous Q24H  . nortriptyline  150 mg Oral QHS  . polyethylene glycol  17 g Oral BID   Continuous Infusions: . sodium chloride 100 mL/hr at 05/03/17 0600     LOS: 2 days    Time spent: 25 min    JESSICA U VANN, DO [facility] Pager [phone]  If 7PM-7AM, please contact  night-coverage [email or web address] Password [email or web address] 05/03/2017, 12:02 PM

## 2017-05-04 ENCOUNTER — Ambulatory Visit: Payer: Self-pay | Admitting: Neurology

## 2017-05-04 DIAGNOSIS — K589 Irritable bowel syndrome without diarrhea: Secondary | ICD-10-CM | POA: Diagnosis present

## 2017-05-04 DIAGNOSIS — D649 Anemia, unspecified: Secondary | ICD-10-CM | POA: Diagnosis not present

## 2017-05-04 DIAGNOSIS — Z9842 Cataract extraction status, left eye: Secondary | ICD-10-CM | POA: Diagnosis not present

## 2017-05-04 DIAGNOSIS — I10 Essential (primary) hypertension: Secondary | ICD-10-CM | POA: Diagnosis present

## 2017-05-04 DIAGNOSIS — I5020 Unspecified systolic (congestive) heart failure: Secondary | ICD-10-CM | POA: Diagnosis not present

## 2017-05-04 DIAGNOSIS — M6281 Muscle weakness (generalized): Secondary | ICD-10-CM | POA: Diagnosis not present

## 2017-05-04 DIAGNOSIS — G9340 Encephalopathy, unspecified: Secondary | ICD-10-CM | POA: Diagnosis not present

## 2017-05-04 DIAGNOSIS — N390 Urinary tract infection, site not specified: Secondary | ICD-10-CM | POA: Diagnosis not present

## 2017-05-04 DIAGNOSIS — Z90710 Acquired absence of both cervix and uterus: Secondary | ICD-10-CM | POA: Diagnosis not present

## 2017-05-04 DIAGNOSIS — I6789 Other cerebrovascular disease: Secondary | ICD-10-CM | POA: Diagnosis not present

## 2017-05-04 DIAGNOSIS — E039 Hypothyroidism, unspecified: Secondary | ICD-10-CM | POA: Diagnosis not present

## 2017-05-04 DIAGNOSIS — E785 Hyperlipidemia, unspecified: Secondary | ICD-10-CM | POA: Diagnosis present

## 2017-05-04 DIAGNOSIS — Z803 Family history of malignant neoplasm of breast: Secondary | ICD-10-CM | POA: Diagnosis not present

## 2017-05-04 DIAGNOSIS — R1030 Lower abdominal pain, unspecified: Secondary | ICD-10-CM | POA: Diagnosis not present

## 2017-05-04 DIAGNOSIS — Z888 Allergy status to other drugs, medicaments and biological substances status: Secondary | ICD-10-CM | POA: Diagnosis not present

## 2017-05-04 DIAGNOSIS — E43 Unspecified severe protein-calorie malnutrition: Secondary | ICD-10-CM | POA: Diagnosis not present

## 2017-05-04 DIAGNOSIS — I959 Hypotension, unspecified: Secondary | ICD-10-CM | POA: Diagnosis not present

## 2017-05-04 DIAGNOSIS — R471 Dysarthria and anarthria: Secondary | ICD-10-CM | POA: Diagnosis not present

## 2017-05-04 DIAGNOSIS — G309 Alzheimer's disease, unspecified: Secondary | ICD-10-CM | POA: Diagnosis present

## 2017-05-04 DIAGNOSIS — R410 Disorientation, unspecified: Secondary | ICD-10-CM | POA: Diagnosis not present

## 2017-05-04 DIAGNOSIS — F329 Major depressive disorder, single episode, unspecified: Secondary | ICD-10-CM | POA: Diagnosis present

## 2017-05-04 DIAGNOSIS — Q7102 Congenital complete absence of left upper limb: Secondary | ICD-10-CM | POA: Diagnosis not present

## 2017-05-04 DIAGNOSIS — F419 Anxiety disorder, unspecified: Secondary | ICD-10-CM | POA: Diagnosis present

## 2017-05-04 DIAGNOSIS — R531 Weakness: Secondary | ICD-10-CM | POA: Diagnosis not present

## 2017-05-04 DIAGNOSIS — E559 Vitamin D deficiency, unspecified: Secondary | ICD-10-CM | POA: Diagnosis present

## 2017-05-04 DIAGNOSIS — Z66 Do not resuscitate: Secondary | ICD-10-CM | POA: Diagnosis present

## 2017-05-04 DIAGNOSIS — F0390 Unspecified dementia without behavioral disturbance: Secondary | ICD-10-CM | POA: Diagnosis not present

## 2017-05-04 DIAGNOSIS — M25551 Pain in right hip: Secondary | ICD-10-CM | POA: Diagnosis not present

## 2017-05-04 DIAGNOSIS — Z9181 History of falling: Secondary | ICD-10-CM | POA: Diagnosis not present

## 2017-05-04 DIAGNOSIS — R41841 Cognitive communication deficit: Secondary | ICD-10-CM | POA: Diagnosis not present

## 2017-05-04 DIAGNOSIS — R270 Ataxia, unspecified: Secondary | ICD-10-CM | POA: Diagnosis present

## 2017-05-04 DIAGNOSIS — K5900 Constipation, unspecified: Secondary | ICD-10-CM | POA: Diagnosis not present

## 2017-05-04 DIAGNOSIS — M79601 Pain in right arm: Secondary | ICD-10-CM | POA: Diagnosis not present

## 2017-05-04 DIAGNOSIS — R4182 Altered mental status, unspecified: Secondary | ICD-10-CM | POA: Diagnosis not present

## 2017-05-04 DIAGNOSIS — R2689 Other abnormalities of gait and mobility: Secondary | ICD-10-CM | POA: Diagnosis not present

## 2017-05-04 DIAGNOSIS — E860 Dehydration: Secondary | ICD-10-CM | POA: Diagnosis not present

## 2017-05-04 DIAGNOSIS — R278 Other lack of coordination: Secondary | ICD-10-CM | POA: Diagnosis not present

## 2017-05-04 DIAGNOSIS — F0280 Dementia in other diseases classified elsewhere without behavioral disturbance: Secondary | ICD-10-CM | POA: Diagnosis present

## 2017-05-04 DIAGNOSIS — K219 Gastro-esophageal reflux disease without esophagitis: Secondary | ICD-10-CM | POA: Diagnosis present

## 2017-05-04 DIAGNOSIS — M1611 Unilateral primary osteoarthritis, right hip: Secondary | ICD-10-CM | POA: Diagnosis not present

## 2017-05-04 DIAGNOSIS — I951 Orthostatic hypotension: Secondary | ICD-10-CM | POA: Diagnosis not present

## 2017-05-04 DIAGNOSIS — W19XXXA Unspecified fall, initial encounter: Secondary | ICD-10-CM | POA: Diagnosis not present

## 2017-05-04 DIAGNOSIS — Z9841 Cataract extraction status, right eye: Secondary | ICD-10-CM | POA: Diagnosis not present

## 2017-05-04 DIAGNOSIS — R4781 Slurred speech: Secondary | ICD-10-CM | POA: Diagnosis not present

## 2017-05-04 MED ORDER — BISACODYL 10 MG RE SUPP: 10.0000 mg | Freq: Every day | RECTAL | 0 refills | Status: DC | PRN

## 2017-05-04 MED ORDER — MECLIZINE HCL 25 MG PO TABS
12.5000 mg | ORAL_TABLET | Freq: Two times a day (BID) | ORAL | Status: DC
  Administered 2017-05-04: 12.5 mg via ORAL
  Filled 2017-05-04: qty 1

## 2017-05-04 MED ORDER — POLYETHYLENE GLYCOL 3350 17 G PO PACK: 17.0000 g | PACK | Freq: Every day | ORAL | 0 refills | Status: DC

## 2017-05-04 MED ORDER — MECLIZINE HCL 12.5 MG PO TABS: 12.5000 mg | ORAL_TABLET | Freq: Two times a day (BID) | ORAL | 0 refills | Status: DC | PRN

## 2017-05-04 NOTE — Progress Notes (Signed)
NURSING PROGRESS NOTE  Kennieth Radmily R Ned [id number] Discharge Data: 05/04/2017 3:17 PM Attending Provider: Joseph ArtVann, Jessica U, DO JXB:JYNWGNPCP:Meyers, Jeannett SeniorStephen, MD     Kennieth RadEmily R Wolf to be D[facility]C'd [facility] order.  Discussed with the patient the After Visit Summary and all questions fully answered. All IV's discontinued with no bleeding noted. All belongings returned to patient for patient to take home. [facility]Lisa[facility]  Last Vital Signs:  Blood pressure 139/60, pulse (!) 101, temperature 98.1 F (36.7 C), temperature source Oral, resp. rate 20, height 5\' 3"  (1.6 m), weight 59 kg (130 lb), SpO2 99 %.  Discharge Medication List Allergies as of 05/04/2017      Reactions   Norvasc [amlodipine] Swelling   Benicar [olmesartan] Other (See Comments)   Nervousness   Lisinopril Cough      Medication List    STOP taking these medications   ibuprofen 200 MG tablet Commonly known as:  ADVIL,MOTRIN   terazosin 1 MG capsule Commonly known as:  HYTRIN     TAKE these medications   bisacodyl 10 MG suppository Commonly known as:  DULCOLAX Place 1 suppository (10 mg total) rectally daily as needed for moderate constipation.   buPROPion 150 MG 24 hr tablet Commonly known as:  WELLBUTRIN XL Take 150 mg by mouth daily.   meclizine 12.5 MG tablet Commonly known as:  ANTIVERT Take 1 tablet (12.5 mg total) by mouth 2 (two) times daily as needed for dizziness.   nortriptyline 75 MG capsule Commonly known as:  PAMELOR Take 150 mg by mouth at bedtime.   polyethylene glycol packet Commonly known as:  MIRALAX / GLYCOLAX Take 17 g by mouth daily.

## 2017-05-04 NOTE — Discharge Summary (Signed)
Physician Discharge Summary  Elizabeth [name] ZOX:[id number] DOB: 1941-08-23 DOA: 04/30/2017  PCP: Elizabeth Rua, MD  Admit date: 04/30/2017 Discharge date: 05/04/2017   Recommendations for Outpatient Follow-Up:   1. Close neurology follow up with Dr. Terrace Arabia- asked for appointment in 2 weeks 2. TSH 6 weeks 3. TED hose   Discharge Diagnosis:   Active Problems:   Generalized weakness   Hypertension   Depression   Right arm pain   Anxiety   Orthostatic hypotension   Discharge disposition:  Home.    Discharge Condition: Improved.  Diet recommendation: Regular.  Wound care: None.   History of Present Illness:   Elizabeth [name] is a 76 y.o. female who presents with generalized weakness and dehydration physical examination is relatively unremarkable except for pain with range of motion of the right arm. Patient reports a six-month history of same. She was found to be orthostatic and despite treatment in the ER was requiring continued therapy. Place in observation for management of orthostatic hypotension and will have x-rays of the right elbow, shoulder and neck done to evaluate pain with range of motion.   Hospital Course by Problem:   Orthostatic hypotension -d/c hytrin -improving -IVF -no sign of infection -echo: Normal LV systolic function; mild diastolic dysfunction; trace TR; mildly elevated pulmonary pressure.  Constipation -suppository -miralax  Underlying dementia -suspect has been worsening over last 6 months after head injury -follow with Dr. Terrace Arabia- see in about 2 weeks  Anxiety/depression -continue wellbutrin  AMS -no sign of infection -suspect due to hypotension and dementia- improving  Elevated TSH -mildly elevated TSH -repeat 6 weeks as free T4 normal  Severe protein calorie malnutrition    Medical Consultants:    None.   Discharge Exam:   Vitals:   05/03/17 2113 05/04/17 0531  BP: (!) 166/66 (!) 112/56  Pulse: (!)  102 93  Resp: 20 (!) 24  Temp: 98.5 F (36.9 C) 97.8 F (36.6 C)   Vitals:   05/03/17 1211 05/03/17 2113 05/04/17 0231 05/04/17 0531  BP: 134/62 (!) 166/66  (!) 112/56  Pulse: 97 (!) 102  93  Resp: 20 20  (!) 24  Temp: 98.1 F (36.7 C) 98.5 F (36.9 C)  97.8 F (36.6 C)  TempSrc: Oral   Oral  SpO2: 98% 99%  98%  Weight:   59 kg (130 lb)   Height:        Gen:  NAD   The results of significant diagnostics from this hospitalization (including imaging, microbiology, ancillary and laboratory) are listed below for reference.     Procedures and Diagnostic Studies:   Dg Neck Soft Tissue  Result Date: 04/30/2017 CLINICAL DATA:  Fall. EXAM: NECK SOFT TISSUES - 1+ VIEW COMPARISON:  CT cervical spine 11/21/2016 FINDINGS: Pharynx and hypopharynx are normal. Epiglottis and subglottic airway are normal. Prevertebral soft tissues are normal. Moderate degenerative change of the spine with fusion of vertebral bodies C5-C7 likely congenital. Mild disc space narrowing at the C3-4 level. Uncovertebral joint spurring and facet arthropathy. IMPRESSION: Unremarkable neck soft tissues. Moderate spondylosis of the cervical spine with disc disease at the C3-4 level. Vertebral body fusion C5 through C7. Electronically Signed   By: Elberta Fortis M.D.   On: 04/30/2017 16:49   Dg Shoulder Right  Result Date: 04/30/2017 CLINICAL DATA:  Recent fall yesterday with right shoulder pain. EXAM: RIGHT SHOULDER - 2+ VIEW COMPARISON:  Chest x-ray 06/11/2010 FINDINGS: Moderate degenerative change of the glenohumeral joint with likely prominent osteophyte  along the medial femoral neck and less likely loose body over the inferior aspect of the glenohumeral joint. Mild degenerative change of the [facility]. No acute fracture or dislocation. Degenerative changes spinal curvature of the thoracic spine convex right. IMPRESSION: No acute findings. Moderate degenerate change of the glenohumeral joint with prominent inferior  osteophyte along the medial humeral neck. And mild degenerative change of the [facility] joint Electronically Signed   By: Elberta Fortisaniel  Boyle M.D.   On: 04/30/2017 16:46   Dg Elbow 2 Views Left  Result Date: 04/30/2017 CLINICAL DATA:  Fall. EXAM: LEFT ELBOW - 2 VIEW COMPARISON:  None. FINDINGS: There is no evidence of fracture, dislocation, or joint effusion. There is no evidence of arthropathy or other focal bone abnormality. Soft tissues are unremarkable. IMPRESSION: No acute findings. Electronically Signed   By: Elberta Fortisaniel  Boyle M.D.   On: 04/30/2017 16:51     Labs:   Basic Metabolic Panel:  Recent Labs Lab 04/30/17 0940 05/01/17 0715 05/03/17 0433  NA 140 140 139  K 4.0 3.6 3.8  CL 106 108 107  CO2 26 23 25   GLUCOSE 137* 95 102*  BUN 11 6 8   CREATININE 0.92 0.70 0.78  CALCIUM 8.9 8.9 9.0   GFR Estimated Creatinine Clearance: 48.7 mL/min (by C-G formula based on SCr of 0.78 mg/dL). Liver Function Tests:  Recent Labs Lab 04/30/17 0940 05/01/17 0715  AST 24 23  ALT 18 19  ALKPHOS 58 63  BILITOT 0.4 0.5  PROT 5.9* 5.9*  ALBUMIN 3.3* 3.5   No results for input(s): LIPASE, AMYLASE in the last 168 hours. No results for input(s): AMMONIA in the last 168 hours. Coagulation profile No results for input(s): INR, PROTIME in the last 168 hours.  CBC:  Recent Labs Lab 04/30/17 0940 05/01/17 0715 05/03/17 0433  WBC 7.4 7.0 8.0  NEUTROABS 6.3  --   --   HGB 11.4* 12.1 11.5*  HCT 35.7* 38.5 36.3  MCV 85.2 85.9 85.0  PLT 260 249 261   Cardiac Enzymes: No results for input(s): CKTOTAL, CKMB, CKMBINDEX, TROPONINI in the last 168 hours. BNP: Invalid input(s): POCBNP CBG:  Recent Labs Lab 05/02/17 0751  GLUCAP 87   D-Dimer No results for input(s): DDIMER in the last 72 hours. Hgb A1c No results for input(s): HGBA1C in the last 72 hours. Lipid Profile No results for input(s): CHOL, HDL, LDLCALC, TRIG, CHOLHDL, LDLDIRECT in the last 72 hours. Thyroid function studies No  results for input(s): TSH, T4TOTAL, T3FREE, THYROIDAB in the last 72 hours.  Invalid input(s): FREET3 Anemia work up No results for input(s): VITAMINB12, FOLATE, FERRITIN, TIBC, IRON, RETICCTPCT in the last 72 hours. Microbiology No results found for this or any previous visit (from the past 240 hour(s)).   Discharge Instructions:   Discharge Instructions    Ambulatory referral to Neurology    Complete by:  As directed    An appointment is requested in approximately:2 weeks   Diet general    Complete by:  As directed    Increase activity slowly    Complete by:  As directed      Allergies as of 05/04/2017      Reactions   Norvasc [amlodipine] Swelling   Benicar [olmesartan] Other (See Comments)   Nervousness   Lisinopril Cough      Medication List    STOP taking these medications   ibuprofen 200 MG tablet Commonly known as:  ADVIL,MOTRIN   terazosin 1 MG capsule Commonly known as:  HYTRIN     TAKE these medications   bisacodyl 10 MG suppository Commonly known as:  DULCOLAX Place 1 suppository (10 mg total) rectally daily as needed for moderate constipation.   buPROPion 150 MG 24 hr tablet Commonly known as:  WELLBUTRIN XL Take 150 mg by mouth daily.   meclizine 12.5 MG tablet Commonly known as:  ANTIVERT Take 1 tablet (12.5 mg total) by mouth 2 (two) times daily as needed for dizziness.   nortriptyline 75 MG capsule Commonly known as:  PAMELOR Take 150 mg by mouth at bedtime.   polyethylene glycol packet Commonly known as:  MIRALAX / GLYCOLAX Take 17 g by mouth daily.      Follow-up Information    Elizabeth Rua, MD Follow up in 1 week(s).   Specialty:  Family Medicine Contact information: [address] [place] [address] [phone]        Levert Feinstein, MD Follow up in 2 week(s).   Specialty:  Neurology Contact information: [address] [place] [address] [phone]            Time coordinating discharge:  35 min  Signed:  Hilary Milks U Tulsi Crossett   [facility] 05/04/2017, 10:59 AM

## 2017-05-04 NOTE — Care Management Important Message (Signed)
Important Message  Patient Details  Name: Elizabeth [name] MRN: [id number] Date of Birth: 1941-11-03   Medicare Important Message Given:  Yes    Elizabeth [name] 05/04/2017, 10:30 AM

## 2017-05-04 NOTE — Progress Notes (Signed)
Patient will DC to: [facility] Anticipated DC date: 05/04/17 Family notified: [facility] by: Sharin MonsPTAR 3:15pm   Per MD [facility]. RN, patient, patient's family, and facility notified of DC. Discharge Summary sent to facility. RN given number for report [phone]). DC packet on chart. Ambulance transport requested for patient.   CSW signing off.  Cristobal GoldmannNadia Etter Royall, [place] Clinical Social Worker [phone]

## 2017-05-04 NOTE — Care Management Note (Signed)
Case Management Note  Patient Details  Name: Elizabeth [name] MRN: [id number] Date of Birth: 09/29/1941  Subjective/Objective:     Orthostatic hypotension, constipation,  AMS              Action/Plan: Discharge Planning: Chart reviewed. Scheduled dc to SNF today. CSW following for SNF.   PCP Joycelyn RuaMEYERS, STEPHEN MD   Expected Discharge Date:                 Expected Discharge Plan:  Skilled Nursing Facility  In-House Referral:  Clinical Social Work  Discharge planning Services  CM Consult  Post Acute Care Choice:  NA Choice offered to:  NA  DME Arranged:  N/A DME Agency:  NA  HH Arranged:  NA HH Agency:  NA  Status of Service:  Completed, signed off  If discussed at Long Length of Stay Meetings, dates discussed:    Additional Comments:  Elliot CousinShavis, Sione Baumgarten Ellen, RN 05/04/2017, 9:08 AM

## 2017-05-05 DIAGNOSIS — F0390 Unspecified dementia without behavioral disturbance: Secondary | ICD-10-CM | POA: Diagnosis not present

## 2017-05-09 ENCOUNTER — Telehealth: Payer: Self-pay | Admitting: Neurology

## 2017-05-09 NOTE — Telephone Encounter (Signed)
[facility]  Patient needs to be seen for worsening memory.  Per Dr. Terrace ArabiaYan, please schedule with NP.  She has been placed on Megan's schedule on 05/11/17.

## 2017-05-09 NOTE — Telephone Encounter (Signed)
[facility] [phone] called to schedule follow up appt from ED discharge summary on 05/04/17. Dr Terrace ArabiaYan does not have any availability prior to pt's existing appt on 7/18. Please call

## 2017-05-11 ENCOUNTER — Other Ambulatory Visit: Payer: Self-pay

## 2017-05-11 ENCOUNTER — Emergency Department (HOSPITAL_COMMUNITY): Payer: Medicare Other

## 2017-05-11 ENCOUNTER — Encounter: Payer: Self-pay | Admitting: Adult Health

## 2017-05-11 ENCOUNTER — Inpatient Hospital Stay (HOSPITAL_COMMUNITY): Payer: Medicare Other

## 2017-05-11 ENCOUNTER — Encounter (HOSPITAL_COMMUNITY): Payer: Self-pay | Admitting: Internal Medicine

## 2017-05-11 ENCOUNTER — Ambulatory Visit (INDEPENDENT_AMBULATORY_CARE_PROVIDER_SITE_OTHER): Payer: Medicare Other | Admitting: Adult Health

## 2017-05-11 ENCOUNTER — Inpatient Hospital Stay (HOSPITAL_COMMUNITY)
Admission: EM | Admit: 2017-05-11 | Discharge: 2017-05-14 | DRG: 072 | Disposition: A | Discharge status: Skilled Nursing Facility | Payer: Medicare Other | Attending: Internal Medicine | Admitting: Internal Medicine

## 2017-05-11 VITALS — BP 130/72 | HR 80 | Ht 63.0 in

## 2017-05-11 DIAGNOSIS — E785 Hyperlipidemia, unspecified: Secondary | ICD-10-CM | POA: Diagnosis present

## 2017-05-11 DIAGNOSIS — R278 Other lack of coordination: Secondary | ICD-10-CM | POA: Diagnosis not present

## 2017-05-11 DIAGNOSIS — F419 Anxiety disorder, unspecified: Secondary | ICD-10-CM | POA: Diagnosis present

## 2017-05-11 DIAGNOSIS — M25551 Pain in right hip: Secondary | ICD-10-CM | POA: Diagnosis not present

## 2017-05-11 DIAGNOSIS — E559 Vitamin D deficiency, unspecified: Secondary | ICD-10-CM | POA: Diagnosis present

## 2017-05-11 DIAGNOSIS — F0280 Dementia in other diseases classified elsewhere without behavioral disturbance: Secondary | ICD-10-CM | POA: Diagnosis present

## 2017-05-11 DIAGNOSIS — R488 Other symbolic dysfunctions: Secondary | ICD-10-CM | POA: Diagnosis not present

## 2017-05-11 DIAGNOSIS — R41841 Cognitive communication deficit: Secondary | ICD-10-CM | POA: Diagnosis not present

## 2017-05-11 DIAGNOSIS — R2689 Other abnormalities of gait and mobility: Secondary | ICD-10-CM | POA: Diagnosis not present

## 2017-05-11 DIAGNOSIS — Z888 Allergy status to other drugs, medicaments and biological substances status: Secondary | ICD-10-CM

## 2017-05-11 DIAGNOSIS — R471 Dysarthria and anarthria: Secondary | ICD-10-CM

## 2017-05-11 DIAGNOSIS — I10 Essential (primary) hypertension: Secondary | ICD-10-CM | POA: Diagnosis present

## 2017-05-11 DIAGNOSIS — M1611 Unilateral primary osteoarthritis, right hip: Secondary | ICD-10-CM | POA: Diagnosis not present

## 2017-05-11 DIAGNOSIS — Z90710 Acquired absence of both cervix and uterus: Secondary | ICD-10-CM | POA: Diagnosis not present

## 2017-05-11 DIAGNOSIS — K589 Irritable bowel syndrome without diarrhea: Secondary | ICD-10-CM | POA: Diagnosis present

## 2017-05-11 DIAGNOSIS — R2681 Unsteadiness on feet: Secondary | ICD-10-CM | POA: Diagnosis not present

## 2017-05-11 DIAGNOSIS — G309 Alzheimer's disease, unspecified: Secondary | ICD-10-CM | POA: Diagnosis present

## 2017-05-11 DIAGNOSIS — F0390 Unspecified dementia without behavioral disturbance: Secondary | ICD-10-CM | POA: Diagnosis not present

## 2017-05-11 DIAGNOSIS — E039 Hypothyroidism, unspecified: Secondary | ICD-10-CM | POA: Diagnosis not present

## 2017-05-11 DIAGNOSIS — Z9841 Cataract extraction status, right eye: Secondary | ICD-10-CM | POA: Diagnosis not present

## 2017-05-11 DIAGNOSIS — R410 Disorientation, unspecified: Secondary | ICD-10-CM

## 2017-05-11 DIAGNOSIS — F329 Major depressive disorder, single episode, unspecified: Secondary | ICD-10-CM | POA: Diagnosis present

## 2017-05-11 DIAGNOSIS — R531 Weakness: Secondary | ICD-10-CM

## 2017-05-11 DIAGNOSIS — I6789 Other cerebrovascular disease: Secondary | ICD-10-CM | POA: Diagnosis not present

## 2017-05-11 DIAGNOSIS — R270 Ataxia, unspecified: Secondary | ICD-10-CM | POA: Diagnosis not present

## 2017-05-11 DIAGNOSIS — Z9842 Cataract extraction status, left eye: Secondary | ICD-10-CM

## 2017-05-11 DIAGNOSIS — Z66 Do not resuscitate: Secondary | ICD-10-CM | POA: Diagnosis present

## 2017-05-11 DIAGNOSIS — D649 Anemia, unspecified: Secondary | ICD-10-CM | POA: Diagnosis not present

## 2017-05-11 DIAGNOSIS — M6281 Muscle weakness (generalized): Secondary | ICD-10-CM | POA: Diagnosis not present

## 2017-05-11 DIAGNOSIS — N390 Urinary tract infection, site not specified: Secondary | ICD-10-CM | POA: Diagnosis not present

## 2017-05-11 DIAGNOSIS — R4182 Altered mental status, unspecified: Secondary | ICD-10-CM | POA: Diagnosis not present

## 2017-05-11 DIAGNOSIS — R4781 Slurred speech: Secondary | ICD-10-CM | POA: Diagnosis not present

## 2017-05-11 DIAGNOSIS — Q7102 Congenital complete absence of left upper limb: Secondary | ICD-10-CM

## 2017-05-11 DIAGNOSIS — Z803 Family history of malignant neoplasm of breast: Secondary | ICD-10-CM

## 2017-05-11 DIAGNOSIS — K219 Gastro-esophageal reflux disease without esophagitis: Secondary | ICD-10-CM | POA: Diagnosis present

## 2017-05-11 DIAGNOSIS — I951 Orthostatic hypotension: Secondary | ICD-10-CM | POA: Diagnosis not present

## 2017-05-11 DIAGNOSIS — G9340 Encephalopathy, unspecified: Secondary | ICD-10-CM | POA: Diagnosis not present

## 2017-05-11 DIAGNOSIS — R296 Repeated falls: Secondary | ICD-10-CM | POA: Diagnosis not present

## 2017-05-11 DIAGNOSIS — Z9181 History of falling: Secondary | ICD-10-CM | POA: Diagnosis not present

## 2017-05-11 LAB — URINALYSIS, ROUTINE W REFLEX MICROSCOPIC
Bilirubin Urine: NEGATIVE
Glucose, UA: NEGATIVE mg/dL
Hgb urine dipstick: NEGATIVE
Ketones, ur: NEGATIVE mg/dL
Nitrite: NEGATIVE
Protein, ur: NEGATIVE mg/dL
Specific Gravity, Urine: 1.013 (ref 1.005–1.030)
pH: 7 (ref 5.0–8.0)

## 2017-05-11 LAB — CBC WITH DIFFERENTIAL/PLATELET
Basophils Absolute: 0 10*3/uL (ref 0.0–0.1)
Basophils Relative: 0 %
Eosinophils Absolute: 0.3 10*3/uL (ref 0.0–0.7)
Eosinophils Relative: 4 %
HCT: 39.6 % (ref 36.0–46.0)
Hemoglobin: 12.7 g/dL (ref 12.0–15.0)
Lymphocytes Relative: 21 %
Lymphs Abs: 1.6 10*3/uL (ref 0.7–4.0)
MCH: 27.7 pg (ref 26.0–34.0)
MCHC: 32.1 g/dL (ref 30.0–36.0)
MCV: 86.3 fL (ref 78.0–100.0)
Monocytes Absolute: 0.6 10*3/uL (ref 0.1–1.0)
Monocytes Relative: 8 %
Neutro Abs: 5 10*3/uL (ref 1.7–7.7)
Neutrophils Relative %: 67 %
Platelets: 308 10*3/uL (ref 150–400)
RBC: 4.59 MIL/uL (ref 3.87–5.11)
RDW: 15.5 % (ref 11.5–15.5)
WBC: 7.5 10*3/uL (ref 4.0–10.5)

## 2017-05-11 LAB — BASIC METABOLIC PANEL
Anion gap: 7 (ref 5–15)
BUN: 12 mg/dL (ref 6–20)
CO2: 30 mmol/L (ref 22–32)
Calcium: 9.9 mg/dL (ref 8.9–10.3)
Chloride: 102 mmol/L (ref 101–111)
Creatinine, Ser: 0.97 mg/dL (ref 0.44–1.00)
GFR calc Af Amer: >60 mL/min (ref >60)
GFR calc non Af Amer: 55 mL/min — ABNORMAL LOW (ref >60)
Glucose, Bld: 89 mg/dL (ref 65–99)
Potassium: 3.8 mmol/L (ref 3.5–5.1)
Sodium: 139 mmol/L (ref 135–145)

## 2017-05-11 MED ORDER — LORAZEPAM 2 MG/ML IJ SOLN
1.0000 mg | Freq: Once | INTRAMUSCULAR | Status: AC
  Administered 2017-05-11: 1 mg via INTRAVENOUS
  Filled 2017-05-11: qty 1

## 2017-05-11 MED ORDER — LIDOCAINE HCL (PF) 1 % IJ SOLN
INTRAMUSCULAR | Status: AC
  Administered 2017-05-11: 5 mL via INTRADERMAL
  Filled 2017-05-11: qty 5
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
## 2017-05-11 NOTE — Consult Note (Signed)
Neurology Consultation Reason for Consult: Altered mental status Referring Physician: Basil Dess  CC: Altered mental status  History is obtained from: Daughter  HPI: KELBI RENSTROM is a 76 y.o. female with a history of progressive decline over the past few weeks. She presented to Hospital on May 8 for fall, but was not having the severe confusion. In fact, she produced pitted in her bowling league on May 15. The next day she became progressively weaker began falling more frequently. She was found to be orthostatic and was admitted from 5/20 until 5/24. She was evaluated in outpatient neurology today, and they found her to be wheelchair bound and quite dysarthric. She was unable to walk. Given this rapid decline she was transferred to [facility] for further evaluation.  She was taken for MRI, however was unable to lay still and therefore was given sedation(total 2 mg IV Ativan) insist that time the patient has become very sedated and is unable to participate at all and history taking.  An LP was attempted in the ED but was unsuccessful.   LKW: Unclear, but at least a few weeks tpa given?: no, outside of window    ROS:  Unable to obtain due to altered mental status.   Past Medical History:  Diagnosis Date  . Allergic rhinitis   . Anxiety   . Congenital defect    left arm  . Depression   . GERD (gastroesophageal reflux disease)   . Headache   . Hyperlipemia   . Hypertension   . IBS (irritable bowel syndrome)   . Insomnia   . Memory loss   . Vitamin D deficiency      Family History  Problem Relation Age of Onset  . Adopted: Yes  . Breast cancer Mother   . Other Father        unsure of history     Social History:  reports that she has never smoked. She has never used smokeless tobacco. She reports that she does not drink alcohol or use drugs.   Exam: Current vital signs: BP (!) 154/97   Pulse 89   Temp 99 F (37.2 C) (Oral)   Resp (!) 24   Ht 5\' 3"  (1.6 m)    SpO2 100%  Vital signs in last 24 hours: Temp:  [99 F (37.2 C)] 99 F (37.2 C) (05/31 1612) Pulse Rate:  [80-103] 89 (05/31 2015) Resp:  [20-28] 24 (05/31 2015) BP: (118-155)/(60-98) 154/97 (05/31 2015) SpO2:  [96 %-100 %] 100 % (05/31 2015)   Physical Exam  Constitutional: Appears Elderly Psych: Affect appropriate to situation Eyes: No scleral injection HENT: No OP obstrucion Head: Normocephalic.  Cardiovascular: Normal rate and regular rhythm.  Respiratory: Effort normal and breath sounds normal to anterior ascultation GI: Soft.  No distension. There is no tenderness.  Skin: WDI   Exam limited due to sedation Neuro: Mental Status: Patient is obtunded, she does not answer questions, but does resist any examination. Cranial Nerves: II: She keeps her eyes tightly shut, and when forced open no clear blink to threat is seen. Pupils are equal, round, and reactive to light.   III,IV, VI: She keeps her eyes relatively midline, resists attempts to check doll's eye V: She responds to facial stimulation bilaterally VII: Facial movement is symmetric.  Motor: She moves all extremities spontaneously, in fact she is agitated and restless. Sensory: She response to noxious stimuli in all 4 extremities Deep Tendon Reflexes: Mildly decreased at the knees bilaterally, slightly brisk  in the left bicep, she resists checking the right bicep to the point that I am not confident Cerebellar: She does not perform     I have reviewed labs in epic and the results pertinent to this consultation are: BMP-unremarkable CBC-unremarkable UA - ? nitrite negative UTI  I have reviewed the images obtained: CT head-unremarkable  Impression: 76 year old female with rapidly progressive encephalopathy. The differential in her case is wide, and could be something as simple as delirium in the setting of an underlying dementia, however with her increased falls and subacute course this does raise the  possibility of other etiologies such as nutritional or autoimmune causes.  She had a lumbar puncture attempted in the emergency department, however was unable to obtain it.   Unfortunately, her current exam is rather limited due to sedation.  Recommendations: 1) agree with thiamine lab and will start repletion(100 mg daily) empirically while waiting. 2) lumbar puncture-studies for protein, glucose, cells, IgG index, HSV, lactate 3) MRI brain with and without contrast 4) EEG 5) ammonia   Ritta SlotMcNeill Anahis Furgeson, MD [facility] [phone]  If 7pm- 7am, please page neurology on call as listed in AMION.

## 2017-05-11 NOTE — ED Notes (Signed)
Patient transported to MRI 

## 2017-05-11 NOTE — ED Notes (Signed)
Pt unable to be still at all for MRI.  MRI believes the patient may need anesthesia in order to be still.  MD notified.
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
## 2017-05-11 NOTE — ED Provider Notes (Signed)
[facility]-EMERGENCY DEPT Provider Note   CSN: [id number] Arrival date & time: 05/11/17  1604     History   Chief Complaint Chief Complaint  Patient presents with  . Aphasia  . Altered Mental Status    HPI Elizabeth [name] is a 76 y.o. female.  Patient is a 76 year old elderly female presenting today with altered mental status. Of note at the beginning of this month patient lived independently drove herself to and from places and was very active in the community. Approximately 1-1/2 weeks ago the patient developed multiple falls. Daughter states within a 3-4 days and she had 5 or more falls and altered mental status. She was brought to the emergency room and admitted after she was found to be hypotensive. It was thought that patient's symptoms were related to hypotension, dementia. New blood pressure medication was discontinued and she was discharged to rehabilitation approximately one week ago. She had had an MRI of her brain done at the beginning of May prior to the symptoms which showed chronic microvascular disease but no other acute findings. She did not have repeat imaging when she returned to the emergency room. However since she's been at rehabilitation patient has had ongoing falls. She has to walk with a lap belt because she falls backwards into the right and is unable to correct. She sometimes neglects the left side and has intermittent periods of incomprehensible speech. Per daughter she has been eating okay at the facility but occasionally will get choked. She denies her having any complaints of abdominal pain, back pain or neck pain. She has not had fever or vomiting. They went to the neurologist today for further evaluation if symptoms are not improving and they sent her here for further evaluation.    The history is provided by a relative.  Altered Mental Status      Past Medical History:  Diagnosis Date  . Allergic rhinitis   . Anxiety   . Congenital defect    left arm    . Depression   . GERD (gastroesophageal reflux disease)   . Headache   . Hyperlipemia   . Hypertension   . IBS (irritable bowel syndrome)   . Insomnia   . Memory loss   . Postmenopausal   . Vitamin D deficiency     Patient Active Problem List   Diagnosis Date Noted  . Orthostatic hypotension 05/01/2017  . Generalized weakness 04/30/2017  . Hypertension 04/30/2017  . Depression 04/30/2017  . Right arm pain 04/30/2017  . Anxiety 04/30/2017  . Passed out 02/06/2017  . Memory loss 02/06/2017    Past Surgical History:  Procedure Laterality Date  . ABDOMINAL HYSTERECTOMY    . CATARACT EXTRACTION Bilateral   . NECK SURGERY      OB History    No data available       Home Medications    Prior to Admission medications   Medication Sig Start Date End Date Taking? Authorizing Provider  bisacodyl (DULCOLAX) 10 MG suppository Place 1 suppository (10 mg total) rectally daily as needed for moderate constipation. 05/04/17   Joseph Art, DO  buPROPion (WELLBUTRIN XL) 150 MG 24 hr tablet Take 150 mg by mouth daily. 04/07/17   [provider]  LORazepam (ATIVAN) 0.5 MG tablet Take 0.25 mg by mouth 3 (three) times daily.    [provider]  LORazepam (ATIVAN) 0.5 MG tablet Take 0.25 mg by mouth every 6 (six) hours as needed for anxiety.     [provider]  meclizine (ANTIVERT) 12.5 MG tablet Take 1 tablet (12.5 mg total) by mouth 2 (two) times daily as needed for dizziness. 05/04/17   Joseph Art, DO  nortriptyline (PAMELOR) 75 MG capsule Take 150 mg by mouth at bedtime. 11/07/16   [provider]  polyethylene glycol (MIRALAX / GLYCOLAX) packet Take 17 g by mouth daily. 05/04/17   Joseph Art, DO  UNABLE TO FIND Med Name: Medd Pass 2 oz po TId    [provider]    Family History Family History  Problem Relation Age of Onset  . Breast cancer Mother   . Other Father        unsure of history    Social History Social History   Substance Use Topics  . Smoking status: Never Smoker  . Smokeless tobacco: Never Used  . Alcohol use No     Allergies   Norvasc [amlodipine]; Benicar [olmesartan]; and Lisinopril   Review of Systems Review of Systems   Physical Exam Updated Vital Signs BP (!) 118/98 (BP Location: Left Arm)   Pulse 81   Temp 99 F (37.2 C) (Oral)   Resp 20   Ht 5\' 3"  (1.6 m)   SpO2 96%   Physical Exam  Constitutional: She appears well-developed and well-nourished. No distress.  HENT:  Head: Normocephalic and atraumatic.  Mouth/Throat: Oropharynx is clear and moist.  Eyes: Conjunctivae and EOM are normal. Pupils are equal, round, and reactive to light.  Neck: Normal range of motion. Neck supple.  Cardiovascular: Normal rate, regular rhythm and intact distal pulses.   No murmur heard. Pulmonary/Chest: Effort normal and breath sounds normal. No respiratory distress. She has no wheezes. She has no rales.  Abdominal: Soft. She exhibits no distension. There is no tenderness. There is no rebound and no guarding.  Musculoskeletal: Normal range of motion. She exhibits no edema or tenderness.  Neurological: She is alert. Coordination abnormal.  Pt has cognitive deficit and unable to follow questions.  Oriented to person and place but unable to remember the month or president.  Unclear if visual field deficit or difficult understanding but seems to have left visual field cut.  Slurred speech.  Strength is 5/5 and no pronator drift  Skin: Skin is warm and dry. No rash noted. No erythema.  Psychiatric: She has a normal mood and affect. Her behavior is normal.  Nursing note and vitals reviewed.    ED Treatments / Results  Labs (all labs ordered are listed, but only abnormal results are displayed) Labs Reviewed  BASIC METABOLIC PANEL - Abnormal; Notable for the following:       Result Value   GFR calc non Af Amer 55 (*)    All other components within normal limits  URINALYSIS, ROUTINE W REFLEX  MICROSCOPIC - Abnormal; Notable for the following:    APPearance CLOUDY (*)    Leukocytes, UA LARGE (*)    Bacteria, UA RARE (*)    Squamous Epithelial / LPF 0-5 (*)    All other components within normal limits  CBC WITH DIFFERENTIAL/PLATELET  VITAMIN B1    EKG  EKG Interpretation  Date/Time:  Thursday May 11 2017 16:01:02 EDT Ventricular Rate:  82 PR Interval:  164 QRS Duration: 102 QT Interval:  364 QTC Calculation: 425 R Axis:   65 Text Interpretation:  Normal sinus rhythm Normal ECG No significant change since last tracing Confirmed by Gwyneth Sprout ([id number]) on 05/11/2017 4:23:31 PM       Radiology  Ct Head Wo Contrast  Result Date: 05/11/2017 CLINICAL DATA:  Slurred speech and confusion. EXAM: CT HEAD WITHOUT CONTRAST TECHNIQUE: Contiguous axial images were obtained from the base of the skull through the vertex without intravenous contrast. COMPARISON:  Brain MRI 04/18/2017.  Head CT 04/18/2017. FINDINGS: Brain: There is no evidence for acute hemorrhage, hydrocephalus, mass lesion, or abnormal extra-axial fluid collection. No definite CT evidence for acute infarction. Diffuse loss of parenchymal volume is consistent with atrophy. Patchy low attenuation in the deep hemispheric and periventricular white matter is nonspecific, but likely reflects chronic microvascular ischemic demyelination. Vascular: No hyperdense vessel or unexpected calcification. Skull: Nondisplaced right occipital skull fracture has been present on exams back to 11/21/2016. Sinuses/Orbits: The visualized paranasal sinuses and mastoid air cells are clear. Visualized portions of the globes and intraorbital fat are unremarkable. Other: None. IMPRESSION: 1. Stable exam.  No acute intracranial abnormality. 2. Atrophy with chronic small vessel white matter ischemic disease. 3. Nondisplaced right occipital skull fracture stable since 11/21/2016. Electronically Signed   By: Kennith Center M.D.   On: 05/11/2017 18:01     Procedures .Lumbar Puncture Date/Time: 05/11/2017 8:15 PM Performed by: Gwyneth Sprout Authorized by: Gwyneth Sprout   Consent:    Consent obtained:  Verbal   Consent given by:  Patient   Risks discussed:  Bleeding, headache, infection, pain and repeat procedure   Alternatives discussed:  No treatment Pre-procedure details:    Procedure purpose:  Diagnostic   Preparation: Patient was prepped and draped in usual sterile fashion   Anesthesia (see MAR for exact dosages):    Anesthesia method:  Local infiltration   Local anesthetic:  Lidocaine 1% w/o epi Procedure details:    Lumbar space:  L4-L5 interspace   Patient position:  Sitting   Needle gauge:  20   Needle type:  Diamond point   Needle length (in):  2.5   Ultrasound guidance: no     Number of attempts:  3 Post-procedure:    Puncture site:  Adhesive bandage applied   Patient tolerance of procedure:  Tolerated well, no immediate complications Comments:     Unable to obtain fluid   (including critical care time)  Medications Ordered in ED Medications - No data to display   Initial Impression / Assessment and Plan / ED Course  I have reviewed the triage vital signs and the nursing notes.  Pertinent labs & imaging results that were available during my care of the patient were reviewed by me and considered in my medical decision making (see chart for details).     Patient presenting today with persistent altered mental status, difficulty with coordination and persistent falls. This is a drastic change from 2 weeks ago when patient was independent and driving. Daughter denies any infectious symptoms, she has been continued to be off the blood pressure medication that they thought was causing orthostatic hypotension and possible dementia. However she states every time she has fallen she has hit her head. Patient does not take anticoagulation. She is awake on exam but is not oriented to time. She is able to answer  questions but is unable to follow complex exam requests such as heel-to-shin testing. She also has a hard time doing task as simple as raising her arms without showing her first and then she will repeat. Also possibility for a left visual field cut. Patient does have mild slurred speech today but no notable aphasia. Vital signs are within normal limits and patient is in no acute distress.  Concern for neurologic process at this point based on patient's exam and history. We'll initially do a CT to rule out intracranial hemorrhage given her multiple falls where she has hit her head repeatedly. However if those are normal feel patient needs a repeat MRI. Also we will evaluate for infection with a UA as well as electrolyte abnormalities with CBC, CMP. EKG without acute findings.  7:38 PM Labs without significant findings except for a UA with large leukocytes and 6-30 white blood cells but rare bacteria. This may be chronic colonization. She has a normal white count and a normal BMP. We'll culture the urine. Discussed the case with neurology who recommended LP and MRI. Also recommended admission for further evaluation. Thiamine levels ordered. We'll attempt LP.  8:16 PM Unable to get LP. Patient continually is would sit up and states that she was done. Not terribly cooperative. Patient will need LP under IR however labs are looking for is not necessarily for infection. Feel that it can wait till tomorrow. MRI she will achieve tonight. Will admit patient for further care.  Final Clinical Impressions(s) / ED Diagnoses   Final diagnoses:  Altered mental status, unspecified altered mental status type  Ataxia    New Prescriptions New Prescriptions   No medications on file     Gwyneth SproutPlunkett, Jonetta Dagley, MD 05/11/17 2017

## 2017-05-11 NOTE — ED Triage Notes (Signed)
[facility]. Fm neuro office for follow up from fall. Not recent fall. Family states patient had slurred speech and LSW time was Friday. Patient confused, alert and oriented to person/place. ekg ems SR. No blood thinners. Per EMS patient appears to be over medicated with psych meds. Neuro intact. Left hand amputation. 20 RH. 102 cbg, 131/85, 95%, 80 HR, 16 RR. Denies any pain.

## 2017-05-11 NOTE — Patient Instructions (Signed)
MRI brain ordered If your symptoms worsen or you develop new symptoms please let us know.

## 2017-05-11 NOTE — ED Notes (Signed)
MRI called. Patient became alert and agitated during movement to MRI table, even with pt's daughter helping reorient patient.  EDP reluctant to give more ativan.  Pt on way back from MRI right now.

## 2017-05-12 ENCOUNTER — Inpatient Hospital Stay (HOSPITAL_COMMUNITY): Payer: Medicare Other

## 2017-05-12 LAB — RAPID URINE DRUG SCREEN, HOSP PERFORMED
Amphetamines: NOT DETECTED
Barbiturates: NOT DETECTED
Benzodiazepines: POSITIVE — AB
Cocaine: NOT DETECTED
Opiates: NOT DETECTED
Tetrahydrocannabinol: NOT DETECTED

## 2017-05-12 LAB — BASIC METABOLIC PANEL
Anion gap: 9 (ref 5–15)
BUN: 11 mg/dL (ref 6–20)
CO2: 27 mmol/L (ref 22–32)
Calcium: 9.7 mg/dL (ref 8.9–10.3)
Chloride: 102 mmol/L (ref 101–111)
Creatinine, Ser: 0.89 mg/dL (ref 0.44–1.00)
GFR calc Af Amer: >60 mL/min (ref >60)
GFR calc non Af Amer: >60 mL/min (ref >60)
Glucose, Bld: 100 mg/dL — ABNORMAL HIGH (ref 65–99)
Potassium: 4.2 mmol/L (ref 3.5–5.1)
Sodium: 138 mmol/L (ref 135–145)

## 2017-05-12 LAB — CRYPTOCOCCAL ANTIGEN, CSF: Crypto Ag: NEGATIVE

## 2017-05-12 LAB — CBC
HCT: 39.5 % (ref 36.0–46.0)
Hemoglobin: 12.5 g/dL (ref 12.0–15.0)
MCH: 27.2 pg (ref 26.0–34.0)
MCHC: 31.6 g/dL (ref 30.0–36.0)
MCV: 86.1 fL (ref 78.0–100.0)
Platelets: 285 10*3/uL (ref 150–400)
RBC: 4.59 MIL/uL (ref 3.87–5.11)
RDW: 15.3 % (ref 11.5–15.5)
WBC: 8.6 10*3/uL (ref 4.0–10.5)

## 2017-05-12 LAB — LACTATE DEHYDROGENASE, PLEURAL OR PERITONEAL FLUID: LD, Fluid: 40 U/L — ABNORMAL HIGH (ref 3–23)

## 2017-05-12 LAB — AMMONIA: Ammonia: 16 umol/L (ref 9–35)

## 2017-05-12 LAB — CSF CELL COUNT WITH DIFFERENTIAL
RBC Count, CSF: 32000 /mm3 — ABNORMAL HIGH
Tube #: 3
WBC, CSF: 8 /mm3 — ABNORMAL HIGH (ref 0–5)

## 2017-05-12 LAB — GLUCOSE, CSF: Glucose, CSF: 51 mg/dL (ref 40–70)

## 2017-05-12 LAB — PROTEIN, CSF: Total  Protein, CSF: 274 mg/dL — ABNORMAL HIGH (ref 15–45)

## 2017-05-12 MED ORDER — LORAZEPAM 2 MG/ML IJ SOLN: 1.0000 mg | Freq: Four times a day (QID) | INTRAMUSCULAR | Status: DC | PRN

## 2017-05-12 MED ORDER — LORAZEPAM 0.5 MG PO TABS: 0.2500 mg | ORAL_TABLET | Freq: Four times a day (QID) | ORAL | Status: DC | PRN

## 2017-05-12 MED ORDER — ACETAMINOPHEN 650 MG RE SUPP: 650.0000 mg | Freq: Four times a day (QID) | RECTAL | Status: DC | PRN

## 2017-05-12 MED ORDER — LORAZEPAM 1 MG PO TABS: 1.0000 mg | ORAL_TABLET | Freq: Four times a day (QID) | ORAL | Status: DC | PRN

## 2017-05-12 MED ORDER — LIDOCAINE HCL (PF) 1 % IJ SOLN
10.0000 mL | Freq: Once | INTRAMUSCULAR | Status: AC
  Administered 2017-05-12: 7 mL via INTRADERMAL
  Filled 2017-05-12: qty 10

## 2017-05-12 MED ORDER — LORAZEPAM 2 MG/ML IJ SOLN
1.0000 mg | Freq: Once | INTRAMUSCULAR | Status: AC
  Administered 2017-05-12: 1 mg via INTRAVENOUS
  Filled 2017-05-12: qty 1

## 2017-05-12 MED ORDER — ORAL CARE MOUTH RINSE
15.0000 mL | Freq: Two times a day (BID) | OROMUCOSAL | Status: DC
  Administered 2017-05-13 – 2017-05-14 (×3): 15 mL via OROMUCOSAL

## 2017-05-12 MED ORDER — METOPROLOL TARTRATE 25 MG PO TABS
25.0000 mg | ORAL_TABLET | Freq: Two times a day (BID) | ORAL | Status: DC
  Administered 2017-05-12 – 2017-05-14 (×4): 25 mg via ORAL
  Filled 2017-05-12 (×5): qty 1

## 2017-05-12 MED ORDER — DEXTROSE 5 % IV SOLN
1.0000 g | INTRAVENOUS | Status: DC
  Administered 2017-05-12 – 2017-05-13 (×2): 1 g via INTRAVENOUS
  Filled 2017-05-12 (×3): qty 10

## 2017-05-12 MED ORDER — ONDANSETRON HCL 4 MG PO TABS: 4.0000 mg | ORAL_TABLET | Freq: Four times a day (QID) | ORAL | Status: DC | PRN

## 2017-05-12 MED ORDER — METOPROLOL TARTRATE 25 MG PO TABS
25.0000 mg | ORAL_TABLET | Freq: Once | ORAL | Status: AC
  Administered 2017-05-12: 25 mg via ORAL
  Filled 2017-05-12: qty 1

## 2017-05-12 MED ORDER — LIDOCAINE HCL 1 % IJ SOLN
INTRAMUSCULAR | Status: AC
  Filled 2017-05-12: qty 10

## 2017-05-12 MED ORDER — ONDANSETRON HCL 4 MG/2ML IJ SOLN: 4.0000 mg | Freq: Four times a day (QID) | INTRAMUSCULAR | Status: DC | PRN

## 2017-05-12 MED ORDER — SODIUM CHLORIDE 0.9 % IV SOLN
INTRAVENOUS | Status: DC
  Administered 2017-05-12 (×2): via INTRAVENOUS

## 2017-05-12 MED ORDER — THIAMINE HCL 100 MG/ML IJ SOLN
100.0000 mg | Freq: Every day | INTRAMUSCULAR | Status: DC
  Administered 2017-05-12 – 2017-05-13 (×2): 100 mg via INTRAVENOUS
  Filled 2017-05-12 (×2): qty 2

## 2017-05-12 MED ORDER — CHLORHEXIDINE GLUCONATE 0.12 % MT SOLN
15.0000 mL | Freq: Two times a day (BID) | OROMUCOSAL | Status: DC
Start: 2017-05-12 — End: 2017-05-14
  Administered 2017-05-12 – 2017-05-14 (×4): 15 mL via OROMUCOSAL
  Filled 2017-05-12 (×4): qty 15

## 2017-05-12 MED ORDER — ACETAMINOPHEN 325 MG PO TABS: 650.0000 mg | ORAL_TABLET | Freq: Four times a day (QID) | ORAL | Status: DC | PRN

## 2017-05-12 MED ORDER — BISACODYL 10 MG RE SUPP: 10.0000 mg | Freq: Every day | RECTAL | Status: DC | PRN

## 2017-05-12 MED ORDER — LIDOCAINE HCL (PF) 1 % IJ SOLN
10.0000 mL | Freq: Once | INTRAMUSCULAR | Status: AC
  Administered 2017-05-11: 5 mL via INTRADERMAL
  Filled 2017-05-12: qty 10

## 2017-05-12 MED ORDER — LORAZEPAM 0.5 MG PO TABS
0.2500 mg | ORAL_TABLET | ORAL | Status: DC
  Administered 2017-05-12: 0.25 mg via ORAL
  Filled 2017-05-12: qty 1

## 2017-05-12 MED ORDER — DEXTROSE 5 % IV SOLN
1.0000 g | INTRAVENOUS | Status: DC
  Filled 2017-05-12: qty 10

## 2017-05-12 MED ORDER — POLYETHYLENE GLYCOL 3350 17 G PO PACK
17.0000 g | PACK | Freq: Every day | ORAL | Status: DC
  Administered 2017-05-12 – 2017-05-14 (×3): 17 g via ORAL
  Filled 2017-05-12 (×3): qty 1

## 2017-05-12 MED ORDER — ENOXAPARIN SODIUM 40 MG/0.4ML ~~LOC~~ SOLN: 40.0000 mg | SUBCUTANEOUS | Status: DC

## 2017-05-12 MED ORDER — HALOPERIDOL LACTATE 5 MG/ML IJ SOLN
1.0000 mg | Freq: Once | INTRAMUSCULAR | Status: AC | PRN
  Administered 2017-05-12: 1 mg via INTRAVENOUS
  Filled 2017-05-12: qty 1

## 2017-05-12 NOTE — NC FL2 (Signed)
[place] MEDICAID FL2 LEVEL OF CARE SCREENING TOOL     IDENTIFICATION  Patient Name: DEZZIE BADILLA Birthdate: Oct 29, 1941 Sex: female Admission Date (Current Location): 05/11/2017  [facility] and [place] Number:  Producer, television/film/video and Address:  [facility] [place], [address], [place], [place] [address]      Provider Number: [id number]  Attending Physician Name and Address:  Elease Etienne, MD  Relative Name and Phone Number:  Victorino Dike, daughter, [phone]    Current Level of Care: Hospital Recommended Level of Care: Skilled Nursing Facility Prior Approval Number:    Date Approved/Denied:   PASRR Number: [id number]  Discharge Plan: SNF    Current Diagnoses: Patient Active Problem List   Diagnosis Date Noted  . Encephalopathy 05/11/2017  . Orthostatic hypotension 05/01/2017  . Generalized weakness 04/30/2017  . Hypertension 04/30/2017  . Depression 04/30/2017  . Right arm pain 04/30/2017  . Anxiety 04/30/2017  . Passed out 02/06/2017  . Memory loss 02/06/2017    Orientation RESPIRATION BLADDER Height & Weight      (Disorieted x4)  Normal Continent Weight: 57.7 kg (127 lb 4.8 oz) Height:  5\' 3"  (160 cm)  BEHAVIORAL SYMPTOMS/MOOD NEUROLOGICAL BOWEL NUTRITION STATUS      Continent Diet (Please see DC Summary)  AMBULATORY STATUS COMMUNICATION OF NEEDS Skin   Extensive Assist Verbally Normal                       Personal Care Assistance Level of Assistance  Bathing, Feeding, Dressing Bathing Assistance: Maximum assistance Feeding assistance: Limited assistance Dressing Assistance: Limited assistance     Functional Limitations Info             SPECIAL CARE FACTORS FREQUENCY  PT (By licensed PT)     PT Frequency: 5x/week              Contractures      Additional Factors Info  Code Status, Allergies, Psychotropic Code Status Info: DNR Allergies Info: Norvasc Amlodipine, Benicar Olmesartan,  Lisinopril Psychotropic Info: Ativan         Current Medications (05/12/2017):  This is the current hospital active medication list Current Facility-Administered Medications  Medication Dose Route Frequency Provider Last Rate Last Dose  . 0.9 %  sodium chloride infusion   Intravenous Continuous Jonah Blue, MD 50 mL/hr at 05/12/17 0057    . acetaminophen (TYLENOL) tablet 650 mg  650 mg Oral Q6H PRN Jonah Blue, MD       Or  . acetaminophen (TYLENOL) suppository 650 mg  650 mg Rectal Q6H PRN Jonah Blue, MD      . bisacodyl (DULCOLAX) suppository 10 mg  10 mg Rectal Daily PRN Jonah Blue, MD      . lidocaine (XYLOCAINE) 1 % (with pres) injection           . LORazepam (ATIVAN) tablet 0.25 mg  0.25 mg Oral 3 times per day Jonah Blue, MD   0.25 mg at 05/12/17 1306  . LORazepam (ATIVAN) tablet 0.25 mg  0.25 mg Oral Q6H PRN Jonah Blue, MD      . metoprolol tartrate (LOPRESSOR) tablet 25 mg  25 mg Oral BID Jonah Blue, MD   25 mg at 05/12/17 0057  . ondansetron (ZOFRAN) tablet 4 mg  4 mg Oral Q6H PRN Jonah Blue, MD       Or  . ondansetron [facility]) injection 4 mg  4 mg Intravenous Q6H PRN Jonah Blue, MD      .  polyethylene glycol (MIRALAX / GLYCOLAX) packet 17 g  17 g Oral Daily Jonah BlueYates, Jennifer, MD      . thiamine (B-1) injection 100 mg  100 mg Intravenous Daily Rejeana BrockKirkpatrick, McNeill P, MD   100 mg at 05/12/17 0350     Discharge Medications: Please see discharge summary for a list of discharge medications.  Relevant Imaging Results:  Relevant Lab Results:   Additional Information SSN: [id number]  Lillybeth Tal S ThiellsRayyan, [place]

## 2017-05-12 NOTE — Progress Notes (Addendum)
Patient was unable to complete MRI even after additional dose of haldol was given per previous nurse. According to previous RN, MRI team was able to capture 2 images.  Notified on call NP; NP will defer decision for anymore sedation to daytime team.  Will continue to monitor patient.

## 2017-05-12 NOTE — Procedures (Signed)
ELECTROENCEPHALOGRAM REPORT   Patient: Elizabeth [name]       Room #: 1O105W28 EEG No. ID: [id number] Age: 76 y.o.        Sex: female Referring Physician: Hongalgi Report Date:  05/12/2017        Interpreting Physician: Thana FarrEYNOLDS, Shawana Knoch  History: Elizabeth Radmily R Macnair is an 76 y.o. female with altered mental status  Medications:  Lopressor, Dulcolax, Miralax, Wellbutrin, Antivert, Pamelor  Conditions of Recording:  This is a 16 channel EEG carried out with the patient in the awake and drowsy states.  Description:  The waking background activity consists of a low voltage, symmetrical, fairly well organized, 7 Hz theta activity, seen from the parieto-occipital and posterior temporal regions.  Low voltage fast activity, poorly organized, is seen anteriorly and is at times superimposed on more posterior regions.  A mixture of theta and alpha rhythms are seen from the central and temporal regions. The patient drowses with slowing to irregular, low voltage theta and beta activity.   Stage II sleep is not obtained. No epileptiform activity is noted.   Hyperventilation and intermittent photic stimulation were not performed.   IMPRESSION: This is an abnormal EEG secondary to posterior background slowing.  This finding may be seen with a diffuse gray matter disturbance that is etiologically nonspecific, but may include a dementia, among other possibilities.  No epileptiform activity is noted.     Thana FarrLeslie Magdiel Bartles, MD Neurology [phone] 05/12/2017, 4:39 PM

## 2017-05-12 NOTE — Plan of Care (Signed)
Problem: Education: Goal: Knowledge of [facility] Education information/materials will improve Outcome: Not Progressing New admit  Problem: Safety: Goal: Ability to remain free from injury will improve Outcome: Progressing Safety precautions maintained, daughter at bedside  Problem: Health Behavior/Discharge Planning: Goal: Ability to manage health-related needs will improve Outcome: Not Progressing New admit with alteration in mental status  Problem: Pain Managment: Goal: General experience of comfort will improve Outcome: Not Progressing Unable to assess, patient has alteration in mental status  Problem: Physical Regulation: Goal: Ability to maintain clinical measurements within normal limits will improve Outcome: Not Progressing New patient Goal: Will remain free from infection Outcome: Progressing No s/s of infection  Problem: Skin Integrity: Goal: Risk for impaired skin integrity will decrease Outcome: Progressing Few old ecchymoses to right leg, hip and thigh  Problem: Tissue Perfusion: Goal: Risk factors for ineffective tissue perfusion will decrease Outcome: Progressing SCDs have been ordered  Problem: Activity: Goal: Risk for activity intolerance will decrease Outcome: Not Progressing On bedrest  Problem: Fluid Volume: Goal: Ability to maintain a balanced intake and output will improve Outcome: Progressing IVF 1/2 Normal Saline infusing at 50 ml an hour. Patient is incontinent of bladder and was changed once  Problem: Bowel/Gastric: Goal: Will not experience complications related to bowel motility Outcome: Progressing No gastric or bowel issues reported, daughter reported that patient had a BM 05/11/2017

## 2017-05-12 NOTE — Procedures (Signed)
CLINICAL DATA: [Mental status changes. Encephalopathy.] EXAM: DIAGNOSTIC LUMBAR PUNCTURE UNDER FLUOROSCOPIC GUIDANCE FLUOROSCOPY TIME: Fluoroscopy Time: [4 minutes and 50 seconds] Radiation Exposure Index (if provided by the fluoroscopic device): [Not applicable. ] Number of Acquired Spot Images: [1] PROCEDURE: Informed consent was obtained from the patient prior to the procedure, including potential complications of headache, allergy, and pain. With the patient prone, the lower back was prepped with Betadine. 1% Lidocaine was used for local anesthesia. Lumbar puncture was performed at the [L3-4] level using a [20] gauge needle with return of [pink] CSF. Opening pressure could not be measured secondary to patient immobility and relatively low qualitative pressure. [8] ml of CSF were obtained for laboratory studies. The patient tolerated the procedure well and there were no apparent complications.  IMPRESSION: [Non complicated lumbar puncture as detailed above.]

## 2017-05-12 NOTE — Progress Notes (Signed)
Routine EEG completed, results pending. 
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
## 2017-05-13 ENCOUNTER — Ambulatory Visit (HOSPITAL_COMMUNITY): Payer: Medicare Other

## 2017-05-13 DIAGNOSIS — N390 Urinary tract infection, site not specified: Secondary | ICD-10-CM

## 2017-05-13 LAB — HERPES SIMPLEX VIRUS(HSV) DNA BY PCR
HSV 1 DNA: NEGATIVE
HSV 2 DNA: NEGATIVE

## 2017-05-13 LAB — URINE CULTURE

## 2017-05-13 MED ORDER — VITAMIN B-1 100 MG PO TABS
100.0000 mg | ORAL_TABLET | Freq: Every day | ORAL | Status: DC
  Administered 2017-05-14: 100 mg via ORAL
  Filled 2017-05-13: qty 1
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
## 2017-05-13 NOTE — Evaluation (Signed)
Physical Therapy Evaluation Patient Details Name: Elizabeth [name] MRN: [id number] DOB: 12-Jan-1941 Today's Date: 05/13/2017   History of Present Illness  Pt is a 76 y.o. female with medical history significant for HTN, HLD, , depression, IBS, insomnia, anxiety, headaches, mild dementia, dizziness, multiple falls, and congenital absence of the left hand.  [facility] on 05/11/17 with significant decline in mobility, unable to ambulate, dysarthria, and encephalopathy.  Clinical Impression  Patient presents with problems listed below.  Will benefit from acute PT to maximize functional mobility prior to return to SNF for continued therapy.    Follow Up Recommendations SNF;Supervision/Assistance - 24 hour    Equipment Recommendations  Cane    Recommendations for Other Services       Precautions / Restrictions Precautions Precautions: Fall Precaution Comments: watch BP; multiple falls pta Restrictions Weight Bearing Restrictions: No      Mobility  Bed Mobility Overal bed mobility: Needs Assistance Bed Mobility: Supine to Sit;Sit to Supine     Supine to sit: Min assist Sit to supine: Min guard   General bed mobility comments: Verbal cues for sequencing.  Patient unable to utilize rail due to decreased shoulder ROM.  Min assist to elevate trunk to sitting position.  Once upright, patient with good sitting balance.  Transfers Overall transfer level: Needs assistance Equipment used: 1 person hand held assist Transfers: Sit to/from Stand Sit to Stand: Min assist         General transfer comment: Assist to steady during transfer.  Patient with significant posterior lean in stance.  Patient reports feeling "dizzy" in standing.  She describes dizzy as "off balance".   Practiced sit > stand x2.  Ambulation/Gait Ambulation/Gait assistance: Mod assist;+2 safety/equipment Ambulation Distance (Feet): 30 Feet Assistive device: 1 person hand held assist Gait  Pattern/deviations: Step-through pattern;Decreased step length - right;Decreased step length - left;Shuffle;Ataxic;Narrow base of support Gait velocity: decreased Gait velocity interpretation: Below normal speed for age/gender General Gait Details: Patient with unsteady, ataxic gait, with significant posterior lean.  Requires mod assist for balance during gait to prevent fall.  Stairs            Wheelchair Mobility    Modified Rankin (Stroke Patients Only)       Balance Overall balance assessment: Needs assistance Sitting-balance support: No upper extremity supported;Feet supported Sitting balance-Leahy Scale: Good Sitting balance - Comments: Patient able to reach forward, moving trunk forward over knees, and return to upright posture.   Standing balance support: Single extremity supported Standing balance-Leahy Scale: Poor Standing balance comment: retropulsive, unsteady, pt reports "off balance"                             Pertinent Vitals/Pain Pain Assessment: No/denies pain    Home Living Family/patient expects to be discharged to:: Skilled nursing facility Living Arrangements: Children (son, who is not able to fully assist patient)             Home Equipment: None      Prior Function Level of Independence: Independent         Comments: Prior to recent hospitalization     Hand Dominance   Dominant Hand: Right    Extremity/Trunk Assessment   Upper Extremity Assessment Upper Extremity Assessment: RUE deficits/detail;LUE deficits/detail RUE Deficits / Details: Arthritic changes in hand and shoulder.  Shoulder with decreased flexion and ext rotation.   RUE Coordination: decreased gross motor LUE Deficits / Details:  congenital defect, no hand;  Limited shoulder flexion ROM    Lower Extremity Assessment Lower Extremity Assessment: Generalized weakness       Communication   Communication: Expressive difficulties  Cognition  Arousal/Alertness: Awake/alert Behavior During Therapy: Restless;Anxious;Impulsive Overall Cognitive Status: Impaired/Different from baseline Area of Impairment: Orientation;Attention;Memory;Safety/judgement;Following commands;Awareness;Problem solving                 Orientation Level: Disoriented to;Place;Time;Situation Current Attention Level: Sustained Memory: Decreased short-term memory Following Commands: Follows one step commands with increased time Safety/Judgement: Decreased awareness of deficits;Decreased awareness of safety   Problem Solving: Slow processing;Difficulty sequencing;Requires verbal cues        General Comments      Exercises     Assessment/Plan    PT Assessment Patient needs continued PT services  PT Problem List Decreased strength;Decreased range of motion;Decreased balance;Decreased mobility;Decreased coordination;Decreased cognition;Decreased knowledge of use of DME;Decreased safety awareness       PT Treatment Interventions DME instruction;Gait training;Functional mobility training;Therapeutic activities;Balance training;Cognitive remediation;Patient/family education    PT Goals (Current goals can be found in the Care Plan section)  Acute Rehab PT Goals Patient Stated Goal: Go to rehab to get stronger PT Goal Formulation: With patient/family Time For Goal Achievement: 05/20/17 Potential to Achieve Goals: Good    Frequency Min 2X/week   Barriers to discharge Decreased caregiver support      Co-evaluation               AM-PAC PT "6 Clicks" Daily Activity  Outcome Measure Difficulty turning over in bed (including adjusting bedclothes, sheets and blankets)?: None Difficulty moving from lying on back to sitting on the side of the bed? : Total Difficulty sitting down on and standing up from a chair with arms (e.g., wheelchair, bedside commode, etc,.)?: Total Help needed moving to and from a bed to chair (including a wheelchair)?: A  Little Help needed walking in hospital room?: A Lot Help needed climbing 3-5 steps with a railing? : Total 6 Click Score: 12    End of Session Equipment Utilized During Treatment: Gait belt Activity Tolerance: Patient tolerated treatment well Patient left: in bed;with call bell/phone within reach;with bed alarm set;with family/visitor present   PT Visit Diagnosis: Unsteadiness on feet (R26.81);Other abnormalities of gait and mobility (R26.89);Repeated falls (R29.6);Muscle weakness (generalized) (M62.81);Ataxic gait (R26.0);Dizziness and giddiness (R42)    Time: 4098-1191 PT Time Calculation (min) (ACUTE ONLY): 19 min   Charges:   PT Evaluation $PT Eval Moderate Complexity: 1 Procedure     PT G Codes:        Durenda Hurt. Renaldo Fiddler, [facility] Acute Rehab Services Pager [phone]   Vena Austria 05/13/2017, 4:36 PM

## 2017-05-13 NOTE — Progress Notes (Signed)
Spoke with family currently present in the room. These family members are from out of [place] but are very familiar with her living situation. States son that lives with her has moderate Mental Retardation but drives and can take care of himself. Patient's baseline is usually independent, works outdoors a lot so the concern is that patient could have been bitten by a tick or other insect that could be contributing to her decline in cognition. Also that she could have been exposed to LED in her home. Not sure why they have the LED concern but agrees that she seems to be getting more demented.

## 2017-05-13 NOTE — Progress Notes (Signed)
Subjective: Sleeping comfortably.   Objective: Current vital signs: BP (!) 140/57 (BP Location: Left Arm)   Pulse 84   Temp 98.2 F (36.8 C)   Resp 18   Ht '5\' 3"'  (1.6 m)   Wt 57.7 kg (127 lb 4.8 oz)   SpO2 97%   BMI 22.55 kg/m  Vital signs in last 24 hours: Temp:  [97.6 F (36.4 C)-98.2 F (36.8 C)] 98.2 F (36.8 C) (06/02 0511) Pulse Rate:  [81-88] 84 (06/02 0511) Resp:  [16-18] 18 (06/02 0511) BP: (123-142)/(49-70) 140/57 (06/02 0511) SpO2:  [97 %-98 %] 97 % (06/02 0511)  Intake/Output from previous day: 06/01 0701 - 06/02 0700 In: 931.7 [P.O.:20; I.V.:911.7] Out: 100 [Urine:100] Intake/Output this shift: No intake/output data recorded. Nutritional status: Diet full liquid Room service appropriate? Yes; Fluid consistency: Thin  Neurologic Exam: Ment: Drowsy. Answers simple questions and follows simple commands. No agitation noted. Flattened affect. Poverty of speech.  CN: Conjugate EOM without nystagmus or forced gaze deviation. Face symmetric.  Motor: Moves all 4 extremities to command without asymmetry.   Lab Results: Results for orders placed or performed during the hospital encounter of 05/11/17 (from the past 48 hour(s))  CBC with Differential/Platelet     Status: None   Collection Time: 05/11/17  6:11 PM  Result Value Ref Range   WBC 7.5 4.0 - 10.5 K/uL   RBC 4.59 3.87 - 5.11 MIL/uL   Hemoglobin 12.7 12.0 - 15.0 g/dL   HCT 39.6 36.0 - 46.0 %   MCV 86.3 78.0 - 100.0 fL   MCH 27.7 26.0 - 34.0 pg   MCHC 32.1 30.0 - 36.0 g/dL   RDW 15.5 11.5 - 15.5 %   Platelets 308 150 - 400 K/uL   Neutrophils Relative % 67 %   Neutro Abs 5.0 1.7 - 7.7 K/uL   Lymphocytes Relative 21 %   Lymphs Abs 1.6 0.7 - 4.0 K/uL   Monocytes Relative 8 %   Monocytes Absolute 0.6 0.1 - 1.0 K/uL   Eosinophils Relative 4 %   Eosinophils Absolute 0.3 0.0 - 0.7 K/uL   Basophils Relative 0 %   Basophils Absolute 0.0 0.0 - 0.1 K/uL  Basic metabolic panel     Status: Abnormal    Collection Time: 05/11/17  6:11 PM  Result Value Ref Range   Sodium 139 135 - 145 mmol/L   Potassium 3.8 3.5 - 5.1 mmol/L   Chloride 102 101 - 111 mmol/L   CO2 30 22 - 32 mmol/L   Glucose, Bld 89 65 - 99 mg/dL   BUN 12 6 - 20 mg/dL   Creatinine, Ser 0.97 0.44 - 1.00 mg/dL   Calcium 9.9 8.9 - 10.3 mg/dL   GFR calc non Af Amer 55 (L) >60 mL/min   GFR calc Af Amer >60 >60 mL/min    Comment: (NOTE) The eGFR has been calculated using the CKD EPI equation. This calculation has not been validated in all clinical situations. eGFR's persistently <60 mL/min signify possible Chronic Kidney Disease.    Anion gap 7 5 - 15  Urinalysis, Routine w reflex microscopic     Status: Abnormal   Collection Time: 05/11/17  6:31 PM  Result Value Ref Range   Color, Urine YELLOW YELLOW   APPearance CLOUDY (A) CLEAR   Specific Gravity, Urine 1.013 1.005 - 1.030   pH 7.0 5.0 - 8.0   Glucose, UA NEGATIVE NEGATIVE mg/dL   Hgb urine dipstick NEGATIVE NEGATIVE   Bilirubin Urine NEGATIVE  NEGATIVE   Ketones, ur NEGATIVE NEGATIVE mg/dL   Protein, ur NEGATIVE NEGATIVE mg/dL   Nitrite NEGATIVE NEGATIVE   Leukocytes, UA LARGE (A) NEGATIVE   RBC / HPF 0-5 0 - 5 RBC/hpf   WBC, UA 6-30 0 - 5 WBC/hpf   Bacteria, UA RARE (A) NONE SEEN   Squamous Epithelial / LPF 0-5 (A) NONE SEEN   Mucous PRESENT    Amorphous Crystal PRESENT   Urine rapid drug screen (hosp performed)     Status: Abnormal   Collection Time: 05/11/17  6:31 PM  Result Value Ref Range   Opiates NONE DETECTED NONE DETECTED   Cocaine NONE DETECTED NONE DETECTED   Benzodiazepines POSITIVE (A) NONE DETECTED   Amphetamines NONE DETECTED NONE DETECTED   Tetrahydrocannabinol NONE DETECTED NONE DETECTED   Barbiturates NONE DETECTED NONE DETECTED    Comment:        DRUG SCREEN FOR MEDICAL PURPOSES ONLY.  IF CONFIRMATION IS NEEDED FOR ANY PURPOSE, NOTIFY LAB WITHIN 5 DAYS.        LOWEST DETECTABLE LIMITS FOR URINE DRUG SCREEN Drug Class       Cutoff  (ng/mL) Amphetamine      1000 Barbiturate      200 Benzodiazepine   620 Tricyclics       355 Opiates          300 Cocaine          300 THC              50   Basic metabolic panel     Status: Abnormal   Collection Time: 05/12/17  1:14 AM  Result Value Ref Range   Sodium 138 135 - 145 mmol/L   Potassium 4.2 3.5 - 5.1 mmol/L   Chloride 102 101 - 111 mmol/L   CO2 27 22 - 32 mmol/L   Glucose, Bld 100 (H) 65 - 99 mg/dL   BUN 11 6 - 20 mg/dL   Creatinine, Ser 0.89 0.44 - 1.00 mg/dL   Calcium 9.7 8.9 - 10.3 mg/dL   GFR calc non Af Amer >60 >60 mL/min   GFR calc Af Amer >60 >60 mL/min    Comment: (NOTE) The eGFR has been calculated using the CKD EPI equation. This calculation has not been validated in all clinical situations. eGFR's persistently <60 mL/min signify possible Chronic Kidney Disease.    Anion gap 9 5 - 15  CBC     Status: None   Collection Time: 05/12/17  1:14 AM  Result Value Ref Range   WBC 8.6 4.0 - 10.5 K/uL   RBC 4.59 3.87 - 5.11 MIL/uL   Hemoglobin 12.5 12.0 - 15.0 g/dL   HCT 39.5 36.0 - 46.0 %   MCV 86.1 78.0 - 100.0 fL   MCH 27.2 26.0 - 34.0 pg   MCHC 31.6 30.0 - 36.0 g/dL   RDW 15.3 11.5 - 15.5 %   Platelets 285 150 - 400 K/uL  Ammonia     Status: None   Collection Time: 05/12/17  1:14 AM  Result Value Ref Range   Ammonia 16 9 - 35 umol/L  Glucose, CSF     Status: None   Collection Time: 05/12/17 10:23 AM  Result Value Ref Range   Glucose, CSF 51 40 - 70 mg/dL  Protein, CSF     Status: Abnormal   Collection Time: 05/12/17 10:23 AM  Result Value Ref Range   Total  Protein, CSF 274 (H) 15 - 45 mg/dL  Comment: RESULTS CONFIRMED BY MANUAL DILUTION XANTHOCHROMIC   CSF culture     Status: None (Preliminary result)   Collection Time: 05/12/17 10:23 AM  Result Value Ref Range   Specimen Description CSF    Special Requests TUBE 2    Gram Stain      WBC PRESENT,BOTH PMN AND MONONUCLEAR NO ORGANISMS SEEN CYTOSPIN SMEAR    Culture PENDING     Report Status PENDING   CSF cell count with differential     Status: Abnormal   Collection Time: 05/12/17 10:53 AM  Result Value Ref Range   Tube # 3    Color, CSF RED (A) COLORLESS   Appearance, CSF CLOUDY (A) CLEAR   Supernatant SLIGHT     Comment: XANTHOCHROMIC   RBC Count, CSF 32,000 (H) 0 /cu mm   WBC, CSF 8 (H) 0 - 5 /cu mm   Other Cells, CSF TOO FEW TO COUNT, SMEAR AVAILABLE FOR REVIEW     Comment: RARE NEUTROPHIL AND LYMPOCYTE SEEN ON SCAN.  Cryptococcal antigen, CSF     Status: None   Collection Time: 05/12/17 10:53 AM  Result Value Ref Range   Crypto Ag NEGATIVE NEGATIVE   Cryptococcal Ag Titer NOT INDICATED NOT INDICATED  Lactate dehydrogenase (pleural or peritoneal fluid)     Status: Abnormal   Collection Time: 05/12/17 10:53 AM  Result Value Ref Range   LD, Fluid 40 (H) 3 - 23 U/L    Comment: (NOTE) Results should be evaluated in conjunction with serum values    Fluid Type-FLDH CSF     Recent Results (from the past 240 hour(s))  CSF culture     Status: None (Preliminary result)   Collection Time: 05/12/17 10:23 AM  Result Value Ref Range Status   Specimen Description CSF  Final   Special Requests TUBE 2  Final   Gram Stain   Final    WBC PRESENT,BOTH PMN AND MONONUCLEAR NO ORGANISMS SEEN CYTOSPIN SMEAR    Culture PENDING  Incomplete   Report Status PENDING  Incomplete    Lipid Panel No results for input(s): CHOL, TRIG, HDL, CHOLHDL, VLDL, LDLCALC in the last 72 hours.  Studies/Results: Ct Head Wo Contrast  Result Date: 05/11/2017 CLINICAL DATA:  Slurred speech and confusion. EXAM: CT HEAD WITHOUT CONTRAST TECHNIQUE: Contiguous axial images were obtained from the base of the skull through the vertex without intravenous contrast. COMPARISON:  Brain MRI 04/18/2017.  Head CT 04/18/2017. FINDINGS: Brain: There is no evidence for acute hemorrhage, hydrocephalus, mass lesion, or abnormal extra-axial fluid collection. No definite CT evidence for acute infarction.  Diffuse loss of parenchymal volume is consistent with atrophy. Patchy low attenuation in the deep hemispheric and periventricular white matter is nonspecific, but likely reflects chronic microvascular ischemic demyelination. Vascular: No hyperdense vessel or unexpected calcification. Skull: Nondisplaced right occipital skull fracture has been present on exams back to 11/21/2016. Sinuses/Orbits: The visualized paranasal sinuses and mastoid air cells are clear. Visualized portions of the globes and intraorbital fat are unremarkable. Other: None. IMPRESSION: 1. Stable exam.  No acute intracranial abnormality. 2. Atrophy with chronic small vessel white matter ischemic disease. 3. Nondisplaced right occipital skull fracture stable since 11/21/2016. Electronically Signed   By: Misty Stanley M.D.   On: 05/11/2017 18:01   Mr Brain Wo Contrast  Result Date: 05/12/2017 CLINICAL DATA:  Initial evaluation for acute altered mental status. EXAM: MRI HEAD WITHOUT CONTRAST TECHNIQUE: Multiplanar, multiecho pulse sequences of the brain and surrounding structures were obtained without  intravenous contrast. COMPARISON:  Prior CT from earlier the same day as well as prior MRI from 04/18/2017. FINDINGS: Study is markedly limited due to extensive motion artifact and patient's inability to tolerate the full length of the exam. Diffusion-weighted imaging with axial tooth T2 weighted sequence was performed. Motion degraded diffusion sequence demonstrates no evidence for acute or subacute ischemia. Cerebral atrophy with chronic small vessel ischemic disease again noted. Probable tiny remote bilateral cerebellar infarcts and remote right basal ganglia lacunar infarct. No mass lesion, mass effect, or midline shift identified on this limited exam. Mild ventricular prominence related atrophy without hydrocephalus. No extra-axial fluid collection. Major intracranial vascular flow voids are grossly maintained. Globes and orbital soft tissues  grossly unremarkable. Patient status post lens extraction bilaterally. Mild mucosal thickening within the right maxillary sinus. Paranasal sinuses are otherwise clear. No mastoid effusion. Scalp soft tissues and calvarium grossly unremarkable. IMPRESSION: 1. Severely limited study due to extensive motion artifact and patient's inability to tolerate the full length of the exam. 2. No definite acute intracranial process identified. 3. Stable atrophy with chronic small vessel ischemic disease. Electronically Signed   By: Jeannine Boga M.D.   On: 05/12/2017 20:44   Dg Cyndy Freeze Guided Loc Of Needle/cath Tip For Spinal Inject Lt  Result Date: 05/12/2017 CLINICAL DATA:  Mental status changes.  Encephalopathy. EXAM: DIAGNOSTIC LUMBAR PUNCTURE UNDER FLUOROSCOPIC GUIDANCE FLUOROSCOPY TIME:  Fluoroscopy Time:  4 minutes and 50 seconds Radiation Exposure Index (if provided by the fluoroscopic device): Not applicable. Number of Acquired Spot Images: 1 PROCEDURE: Informed consent was obtained from the patient prior to the procedure, including potential complications of headache, allergy, and pain. With the patient prone, the lower back was prepped with Betadine. 1% Lidocaine was used for local anesthesia. Lumbar puncture was performed at the L3-4 level using a 20 gauge needle with return of pink CSF. Opening pressure could not be measured secondary to patient immobility and relatively low qualitative pressure. 8 ml of CSF were obtained for laboratory studies. The patient tolerated the procedure well and there were no apparent complications. IMPRESSION: Non complicated lumbar puncture as detailed above. Electronically Signed   By: Abigail Miyamoto M.D.   On: 05/12/2017 11:19    Medications:   Current Facility-Administered Medications:  .  0.9 %  sodium chloride infusion, , Intravenous, Continuous, Karmen Bongo, MD, Last Rate: 50 mL/hr at 05/12/17 1636 .  acetaminophen (TYLENOL) tablet 650 mg, 650 mg, Oral, Q6H PRN  **OR** acetaminophen (TYLENOL) suppository 650 mg, 650 mg, Rectal, Q6H PRN, Karmen Bongo, MD .  bisacodyl (DULCOLAX) suppository 10 mg, 10 mg, Rectal, Daily PRN, Karmen Bongo, MD .  cefTRIAXone (ROCEPHIN) 1 g in dextrose 5 % 50 mL IVPB, 1 g, Intravenous, Q24H, Hongalgi, Lenis Dickinson, MD, Stopped at 05/12/17 2255 .  chlorhexidine (PERIDEX) 0.12 % solution 15 mL, 15 mL, Mouth Rinse, BID, Hongalgi, Anand D, MD, 15 mL at 05/12/17 2226 .  LORazepam (ATIVAN) tablet 1 mg, 1 mg, Oral, Q6H PRN **OR** LORazepam (ATIVAN) injection 1 mg, 1 mg, Intravenous, Q6H PRN, Hongalgi, Anand D, MD .  MEDLINE mouth rinse, 15 mL, Mouth Rinse, q12n4p, Hongalgi, Anand D, MD .  metoprolol tartrate (LOPRESSOR) tablet 25 mg, 25 mg, Oral, BID, Karmen Bongo, MD, 25 mg at 05/12/17 0057 .  ondansetron (ZOFRAN) tablet 4 mg, 4 mg, Oral, Q6H PRN **OR** ondansetron (ZOFRAN) injection 4 mg, 4 mg, Intravenous, Q6H PRN, Karmen Bongo, MD .  polyethylene glycol (MIRALAX / GLYCOLAX) packet 17 g, 17 g, Oral, Daily,  Karmen Bongo, MD, 17 g at 05/12/17 1551 .  thiamine (B-1) injection 100 mg, 100 mg, Intravenous, Daily, Greta Doom, MD, 100 mg at 05/12/17 0350    Assessment/Plan: Worsening cognition in a 76 year old female without prior diagnosis of dementia, but with diffuse brain atrophy on imaging.  1. MRI completed. In the context of the technical limitations of the study, no definite acute intracranial process is identified. There is extensive, stable atrophy with chronic small vessel ischemic disease. I see no significant cortical hyperintensity on DWI, which militates against CJD. Diffuse atrophy consistent with decreased neurological reserve and possible chronic dementing process (the most common, in decreasing order of yearly incidence, are Alzheimer's dementia, vascular dementia and Lewy body dementia).  2. EEG was abnormal, secondary to posterior background slowing. This finding may be seen with a diffuse gray matter  disturbance that is etiologically nonspecific, but may include a dementia, among other possibilities.  No epileptiform activity is noted.   3. Continue thiamine. Level is pending.  4. Continue to hold sedating medications, including antidepressants with anticholinergic side effects. 5. OOB to chair and blinds open with lights on during the day; lights off at night to maintain diurnal cycle.  6. CSF results consistent with bloody tap. After waiting 5 days, obtain a second tap with VIR and reorder original labs. This can also be done as an outpatient with close Neurology follow up. If repeat tap shows elevated protein or IgG index, she should be readmitted for assessment of possible autoimmune encephalopathy.  7. If repeat tap is negative, will need outpatient dementia evaluation with either [facility].  .   LOS: 2 days   '@Electronically'  signed: Dr. Kerney Elbe 05/13/2017  7:38 AM

## 2017-05-13 NOTE — Clinical Social Work Note (Signed)
Clinical Social Work Assessment  Patient Details  Name: Elizabeth [name] MRN: [id number] Date of Birth: [birth year]  Date of referral:  05/13/17               Reason for consult:  Discharge Planning                Permission sought to share information with:  Facility Medical sales representativeContact Representative, Family Supports Permission granted to share information::  No  Name::     Conservation officer, historic buildingsJennifer  Agency::  [facility]  Relationship::  Daughter   Contact Information:  [phone]  Housing/Transportation Living arrangements for the past 2 months:  Single Family Home, Skilled Nursing Facility Source of Information:  Adult Children Patient Interpreter Needed:  None Criminal Activity/Legal Involvement Pertinent to Current Situation/Hospitalization:  No - Comment as needed Significant Relationships:  Adult Children Lives with:  Adult Children Do you feel safe going back to the place where you live?  No Need for family participation in patient care:  Yes (Comment)  Care giving concerns:  CSW received consult regarding discharge planning. CSW spoke with patient's daughter. [facility] coming back to the hospital. CSW to continue to follow and assist with discharge planning needs.   Social Worker assessment / plan:  CSW spoke with patient's daughter concerning [facility] before [facility] home.  Employment status:  Retired Health and safety inspectornsurance information:  Medicare PT Recommendations:  Not assessed at this time Information / Referral to community resources:  Skilled Nursing Facility  Patient/Family's Response to care:  Patient's daughter reports agreement with discharge plan [facility]. She denied doing a bed hold and understands that the patient may [facility] depending on availability.   Patient/Family's Understanding of and Emotional Response to Diagnosis, Current Treatment, and Prognosis:  Patient/family is realistic regarding therapy needs  and expressed being hopeful for SNF placement. Patient's daughter expressed understanding of CSW role and discharge process. She is hopeful that the medical team can figure out what is going on with the patient this admission. No questions/concerns about plan or treatment.    Emotional Assessment Appearance:  Appears stated age Attitude/Demeanor/Rapport:  Unable to Assess Affect (typically observed):  Unable to Assess Orientation:  Oriented to Self Alcohol / Substance use:  Not Applicable Psych involvement (Current and /or in the community):  No (Comment)  Discharge Needs  Concerns to be addressed:  Care Coordination Readmission within the last 30 days:  Yes Current discharge risk:  None Barriers to Discharge:  Continued Medical Work up   [employer] Talyn Eddie, LCSWA 05/13/2017, 8:07 AM

## 2017-05-14 DIAGNOSIS — G9340 Encephalopathy, unspecified: Secondary | ICD-10-CM | POA: Diagnosis not present

## 2017-05-14 DIAGNOSIS — R488 Other symbolic dysfunctions: Secondary | ICD-10-CM | POA: Diagnosis not present

## 2017-05-14 DIAGNOSIS — R4189 Other symptoms and signs involving cognitive functions and awareness: Secondary | ICD-10-CM | POA: Diagnosis not present

## 2017-05-14 DIAGNOSIS — I951 Orthostatic hypotension: Secondary | ICD-10-CM | POA: Diagnosis not present

## 2017-05-14 DIAGNOSIS — R2681 Unsteadiness on feet: Secondary | ICD-10-CM | POA: Diagnosis not present

## 2017-05-14 DIAGNOSIS — R531 Weakness: Secondary | ICD-10-CM | POA: Diagnosis not present

## 2017-05-14 DIAGNOSIS — M6281 Muscle weakness (generalized): Secondary | ICD-10-CM | POA: Diagnosis not present

## 2017-05-14 DIAGNOSIS — R296 Repeated falls: Secondary | ICD-10-CM | POA: Diagnosis not present

## 2017-05-14 DIAGNOSIS — R413 Other amnesia: Secondary | ICD-10-CM | POA: Diagnosis not present

## 2017-05-14 DIAGNOSIS — R278 Other lack of coordination: Secondary | ICD-10-CM | POA: Diagnosis not present

## 2017-05-14 MED ORDER — DEXTROSE 5 % IV SOLN
1.0000 g | Freq: Once | INTRAVENOUS | Status: AC
  Administered 2017-05-14: 1 g via INTRAVENOUS
  Filled 2017-05-14: qty 10

## 2017-05-14 MED ORDER — THIAMINE HCL 100 MG PO TABS: 100.0000 mg | ORAL_TABLET | Freq: Every day | ORAL | Status: DC

## 2017-05-14 NOTE — Clinical Social Work Placement (Signed)
   CLINICAL SOCIAL WORK PLACEMENT  NOTE  Date:  05/14/2017  Patient Details  Name: Kennieth Radmily R Lapaglia MRN: [id number] Date of Birth: May 10, 1941  Clinical Social Work is seeking post-discharge placement for this patient at the Skilled  Nursing Facility level of care (*CSW will initial, date and re-position this form in  chart as items are completed):  Yes   Patient/family provided with [place] Clinical Social Work Department's list of facilities offering this level of care within the geographic area requested by the patient (or if unable, by the patient's family).  Yes   Patient/family informed of their freedom to choose among providers that offer the needed level of care, that participate in Medicare, Medicaid or managed care program needed by the patient, have an available bed and are willing to accept the patient.  Yes   Patient/family informed of [facility]'s ownership interest in [facility] and [facility], as well as of the fact that they are under no obligation to receive care at these facilities.  PASRR submitted to EDS on 05/03/17     PASRR number received on 05/03/17     Existing PASRR number confirmed on 05/14/17     FL2 transmitted to all facilities in geographic area requested by pt/family on 05/03/17     FL2 transmitted to all facilities within larger geographic area on       Patient informed that his/her managed care company has contracts with or will negotiate with certain facilities, including the following:        Yes   Patient/family informed of bed offers received.  Patient chooses bed at  [facility]     Physician recommends and patient chooses bed at      Patient to be transferred to  [facility] on 05/14/17.  Patient to be transferred to facility by  [facility])     Patient family notified on 05/14/17 of transfer.  Name of family member notified:  Family @ bedside     PHYSICIAN       Additional Comment:     _______________________________________________ Norlene DuelBROWN, Delvin Hedeen B, LCSWA 05/14/2017, 1:44 PM
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
## 2017-05-14 NOTE — Discharge Instructions (Signed)

## 2017-05-14 NOTE — Clinical Social Work Note (Signed)
CSW notified pt is ready for DC. CSW contacted [facility],[place] to [place], no bed available for pt and family did not pay to secure bed while pt was hospitalized. The family understands they will have to pick another facility. Dtr will consult other sibling and call CSW back with choice.  CSW will continue to follow.  Aslyn Cottman B. Gean QuintBrown,MSW, LCSWA Clinical Social Work Dept Weekend Social Worker [phone] 11:36 AM

## 2017-05-14 NOTE — Clinical Social Work Note (Signed)
Family [facility] CS[facility] CSW notified facility and cancelled [facility] transport.  Pt has no other DC needs identified at this time.  CSW is signing off.  Daquisha Clermont B. Gean QuintBrown,MSW, LCSWA Clinical Social Work Dept Weekend Social Worker [phone] 2:45 PM

## 2017-05-14 NOTE — Progress Notes (Addendum)
Nsg Discharge Note  Admit Date:  05/11/2017 Discharge date: 05/14/2017   Kennieth RadEmily R Salvato to be D/C'd Skilled nursing facility per MD order.  AVS completed.  Copy for chart, and copy for patient signed, and dated. Patient/caregiver able to verbalize understanding.  Discharge Medication: Allergies as of 05/14/2017      Reactions   Norvasc [amlodipine] Swelling   Benicar [olmesartan] Other (See Comments)   Nervousness   Lisinopril Cough      Medication List    STOP taking these medications   buPROPion 150 MG 24 hr tablet Commonly known as:  WELLBUTRIN XL   LORazepam 0.5 MG tablet Commonly known as:  ATIVAN   meclizine 12.5 MG tablet Commonly known as:  ANTIVERT   nortriptyline 75 MG capsule Commonly known as:  PAMELOR     TAKE these medications   bisacodyl 10 MG suppository Commonly known as:  DULCOLAX Place 1 suppository (10 mg total) rectally daily as needed for moderate constipation. What changed:  reasons to take this   metoprolol tartrate 25 MG tablet Commonly known as:  LOPRESSOR Take 25 mg by mouth 2 (two) times daily.   polyethylene glycol packet Commonly known as:  MIRALAX / GLYCOLAX Take 17 g by mouth daily.   thiamine 100 MG tablet Take 1 tablet (100 mg total) by mouth daily. Start taking on:  05/15/2017   UNABLE TO FIND Med Pass: Drink 60 ml's (2 ounces) by mouth three times a day       Discharge Assessment: Vitals:   05/14/17 0112 05/14/17 0617  BP: 105/81 (!) 144/61  Pulse: 79 82  Resp: 17 18  Temp: 98.2 F (36.8 C) 98.5 F (36.9 C)   Skin clean, dry and intact without evidence of skin break down, no evidence of skin tears noted. IV catheter discontinued intact. Site without signs and symptoms of complications - no redness or edema noted at insertion site, patient denies c/o pain - only slight tenderness at site.  Dressing with slight pressure applied.  D/c Instructions-Education: Discharge instructions given to patient/family with  verbalized understanding. D/c education completed with patient/family including follow up instructions, medication list, d/c activities limitations if indicated, with other d/c instructions as indicated by MD - patient able to verbalize understanding, all questions fully answered. Patient instructed to return to ED, call 911, or call MD for any changes in condition.  [facility] Pt will go to room 401. Patient escorted via WC, and [facility] with daughter.   Camillo FlamingVicki L Sanjay Broadfoot, RN 05/14/2017 2:55 PM

## 2017-05-14 NOTE — Clinical Social Work Note (Signed)
Clinical Social Worker facilitated patient discharge including contacting patient family (Dtr @ bedside) and facility ([facility]) to confirm patient discharge plans.  Clinical information faxed to facility and family agreeable with plan.  CSW arranged [facility]. RN to call report prior to discharge.  Clinical Social Worker will sign off for now as social work intervention is no longer needed. Please consult us again if new need arises.  Haedyn Ancrum B. Gean QuintBrown,MSW, LCSWA Clinical Social Work Dept Weekend Social Worker [phone] 1:42 PM

## 2017-05-15 ENCOUNTER — Ambulatory Visit: Payer: Medicare Other | Admitting: Neurology

## 2017-05-15 LAB — CSF CULTURE W GRAM STAIN: Culture: NO GROWTH

## 2017-05-15 LAB — VARICELLA-ZOSTER BY PCR: Varicella-Zoster, PCR: NEGATIVE

## 2017-05-15 LAB — VDRL, CSF: VDRL Quant, CSF: NONREACTIVE

## 2017-05-16 LAB — IGG CSF INDEX
Albumin CSF-mCnc: 172 mg/dL — ABNORMAL HIGH (ref 11–48)
Albumin: 4 g/dL (ref 3.5–4.8)
CSF IgG Index: 0.9 — ABNORMAL HIGH (ref 0.0–0.7)
IgG (Immunoglobin G), Serum: 638 mg/dL — ABNORMAL LOW (ref 700–1600)
IgG, CSF: 23.9 mg/dL — ABNORMAL HIGH (ref 0.0–8.6)
IgG/Alb Ratio, CSF: 0.14 (ref 0.00–0.25)

## 2017-05-16 LAB — PATHOLOGIST SMEAR REVIEW

## 2017-05-16 LAB — VITAMIN B1: Vitamin B1 (Thiamine): 149.7 nmol/L (ref 66.5–200.0)

## 2017-05-18 LAB — MISC LABCORP TEST (SEND OUT): Labcorp test code: 9985

## 2017-05-23 ENCOUNTER — Telehealth: Payer: Self-pay | Admitting: Adult Health

## 2017-05-23 NOTE — Telephone Encounter (Signed)
Elizabeth [name] , Per Aundra Milletmegan pt can be scheduled with Eber JonesCarolyn in a work in slot for a hosp f/u altered mental status. You can reach her Daughter Victorino DikeJennifer @ [phone] to schedule the apt.

## 2017-05-23 NOTE — Telephone Encounter (Signed)
Katrina and Dr. Terrace ArabiaYan,  Please review the hospital discharge summary for this pt. If appropriate, may we schedule this pt as a one time work in pt with Eber Jonesarolyn, NP while Dr. Terrace ArabiaYan is still here? Aundra Millet(Megan, NP is full.) Or, is it more appropriate for this pt to be worked in with Dr. Terrace ArabiaYan before she leaves on vacation?

## 2017-05-23 NOTE — Telephone Encounter (Signed)
It is Ok to get her to my schedule. Add on June 14th, last patient 4pm.

## 2017-05-23 NOTE — Telephone Encounter (Signed)
I called pt. I advised her that Dr. Terrace ArabiaYan wants to see her following her recent hospitalization. Pt is in rehab until Thursday of this week but is agreeable to seeing Dr. Terrace ArabiaYan on Thursday, 05/25/2017 at 4:00pm. Pt verbalized understanding of new appt date and time.

## 2017-05-24 DIAGNOSIS — I951 Orthostatic hypotension: Secondary | ICD-10-CM | POA: Diagnosis not present

## 2017-05-24 DIAGNOSIS — R296 Repeated falls: Secondary | ICD-10-CM | POA: Diagnosis not present

## 2017-05-24 DIAGNOSIS — R4189 Other symptoms and signs involving cognitive functions and awareness: Secondary | ICD-10-CM | POA: Diagnosis not present

## 2017-05-25 ENCOUNTER — Ambulatory Visit (INDEPENDENT_AMBULATORY_CARE_PROVIDER_SITE_OTHER): Payer: Medicare Other | Admitting: Neurology

## 2017-05-25 VITALS — BP 154/86 | HR 90 | Ht 63.0 in | Wt 120.5 lb

## 2017-05-25 DIAGNOSIS — R413 Other amnesia: Secondary | ICD-10-CM | POA: Diagnosis not present

## 2017-05-25 DIAGNOSIS — R531 Weakness: Secondary | ICD-10-CM | POA: Diagnosis not present

## 2017-05-25 LAB — HSV(HERPES SMPLX VRS)ABS-I+II(IGG)-CSF
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
## 2017-05-26 DIAGNOSIS — F0390 Unspecified dementia without behavioral disturbance: Secondary | ICD-10-CM | POA: Diagnosis not present

## 2017-05-26 DIAGNOSIS — R296 Repeated falls: Secondary | ICD-10-CM | POA: Diagnosis not present

## 2017-05-26 DIAGNOSIS — Z9181 History of falling: Secondary | ICD-10-CM | POA: Diagnosis not present

## 2017-05-26 DIAGNOSIS — I951 Orthostatic hypotension: Secondary | ICD-10-CM | POA: Diagnosis not present

## 2017-05-26 DIAGNOSIS — F411 Generalized anxiety disorder: Secondary | ICD-10-CM | POA: Diagnosis not present

## 2017-05-26 DIAGNOSIS — I10 Essential (primary) hypertension: Secondary | ICD-10-CM | POA: Diagnosis not present

## 2017-05-29 DIAGNOSIS — Z9181 History of falling: Secondary | ICD-10-CM | POA: Diagnosis not present

## 2017-05-29 DIAGNOSIS — F411 Generalized anxiety disorder: Secondary | ICD-10-CM | POA: Diagnosis not present

## 2017-05-29 DIAGNOSIS — F0390 Unspecified dementia without behavioral disturbance: Secondary | ICD-10-CM | POA: Diagnosis not present

## 2017-05-29 DIAGNOSIS — I10 Essential (primary) hypertension: Secondary | ICD-10-CM | POA: Diagnosis not present

## 2017-05-29 DIAGNOSIS — R296 Repeated falls: Secondary | ICD-10-CM | POA: Diagnosis not present

## 2017-05-29 DIAGNOSIS — I951 Orthostatic hypotension: Secondary | ICD-10-CM | POA: Diagnosis not present

## 2017-05-31 ENCOUNTER — Other Ambulatory Visit: Payer: Medicare Other

## 2017-05-31 DIAGNOSIS — F0390 Unspecified dementia without behavioral disturbance: Secondary | ICD-10-CM | POA: Diagnosis not present

## 2017-05-31 DIAGNOSIS — R296 Repeated falls: Secondary | ICD-10-CM | POA: Diagnosis not present

## 2017-05-31 DIAGNOSIS — I951 Orthostatic hypotension: Secondary | ICD-10-CM | POA: Diagnosis not present

## 2017-05-31 DIAGNOSIS — Z9181 History of falling: Secondary | ICD-10-CM | POA: Diagnosis not present

## 2017-05-31 DIAGNOSIS — I10 Essential (primary) hypertension: Secondary | ICD-10-CM | POA: Diagnosis not present

## 2017-05-31 DIAGNOSIS — F411 Generalized anxiety disorder: Secondary | ICD-10-CM | POA: Diagnosis not present

## 2017-06-01 DIAGNOSIS — I10 Essential (primary) hypertension: Secondary | ICD-10-CM | POA: Diagnosis not present

## 2017-06-01 DIAGNOSIS — R296 Repeated falls: Secondary | ICD-10-CM | POA: Diagnosis not present

## 2017-06-01 DIAGNOSIS — F411 Generalized anxiety disorder: Secondary | ICD-10-CM | POA: Diagnosis not present

## 2017-06-01 DIAGNOSIS — I951 Orthostatic hypotension: Secondary | ICD-10-CM | POA: Diagnosis not present

## 2017-06-01 DIAGNOSIS — F0390 Unspecified dementia without behavioral disturbance: Secondary | ICD-10-CM | POA: Diagnosis not present

## 2017-06-01 DIAGNOSIS — Z9181 History of falling: Secondary | ICD-10-CM | POA: Diagnosis not present

## 2017-06-05 DIAGNOSIS — Z9181 History of falling: Secondary | ICD-10-CM | POA: Diagnosis not present

## 2017-06-05 DIAGNOSIS — R296 Repeated falls: Secondary | ICD-10-CM | POA: Diagnosis not present

## 2017-06-05 DIAGNOSIS — I10 Essential (primary) hypertension: Secondary | ICD-10-CM | POA: Diagnosis not present

## 2017-06-05 DIAGNOSIS — F0390 Unspecified dementia without behavioral disturbance: Secondary | ICD-10-CM | POA: Diagnosis not present

## 2017-06-05 DIAGNOSIS — F411 Generalized anxiety disorder: Secondary | ICD-10-CM | POA: Diagnosis not present

## 2017-06-05 DIAGNOSIS — I951 Orthostatic hypotension: Secondary | ICD-10-CM | POA: Diagnosis not present

## 2017-06-06 DIAGNOSIS — E2839 Other primary ovarian failure: Secondary | ICD-10-CM | POA: Diagnosis not present

## 2017-06-06 DIAGNOSIS — Z780 Asymptomatic menopausal state: Secondary | ICD-10-CM | POA: Diagnosis not present

## 2017-06-07 DIAGNOSIS — Z9181 History of falling: Secondary | ICD-10-CM | POA: Diagnosis not present

## 2017-06-07 DIAGNOSIS — I951 Orthostatic hypotension: Secondary | ICD-10-CM | POA: Diagnosis not present

## 2017-06-07 DIAGNOSIS — I10 Essential (primary) hypertension: Secondary | ICD-10-CM | POA: Diagnosis not present

## 2017-06-07 DIAGNOSIS — F0390 Unspecified dementia without behavioral disturbance: Secondary | ICD-10-CM | POA: Diagnosis not present

## 2017-06-07 DIAGNOSIS — R296 Repeated falls: Secondary | ICD-10-CM | POA: Diagnosis not present

## 2017-06-07 DIAGNOSIS — F411 Generalized anxiety disorder: Secondary | ICD-10-CM | POA: Diagnosis not present

## 2017-06-09 DIAGNOSIS — I951 Orthostatic hypotension: Secondary | ICD-10-CM | POA: Diagnosis not present

## 2017-06-09 DIAGNOSIS — I10 Essential (primary) hypertension: Secondary | ICD-10-CM | POA: Diagnosis not present

## 2017-06-09 DIAGNOSIS — F0390 Unspecified dementia without behavioral disturbance: Secondary | ICD-10-CM | POA: Diagnosis not present

## 2017-06-09 DIAGNOSIS — Z9181 History of falling: Secondary | ICD-10-CM | POA: Diagnosis not present

## 2017-06-09 DIAGNOSIS — R296 Repeated falls: Secondary | ICD-10-CM | POA: Diagnosis not present

## 2017-06-09 DIAGNOSIS — F411 Generalized anxiety disorder: Secondary | ICD-10-CM | POA: Diagnosis not present

## 2017-06-09 LAB — FUNGUS CULTURE WITH STAIN

## 2017-06-09 LAB — FUNGUS CULTURE RESULT

## 2017-06-09 LAB — FUNGAL ORGANISM REFLEX

## 2017-06-12 DIAGNOSIS — F411 Generalized anxiety disorder: Secondary | ICD-10-CM | POA: Diagnosis not present

## 2017-06-12 DIAGNOSIS — I951 Orthostatic hypotension: Secondary | ICD-10-CM | POA: Diagnosis not present

## 2017-06-12 DIAGNOSIS — Z9181 History of falling: Secondary | ICD-10-CM | POA: Diagnosis not present

## 2017-06-12 DIAGNOSIS — F0390 Unspecified dementia without behavioral disturbance: Secondary | ICD-10-CM | POA: Diagnosis not present

## 2017-06-12 DIAGNOSIS — I10 Essential (primary) hypertension: Secondary | ICD-10-CM | POA: Diagnosis not present

## 2017-06-12 DIAGNOSIS — R296 Repeated falls: Secondary | ICD-10-CM | POA: Diagnosis not present

## 2017-06-13 DIAGNOSIS — F0390 Unspecified dementia without behavioral disturbance: Secondary | ICD-10-CM | POA: Diagnosis not present

## 2017-06-13 DIAGNOSIS — I10 Essential (primary) hypertension: Secondary | ICD-10-CM | POA: Diagnosis not present

## 2017-06-13 DIAGNOSIS — R296 Repeated falls: Secondary | ICD-10-CM | POA: Diagnosis not present

## 2017-06-13 DIAGNOSIS — F411 Generalized anxiety disorder: Secondary | ICD-10-CM | POA: Diagnosis not present

## 2017-06-13 DIAGNOSIS — Z9181 History of falling: Secondary | ICD-10-CM | POA: Diagnosis not present

## 2017-06-13 DIAGNOSIS — I951 Orthostatic hypotension: Secondary | ICD-10-CM | POA: Diagnosis not present

## 2017-06-15 ENCOUNTER — Telehealth: Payer: Self-pay | Admitting: Neurology

## 2017-06-15 ENCOUNTER — Other Ambulatory Visit: Payer: Medicare Other

## 2017-06-15 DIAGNOSIS — I951 Orthostatic hypotension: Secondary | ICD-10-CM | POA: Diagnosis not present

## 2017-06-15 DIAGNOSIS — F0390 Unspecified dementia without behavioral disturbance: Secondary | ICD-10-CM | POA: Diagnosis not present

## 2017-06-15 DIAGNOSIS — Z9181 History of falling: Secondary | ICD-10-CM | POA: Diagnosis not present

## 2017-06-15 DIAGNOSIS — R296 Repeated falls: Secondary | ICD-10-CM | POA: Diagnosis not present

## 2017-06-15 DIAGNOSIS — F411 Generalized anxiety disorder: Secondary | ICD-10-CM | POA: Diagnosis not present

## 2017-06-15 DIAGNOSIS — I10 Essential (primary) hypertension: Secondary | ICD-10-CM | POA: Diagnosis not present

## 2017-06-15 NOTE — Telephone Encounter (Addendum)
Spoke to pt's daughter, Erskine SquibbJane - states her mother will not do the MRI with just oral sedation.  She tried repeatedly at the hospital without success.  Says the only way her mother can complete the scans would be in a hospital setting with full sedation.  She is aware Dr. Terrace ArabiaYan is out of the office and would like for her to make this decision when she returns (MRI benefits vs risk of sedation).

## 2017-06-15 NOTE — Telephone Encounter (Signed)
Patients daughter office requesting patient to have a sitting up right MRI.  Advised her of [place] and she is going to look at different locations also.  Please call

## 2017-06-15 NOTE — Telephone Encounter (Signed)
I called the place in [place] to see if they have any of the sit down machines they informed me they no longer have a sit down machine and they replaced it to an open lay down machine.Elizabeth [name]. I called Elizabeth [name] her daughter and informed her of this information and I offered her about the medication about calming her that she would take before she had the MRI and Elizabeth [name] informed me that she was giving that to her and that did not help her. I then told her about being sedated but I told her I would have to send a message back because some providers do not like to do those. I also informed her that Dr. Terrace ArabiaYan is out of the country right now.

## 2017-06-16 DIAGNOSIS — F0390 Unspecified dementia without behavioral disturbance: Secondary | ICD-10-CM | POA: Diagnosis not present

## 2017-06-16 DIAGNOSIS — I951 Orthostatic hypotension: Secondary | ICD-10-CM | POA: Diagnosis not present

## 2017-06-16 DIAGNOSIS — Z9181 History of falling: Secondary | ICD-10-CM | POA: Diagnosis not present

## 2017-06-16 DIAGNOSIS — F411 Generalized anxiety disorder: Secondary | ICD-10-CM | POA: Diagnosis not present

## 2017-06-16 DIAGNOSIS — R296 Repeated falls: Secondary | ICD-10-CM | POA: Diagnosis not present

## 2017-06-16 DIAGNOSIS — I10 Essential (primary) hypertension: Secondary | ICD-10-CM | POA: Diagnosis not present

## 2017-06-19 DIAGNOSIS — F411 Generalized anxiety disorder: Secondary | ICD-10-CM | POA: Diagnosis not present

## 2017-06-19 DIAGNOSIS — I951 Orthostatic hypotension: Secondary | ICD-10-CM | POA: Diagnosis not present

## 2017-06-19 DIAGNOSIS — Z9181 History of falling: Secondary | ICD-10-CM | POA: Diagnosis not present

## 2017-06-19 DIAGNOSIS — R296 Repeated falls: Secondary | ICD-10-CM | POA: Diagnosis not present

## 2017-06-19 DIAGNOSIS — F0390 Unspecified dementia without behavioral disturbance: Secondary | ICD-10-CM | POA: Diagnosis not present

## 2017-06-19 DIAGNOSIS — I10 Essential (primary) hypertension: Secondary | ICD-10-CM | POA: Diagnosis not present

## 2017-06-23 DIAGNOSIS — I951 Orthostatic hypotension: Secondary | ICD-10-CM | POA: Diagnosis not present

## 2017-06-23 DIAGNOSIS — R296 Repeated falls: Secondary | ICD-10-CM | POA: Diagnosis not present

## 2017-06-23 DIAGNOSIS — F0390 Unspecified dementia without behavioral disturbance: Secondary | ICD-10-CM | POA: Diagnosis not present

## 2017-06-23 DIAGNOSIS — I10 Essential (primary) hypertension: Secondary | ICD-10-CM | POA: Diagnosis not present

## 2017-06-23 DIAGNOSIS — Z9181 History of falling: Secondary | ICD-10-CM | POA: Diagnosis not present

## 2017-06-23 DIAGNOSIS — F411 Generalized anxiety disorder: Secondary | ICD-10-CM | POA: Diagnosis not present

## 2017-06-24 NOTE — Progress Notes (Signed)
Personally  participated in, made any corrections needed, and agree with history, physical, neuro exam,assessment and plan as stated.     Antonia Ahern, MD [facility]     

## 2017-06-25 NOTE — Telephone Encounter (Signed)
Please call patient, if she is recovering, gait has improved, may hold off MRi study, until I reevaluate her on August 1.  If she continues to get worse,may change to CT cervical to begin with

## 2017-06-26 NOTE — Telephone Encounter (Signed)
LMOM (identified vm) for Erskine SquibbJane to call back/fim

## 2017-06-27 DIAGNOSIS — F0390 Unspecified dementia without behavioral disturbance: Secondary | ICD-10-CM | POA: Diagnosis not present

## 2017-06-27 DIAGNOSIS — Z9181 History of falling: Secondary | ICD-10-CM | POA: Diagnosis not present

## 2017-06-27 DIAGNOSIS — I951 Orthostatic hypotension: Secondary | ICD-10-CM | POA: Diagnosis not present

## 2017-06-27 DIAGNOSIS — I10 Essential (primary) hypertension: Secondary | ICD-10-CM | POA: Diagnosis not present

## 2017-06-27 DIAGNOSIS — R296 Repeated falls: Secondary | ICD-10-CM | POA: Diagnosis not present

## 2017-06-27 DIAGNOSIS — F411 Generalized anxiety disorder: Secondary | ICD-10-CM | POA: Diagnosis not present

## 2017-06-28 ENCOUNTER — Ambulatory Visit: Payer: Medicare Other | Admitting: Neurology

## 2017-06-28 NOTE — Telephone Encounter (Signed)
Left detailed message on Jane's voicemail (ok per DPR) going over the options offered by Dr. Terrace ArabiaYan.  Provided our number to call back.

## 2017-07-12 ENCOUNTER — Encounter (INDEPENDENT_AMBULATORY_CARE_PROVIDER_SITE_OTHER): Payer: Self-pay | Admitting: Neurology

## 2017-07-12 ENCOUNTER — Ambulatory Visit (INDEPENDENT_AMBULATORY_CARE_PROVIDER_SITE_OTHER): Payer: Medicare Other | Admitting: Neurology

## 2017-07-12 DIAGNOSIS — R531 Weakness: Secondary | ICD-10-CM

## 2017-07-12 DIAGNOSIS — Z0289 Encounter for other administrative examinations: Secondary | ICD-10-CM

## 2017-07-12 DIAGNOSIS — R413 Other amnesia: Secondary | ICD-10-CM

## 2017-07-12 NOTE — Procedures (Signed)
Full Name: Bo Mcclintockmily Panameno Gender: Female MRN #: [id number] Date of Birth: [birth year]    Visit Date: 07/12/17 08:03 Age: [age] Years 7 Months Old Examining Physician: Levert FeinsteinYijun Orren Pietsch, MD  Referring Physician: Terrace ArabiaYan, MD History: 76 year old female, with acute worsening of weakness, gait abnormality now improved,  Summary of the test:  Nerve conduction study: Bilateral sural sensory responses were within normal limits. Bilateral superficial peroneal sensory responses were absent.  Bilateral tibial, left peroneal motor responses were normal. Right peroneal to EDB motor response showed severely decreased C map amplitude.  Right ulnar sensory and motor responses were normal.  Right median sensory responses showed mildly prolonged peak latency, with slightly decreased snap amplitude.  Right median motor responses showed mildly prolonged distal latency, with normal C map amplitude.  Electromyography:  Selective needle examinations were performed at bilateral lower extremity muscles and bilateral lumbar sacral paraspinal muscles.  There was no significant abnormality noted.    Conclusion: This is a mild abnormal study. There is electrodiagnostic evidence of right median neuropathy across the wrist consistent with moderate right carpal tunnel syndromes. There is no evidence of bilateral lumbar sacral radiculopathy or inflammatory myopathic changes.    ------------------------------- Levert FeinsteinYijun Oluwadarasimi Favor, M.D.  [facility] [address] [place], [place] [address] Tel: [phone] Fax: [phone]        [facility]    Nerve / Sites Muscle Latency Ref. Amplitude Ref. Rel Amp Segments Distance Velocity Ref. Area    ms ms mV mV %  cm m/s m/s mVms  R Median - APB     Wrist APB 4.7 ?4.4 5.5 ?4.0 100 Wrist - APB 7   17.3     Upper arm APB 8.8  4.5  82.2 Upper arm - Wrist 20 50 ?49 16.4  R Ulnar - ADM     Wrist ADM 2.7 ?3.3 7.3 ?6.0 100 Wrist - ADM 7   29.7     B.Elbow ADM 5.9  7.2   98.8 B.Elbow - Wrist 17 53 ?49 28.7     A.Elbow ADM 7.9  6.7  92.9 A.Elbow - B.Elbow 10 51 ?49 27.1         A.Elbow - Wrist      R Peroneal - EDB     Ankle EDB 5.9 ?6.5 0.6 ?2.0 100 Ankle - EDB 9   2.4     Fib head EDB 12.8  0.5  89.3 Fib head - Ankle 30 44 ?44 2.2     Pop fossa EDB 15.0  0.5  94.4 Pop fossa - Fib head 10 46 ?44 3.1         Pop fossa - Ankle      L Peroneal - EDB     Ankle EDB 6.0 ?6.5 2.7 ?2.0 100 Ankle - EDB 9   11.2     Fib head EDB 12.6  2.7  98 Fib head - Ankle 30 46 ?44 11.1     Pop fossa EDB 14.8  2.6  98.2 Pop fossa - Fib head 10 44 ?44 10.7         Pop fossa - Ankle      R Tibial - AH     Ankle AH 4.7 ?5.8 5.0 ?4.0 100 Ankle - AH 9   14.1     Pop fossa AH 13.1  4.5  88.8 Pop fossa - Ankle 36 43 ?41 13.9  L Tibial - AH     Ankle AH 4.2 ?5.8 6.0 ?  4.0 100 Ankle - AH 9   21.0     Pop fossa AH 12.8  4.5  74.9 Pop fossa - Ankle 36 42 ?41 14.9                 SNC    Nerve / Sites Rec. Site Peak Lat Ref.  Amp Ref. Segments Distance    ms ms V V  cm  R Sural - Ankle (Calf)     Calf Ankle 4.3 ?4.4 5 ?6 Calf - Ankle 14  L Sural - Ankle (Calf)     Calf Ankle 4.0 ?4.4 6 ?6 Calf - Ankle 14  L Superficial peroneal - Ankle     Lat leg Ankle NR ?4.4 NR ?6 Lat leg - Ankle 14  R Superficial peroneal - Ankle     Lat leg Ankle NR ?4.4 NR ?6 Lat leg - Ankle 14  R Median - Orthodromic (Dig II, Mid palm)     Dig II Wrist 3.9 ?3.4 9 ?10 Dig II - Wrist 13  R Ulnar - Orthodromic, (Dig V, Mid palm)     Dig V Wrist 3.0 ?3.1 6 ?5 Dig V - Wrist 7511                 F  Wave    Nerve F Lat Ref.   ms ms  R Ulnar - ADM 27.4 ?32.0  R Tibial - AH 55.6 ?56.0  L Tibial - AH 55.2 ?56.0           EMG full       EMG Summary Table    Spontaneous MUAP Recruitment  Muscle IA Fib PSW Fasc Other Amp Dur. Poly Pattern  R. Tibialis anterior Normal None None None _______ Normal Normal Normal Normal  R. Tibialis posterior Normal None None None _______ Normal Normal Normal Normal  R.  Gastrocnemius (Medial head) Normal None None None _______ Normal Normal Normal Normal  R. Vastus lateralis Normal None None None _______ Normal Normal Normal Normal  L. Tibialis anterior Normal None None None _______ Normal Normal Normal Normal  L. Tibialis posterior Normal None None None _______ Normal Normal Normal Normal  L. Gastrocnemius (Medial head) Normal None None None _______ Normal Normal Normal Normal  L. Vastus lateralis Normal None None None _______ Normal Normal Normal Normal  L. Lumbar paraspinals (mid) Normal None None None _______ Normal Normal Normal Normal  L. Lumbar paraspinals (low) Normal None None None _______ Normal Normal Normal Normal  R. Lumbar paraspinals (mid) Normal None None None _______ Normal Normal Normal Normal  R. Lumbar paraspinals (low) Normal None None None _______ Normal Normal Normal Normal

## 2017-07-24 ENCOUNTER — Ambulatory Visit: Payer: Medicare Other | Admitting: Neurology

## 2017-07-26 DIAGNOSIS — R410 Disorientation, unspecified: Secondary | ICD-10-CM | POA: Diagnosis not present

## 2017-07-26 DIAGNOSIS — E7800 Pure hypercholesterolemia, unspecified: Secondary | ICD-10-CM | POA: Diagnosis not present

## 2017-07-26 DIAGNOSIS — I10 Essential (primary) hypertension: Secondary | ICD-10-CM | POA: Diagnosis not present

## 2017-08-15 ENCOUNTER — Emergency Department (HOSPITAL_COMMUNITY)
Admission: EM | Admit: 2017-08-15 | Discharge: 2017-08-16 | Disposition: A | Discharge status: Home / Self Care | Payer: Medicare Other | Attending: Emergency Medicine | Admitting: Emergency Medicine

## 2017-08-15 ENCOUNTER — Encounter (HOSPITAL_COMMUNITY): Payer: Self-pay | Admitting: Emergency Medicine

## 2017-08-15 ENCOUNTER — Emergency Department (HOSPITAL_COMMUNITY): Payer: Medicare Other

## 2017-08-15 DIAGNOSIS — R519 Headache, unspecified: Secondary | ICD-10-CM

## 2017-08-15 DIAGNOSIS — Z79899 Other long term (current) drug therapy: Secondary | ICD-10-CM | POA: Diagnosis not present

## 2017-08-15 DIAGNOSIS — I10 Essential (primary) hypertension: Secondary | ICD-10-CM | POA: Diagnosis not present

## 2017-08-15 DIAGNOSIS — R51 Headache: Secondary | ICD-10-CM | POA: Diagnosis not present

## 2017-08-15 DIAGNOSIS — E785 Hyperlipidemia, unspecified: Secondary | ICD-10-CM | POA: Insufficient documentation

## 2017-08-15 MED ORDER — METOPROLOL TARTRATE 25 MG PO TABS
25.0000 mg | ORAL_TABLET | Freq: Once | ORAL | Status: AC
  Administered 2017-08-15: 25 mg via ORAL
  Filled 2017-08-15: qty 1

## 2017-08-15 MED ORDER — SODIUM CHLORIDE 0.9 % IV BOLUS (SEPSIS)
1000.0000 mL | INTRAVENOUS | Status: AC
  Administered 2017-08-15: 1000 mL via INTRAVENOUS

## 2017-08-15 MED ORDER — DIPHENHYDRAMINE HCL 50 MG/ML IJ SOLN
25.0000 mg | Freq: Once | INTRAMUSCULAR | Status: AC
  Administered 2017-08-15: 25 mg via INTRAVENOUS
  Filled 2017-08-15: qty 1

## 2017-08-15 MED ORDER — MAGNESIUM SULFATE 2 GM/50ML IV SOLN
2.0000 g | Freq: Once | INTRAVENOUS | Status: AC
  Administered 2017-08-15: 2 g via INTRAVENOUS
  Filled 2017-08-15: qty 50

## 2017-08-15 MED ORDER — PROCHLORPERAZINE EDISYLATE 5 MG/ML IJ SOLN
10.0000 mg | Freq: Once | INTRAMUSCULAR | Status: AC
  Administered 2017-08-15: 10 mg via INTRAVENOUS
  Filled 2017-08-15: qty 2

## 2017-08-15 NOTE — ED Notes (Signed)
Patient transported to CT 

## 2017-08-15 NOTE — ED Triage Notes (Signed)
Pt states she has had a headache for 3-4 weeks. Pt states she cannot sleep due to the pain. Pt has hx of migraine. Neuro intact. Alert and oriented x4

## 2017-08-15 NOTE — ED Notes (Signed)
Attempted PIVx2; Second RN to attempt. 

## 2017-08-15 NOTE — ED Notes (Signed)
ED Provider at bedside. 

## 2017-08-17 ENCOUNTER — Telehealth: Payer: Self-pay | Admitting: Neurology

## 2017-08-17 NOTE — Telephone Encounter (Signed)
Pt has agreed to 1st available appointment in Oct for Dr Terrace ArabiaYan but she would like to be seen asap due to head aches and not being able to sleep.  She would like RN to call her if there is a way for her to be seen earlier and if there is something that can be called in for her discomfort

## 2017-08-17 NOTE — ED Provider Notes (Signed)
[facility]Y DEPT Provider Note   CSN: [id number] Arrival date & time: 08/15/17  1342     History   Chief Complaint Chief Complaint  Patient presents with  . Headache    HPI Elizabeth [name] is a 76 y.o. female.  76yo F w/ PMH below who p/w headache. Pt reports 3-4 weeks of persistent headache that starts on the back of her head at her neck and moves forward. She has had problems sleeping because of the pain. She has had problems with similar headaches since a head injury last fall. She has tried OTC medications at home with no relief. She does follow with a neurologist. She denies any extremity weakness/numbness, fevers, or vomiting.   The history is provided by the patient.  Headache      Past Medical History:  Diagnosis Date  . Allergic rhinitis   . Anxiety   . Congenital defect    left arm  . Depression   . GERD (gastroesophageal reflux disease)   . Headache   . Hyperlipemia   . Hypertension   . IBS (irritable bowel syndrome)   . Insomnia   . Memory loss   . Vitamin D deficiency     Patient Active Problem List   Diagnosis Date Noted  . Encephalopathy 05/11/2017  . Orthostatic hypotension 05/01/2017  . Generalized weakness 04/30/2017  . Hypertension 04/30/2017  . Depression 04/30/2017  . Right arm pain 04/30/2017  . Anxiety 04/30/2017  . Passed out 02/06/2017  . Memory loss 02/06/2017    Past Surgical History:  Procedure Laterality Date  . ABDOMINAL HYSTERECTOMY    . CATARACT EXTRACTION Bilateral   . NECK SURGERY      OB History    No data available       Home Medications    Prior to Admission medications   Medication Sig Start Date End Date Taking? Authorizing Provider  metoprolol tartrate (LOPRESSOR) 25 MG tablet Take 25 mg by mouth 2 (two) times daily.    [provider]  polyethylene glycol (MIRALAX / GLYCOLAX) packet Take 17 g by mouth daily. 05/04/17   Joseph Art, DO  UNABLE TO FIND Med Pass: Drink 60 ml's (2 ounces)  by mouth three times a day    [provider]    Family History Family History  Problem Relation Age of Onset  . Adopted: Yes  . Breast cancer Mother   . Other Father        unsure of history    Social History Social History  Substance Use Topics  . Smoking status: Never Smoker  . Smokeless tobacco: Never Used  . Alcohol use No     Allergies   Norvasc [amlodipine]; Benicar [olmesartan]; and Lisinopril   Review of Systems Review of Systems  Neurological: Positive for headaches.    All other systems reviewed and are negative except that which was mentioned in HPI   Physical Exam Updated Vital Signs BP (!) 156/57 (BP Location: Right Arm)   Pulse 72   Temp 98.1 F (36.7 C) (Oral)   Resp 16   Ht  (1.6 m)   Wt 53.5 kg (118 lb)   SpO2 97%   BMI 20.90 kg/m   Physical Exam  Constitutional: She is oriented to person, place, and time. She appears well-developed and well-nourished. No distress.  Awake, alert  HENT:  Head: Normocephalic and atraumatic.  Mild tenderness at base of skull/top of neck without palpable deformity   Eyes: Pupils are  equal, round, and reactive to light. Conjunctivae and EOM are normal.  Neck: Neck supple.  Cardiovascular: Normal rate, regular rhythm and normal heart sounds.   No murmur heard. Pulmonary/Chest: Effort normal and breath sounds normal. No respiratory distress.  Abdominal: Soft. Bowel sounds are normal. She exhibits no distension. There is no tenderness.  Musculoskeletal: She exhibits no edema.  Neurological: She is alert and oriented to person, place, and time. She has normal reflexes. No cranial nerve deficit. She exhibits normal muscle tone.  Fluent speech, normal finger-to-nose testing, negative pronator drift, no clonus 5/5 strength and normal sensation x all 4 extremities  Skin: Skin is warm and dry.  Psychiatric: She has a normal mood and affect. Judgment and thought content normal.  Pressured speech    Nursing note and vitals reviewed.    ED Treatments / Results  Labs (all labs ordered are listed, but only abnormal results are displayed) Labs Reviewed - No data to display  EKG  EKG Interpretation None       Radiology Ct Head Wo Contrast  Result Date: 08/15/2017 CLINICAL DATA:  Headache, acute, severe, worst headache of life. EXAM: CT HEAD WITHOUT CONTRAST TECHNIQUE: Contiguous axial images were obtained from the base of the skull through the vertex without intravenous contrast. COMPARISON:  Head CT and brain MRI 05/11/2017 and 05/12/2017 respectively, additional prior head CTs. FINDINGS: Brain: Generalized atrophy, stable from prior. Chronic small vessel ischemia is unchanged. No intracranial hemorrhage, mass effect, or midline shift. No hydrocephalus. The basilar cisterns are patent. No evidence of territorial infarct or acute ischemia. No extra-axial or intracranial fluid collection. Vascular: Atherosclerosis of skullbase vasculature without hyperdense vessel or abnormal calcification. Skull: No focal lesion. Decreasing conspicuity of right occipital lucency since December 2017, likely healing nondisplaced skull fracture. Sinuses/Orbits: Paranasal sinuses and mastoid air cells are clear. The visualized orbits are unremarkable. Bilateral cataract resection Other: None. IMPRESSION: 1.  No acute intracranial abnormality. 2. Stable atrophy and chronic small vessel ischemia. 3. Probable healing nondisplaced right occipital skull fracture, less apparent than on prior exams. Electronically Signed   By: Rubye OaksMelanie  Ehinger M.D.   On: 08/15/2017 23:06    Procedures Procedures (including critical care time)  Medications Ordered in ED Medications  metoprolol tartrate (LOPRESSOR) tablet 25 mg (25 mg Oral Given 08/15/17 2214)  diphenhydrAMINE (BENADRYL) injection 25 mg (25 mg Intravenous Given 08/15/17 2215)  prochlorperazine (COMPAZINE) injection 10 mg (10 mg Intravenous Given 08/15/17 2217)  sodium  chloride 0.9 % bolus 1,000 mL (0 mLs Intravenous Stopped 08/15/17 2358)  magnesium sulfate IVPB 2 g 50 mL (0 g Intravenous Stopped 08/15/17 2341)     Initial Impression / Assessment and Plan / ED Course  I have reviewed the triage vital signs and the nursing notes.  Pertinent imaging results that were available during my care of the patient were reviewed by me and considered in my medical decision making (see chart for details).    Pt w/ several weeks of persistent headache involving neck. She was well appearing on exam, afebrile. Normal neuro exam. Because of her advanced age and persistence of headache, I did obtain CT head to r/o bleed or mass. CT negative acute but does show evidence of healing occipital skull fracture, likely from her previous head injury that she mentioned. During discussion, it sounds that her pain today is similar to previous problems, she just hasn't been able to get relief at home. Gave migraine cocktail as well as her home dose of metoprolol--she was hypertensive but  had not yet had evening dose. On reexamination, she stated her HA was much better and her BP had also improved. Instructed on supportive measures and f/u with her neurologist. Reviewed return precautions. She and family member voiced understanding and she was discharged in satisfactory condition.   Final Clinical Impressions(s) / ED Diagnoses   Final diagnoses:  Recurrent occipital headache    New Prescriptions Discharge Medication List as of 08/15/2017 11:39 PM       Perina Salvaggio, Ambrose Finland, MD 08/17/17 2236

## 2017-08-17 NOTE — Telephone Encounter (Signed)
I have returned the call to the patient and offered her an earlier appt.  She will come in on 08/31/17 for further evaluation.

## 2017-08-31 ENCOUNTER — Encounter: Payer: Self-pay | Admitting: Neurology

## 2017-08-31 ENCOUNTER — Other Ambulatory Visit: Payer: Self-pay | Admitting: *Deleted

## 2017-08-31 ENCOUNTER — Ambulatory Visit (INDEPENDENT_AMBULATORY_CARE_PROVIDER_SITE_OTHER): Payer: Medicare Other | Admitting: Neurology

## 2017-08-31 ENCOUNTER — Telehealth: Payer: Self-pay | Admitting: Neurology

## 2017-08-31 VITALS — BP 159/75 | HR 82 | Ht 63.0 in | Wt 121.0 lb

## 2017-08-31 DIAGNOSIS — F419 Anxiety disorder, unspecified: Secondary | ICD-10-CM | POA: Diagnosis not present

## 2017-08-31 DIAGNOSIS — R531 Weakness: Secondary | ICD-10-CM | POA: Diagnosis not present

## 2017-08-31 DIAGNOSIS — R413 Other amnesia: Secondary | ICD-10-CM

## 2017-08-31 DIAGNOSIS — M542 Cervicalgia: Secondary | ICD-10-CM | POA: Diagnosis not present

## 2017-08-31 MED ORDER — CYCLOBENZAPRINE HCL 5 MG PO TABS: 5.0000 mg | ORAL_TABLET | Freq: Three times a day (TID) | ORAL | 6 refills | Status: DC | PRN

## 2017-08-31 MED ORDER — DULOXETINE HCL 60 MG PO CPEP: 60.0000 mg | ORAL_CAPSULE | Freq: Every day | ORAL | 12 refills | Status: DC

## 2017-08-31 MED ORDER — ALPRAZOLAM 1 MG PO TABS: ORAL_TABLET | ORAL | 0 refills | Status: DC

## 2017-08-31 MED ORDER — ALPRAZOLAM 1 MG PO TABS: 1.0000 mg | ORAL_TABLET | Freq: Every evening | ORAL | 0 refills | Status: DC | PRN

## 2017-08-31 NOTE — Addendum Note (Signed)
Addended by: Tamera Stands D on: 08/31/2017 10:45 AM   Modules accepted: Orders
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
## 2017-08-31 NOTE — Telephone Encounter (Signed)
Kendy MRI of the cervical spine was ordered previously, please schedule for open MRI,

## 2017-08-31 NOTE — Telephone Encounter (Signed)
Patient is scheduled to have her MRI cervical spine done at [facility] arrival time is 1:15 pm. Patient and her daughter are aware of the time and day.

## 2017-09-01 ENCOUNTER — Telehealth: Payer: Self-pay | Admitting: Neurology

## 2017-09-01 ENCOUNTER — Encounter: Payer: Self-pay | Admitting: Neurology

## 2017-09-01 DIAGNOSIS — M47812 Spondylosis without myelopathy or radiculopathy, cervical region: Secondary | ICD-10-CM | POA: Diagnosis not present

## 2017-09-01 DIAGNOSIS — Z981 Arthrodesis status: Secondary | ICD-10-CM | POA: Diagnosis not present

## 2017-09-01 DIAGNOSIS — M47811 Spondylosis without myelopathy or radiculopathy, occipito-atlanto-axial region: Secondary | ICD-10-CM | POA: Diagnosis not present

## 2017-09-01 DIAGNOSIS — R899 Unspecified abnormal finding in specimens from other organs, systems and tissues: Secondary | ICD-10-CM

## 2017-09-01 DIAGNOSIS — M5031 Other cervical disc degeneration,  high cervical region: Secondary | ICD-10-CM | POA: Diagnosis not present

## 2017-09-01 LAB — ENA+DNA/DS+SJORGEN'S
ENA RNP Ab: <0.2 AI (ref 0.0–0.9)
ENA SM Ab Ser-aCnc: <0.2 AI (ref 0.0–0.9)
ENA SSA (RO) Ab: <0.2 AI (ref 0.0–0.9)
ENA SSB (LA) Ab: <0.2 AI (ref 0.0–0.9)
dsDNA Ab: 12 IU/mL — ABNORMAL HIGH (ref 0–9)

## 2017-09-01 LAB — THYROID PANEL WITH TSH
Free Thyroxine Index: 2 (ref 1.2–4.9)
T3 Uptake Ratio: 26 % (ref 24–39)
T4, Total: 7.8 ug/dL (ref 4.5–12.0)
TSH: 2.64 u[IU]/mL (ref 0.450–4.500)

## 2017-09-01 LAB — SEDIMENTATION RATE: Sed Rate: 3 mm/hr (ref 0–40)

## 2017-09-01 LAB — C-REACTIVE PROTEIN: CRP: 1.7 mg/L (ref 0.0–4.9)

## 2017-09-01 LAB — VITAMIN B12: Vitamin B-12: 504 pg/mL (ref 232–1245)

## 2017-09-01 LAB — ANA W/REFLEX: Anti Nuclear Antibody(ANA): POSITIVE — AB

## 2017-09-01 LAB — CK: Total CK: 47 U/L (ref 24–173)

## 2017-09-01 NOTE — Telephone Encounter (Signed)
Please call patient laboratory evaluation showed positive ANA, double-stranded DNA,  I have ordered repeat laboratory evaluation, she can come in without appointment

## 2017-09-04 NOTE — Telephone Encounter (Signed)
Spoke to her daughter, Thurston Hole (on HIPAA) - she is aware of results and will bring her mother in for repeat labs.

## 2017-09-05 ENCOUNTER — Telehealth: Payer: Self-pay | Admitting: *Deleted

## 2017-09-05 ENCOUNTER — Other Ambulatory Visit (INDEPENDENT_AMBULATORY_CARE_PROVIDER_SITE_OTHER): Payer: Self-pay

## 2017-09-05 DIAGNOSIS — R899 Unspecified abnormal finding in specimens from other organs, systems and tissues: Secondary | ICD-10-CM | POA: Diagnosis not present

## 2017-09-05 DIAGNOSIS — Z0289 Encounter for other administrative examinations: Secondary | ICD-10-CM

## 2017-09-05 NOTE — Telephone Encounter (Signed)
Patient in lobby - reports that she tood the Tramadol and it made her pass out.  She was dizzy and hit the floor.  Does not want to take this medication anymore.  Is there anything else she could take? Please call.

## 2017-09-05 NOTE — Telephone Encounter (Signed)
Dr. Terrace Arabia does not prescribe Tramadol for the patient.  I was unable to reach patient by phone.  I called her daughter, Erskine Squibb (on HIPAA) - she is going to speak with her mother to get clarification on the medication.  She will call back if help is still needed.

## 2017-09-06 ENCOUNTER — Other Ambulatory Visit: Payer: Self-pay | Admitting: *Deleted

## 2017-09-06 DIAGNOSIS — R768 Other specified abnormal immunological findings in serum: Secondary | ICD-10-CM

## 2017-09-06 LAB — ENA+DNA/DS+SJORGEN'S
ENA RNP Ab: <0.2 AI (ref 0.0–0.9)
ENA SM Ab Ser-aCnc: <0.2 AI (ref 0.0–0.9)
ENA SSA (RO) Ab: <0.2 AI (ref 0.0–0.9)
ENA SSB (LA) Ab: <0.2 AI (ref 0.0–0.9)
dsDNA Ab: 11 IU/mL — ABNORMAL HIGH (ref 0–9)

## 2017-09-06 LAB — ANA W/REFLEX: Anti Nuclear Antibody(ANA): POSITIVE — AB

## 2017-09-06 LAB — C3 COMPLEMENT: Complement C3, Serum: 150 mg/dL (ref 82–167)

## 2017-09-06 LAB — C4 COMPLEMENT: Complement C4, Serum: 31 mg/dL (ref 14–44)

## 2017-09-06 NOTE — Telephone Encounter (Signed)
Please call patient, laboratory evaluation continues show evidence of positive ANA, double-stranded DNA, I will refer her to rheumatologist for evaluation

## 2017-09-06 NOTE — Telephone Encounter (Addendum)
Spoke to patient - she is aware of results and agreeable to the referral.  She is aware to expect a call to schedule an appt.

## 2017-09-22 ENCOUNTER — Ambulatory Visit
Admission: RE | Admit: 2017-09-22 | Discharge: 2017-09-22 | Disposition: A | Discharge status: Home / Self Care | Payer: Medicare Other | Source: Ambulatory Visit | Attending: Family Medicine | Admitting: Family Medicine

## 2017-09-22 DIAGNOSIS — Z1231 Encounter for screening mammogram for malignant neoplasm of breast: Secondary | ICD-10-CM

## 2017-09-28 ENCOUNTER — Ambulatory Visit: Payer: Medicare Other | Admitting: Neurology

## 2017-09-29 ENCOUNTER — Telehealth: Payer: Self-pay | Admitting: Neurology

## 2017-09-29 NOTE — Telephone Encounter (Signed)
Spoke to patient she is aware of results

## 2017-09-29 NOTE — Telephone Encounter (Signed)
Please call patient, MRI of cervical spine showed evidence of previous anterior fusion C5-6, C6-7, mild to moderate spondylosis and facet joint arthritis from C2-C5, no evidence of nerve root or spinal cord compression

## 2017-11-01 DIAGNOSIS — R413 Other amnesia: Secondary | ICD-10-CM | POA: Diagnosis not present

## 2017-11-01 DIAGNOSIS — Z6821 Body mass index (BMI) 21.0-21.9, adult: Secondary | ICD-10-CM | POA: Diagnosis not present

## 2017-11-01 DIAGNOSIS — R768 Other specified abnormal immunological findings in serum: Secondary | ICD-10-CM | POA: Diagnosis not present

## 2017-11-04 IMAGING — MR MR HEAD W/O CM
7 series · 48 of 48 positions shown · non-contrast
Comparison: Prior CT from earlier the same day as well as prior MRI
from 04/18/2017.

CLINICAL DATA: Initial evaluation for acute altered mental status.

EXAM:
MRI HEAD WITHOUT CONTRAST
TECHNIQUE: Multiplanar, multiecho pulse sequences of the brain and surrounding
structures were obtained without intravenous contrast.

[Series 4: DWI · axial · 3.0mm · 0.94mm/px · z∈[-84,+54]mm · 10 of 96 slices shown (1 of 3)]
[im 1/96]
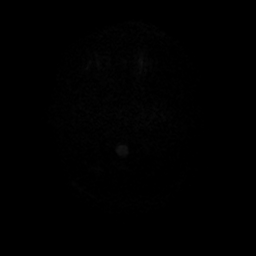
[im 11/96]
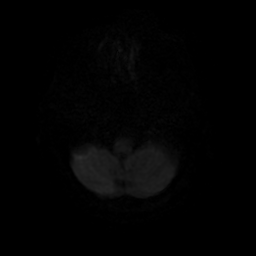
[im 22/96]
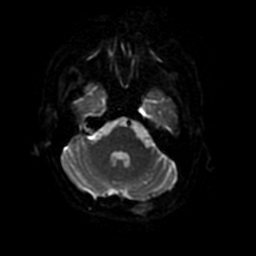
[im 32/96]
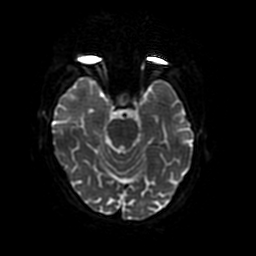
[im 43/96]
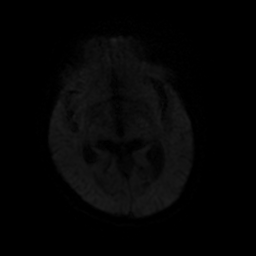
[im 53/96]
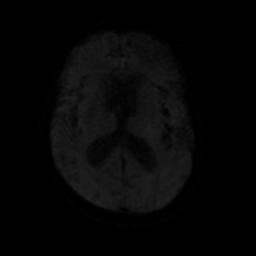
[im 64/96]
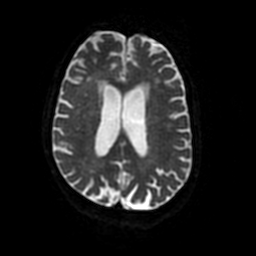
[im 74/96]
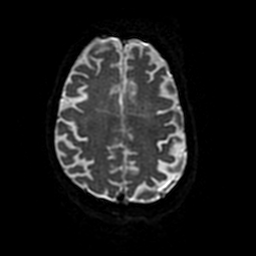
[im 85/96]
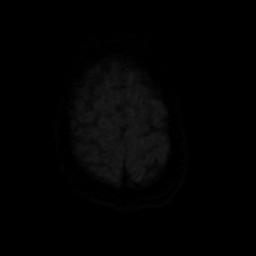
[im 96/96]
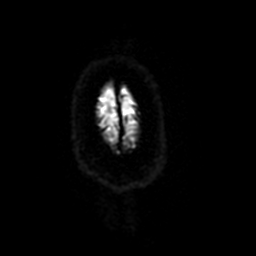

[Series 8: DWI · axial · 3.0mm · 0.94mm/px · z∈[-79,+59]mm · 11 of 96 slices shown (2 of 3)]
[im 1/96]
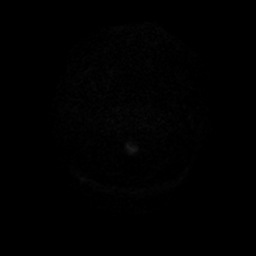
[im 10/96]
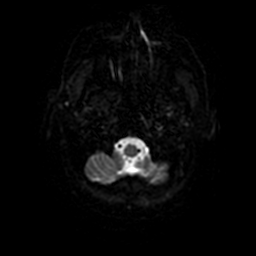
[im 20/96]
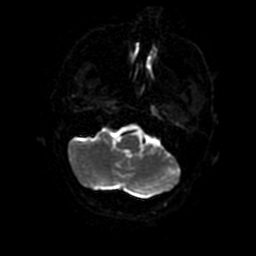
[im 29/96]
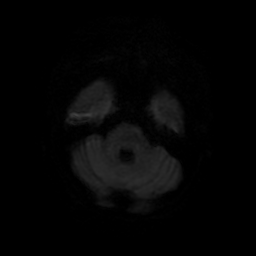
[im 39/96]
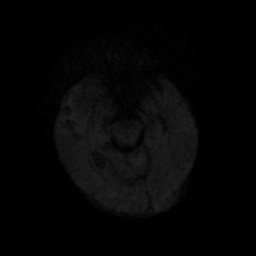
[im 48/96]
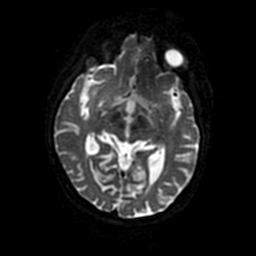
[im 58/96]
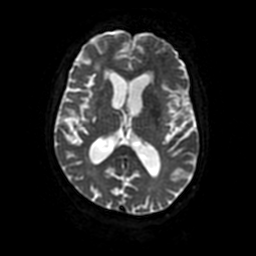
[im 67/96]
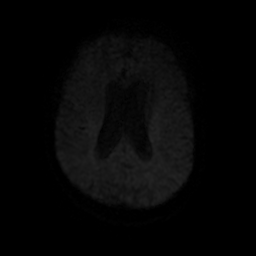
[im 77/96]
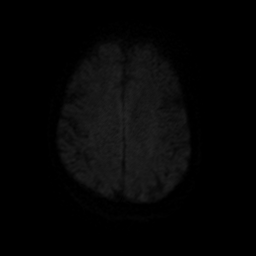
[im 86/96]
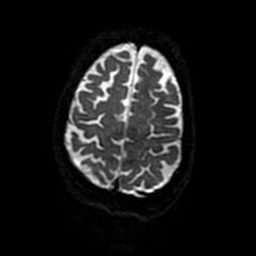
[im 96/96]
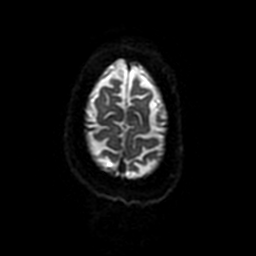

[Series 9: T2 · axial · 5.0mm · 0.47mm/px · z∈[-77,+58]mm · 3 of 24 slices shown]
[im 1/24]
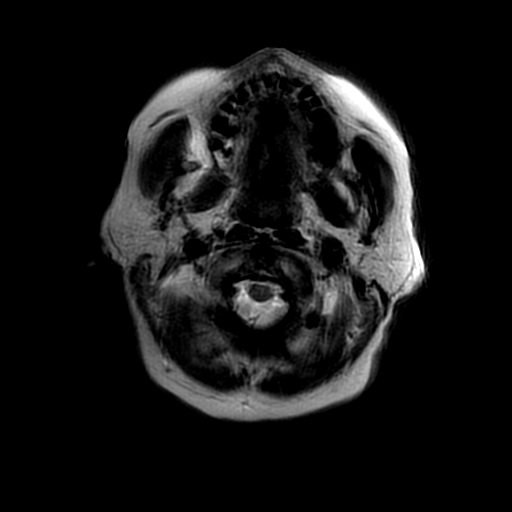
[im 12/24]
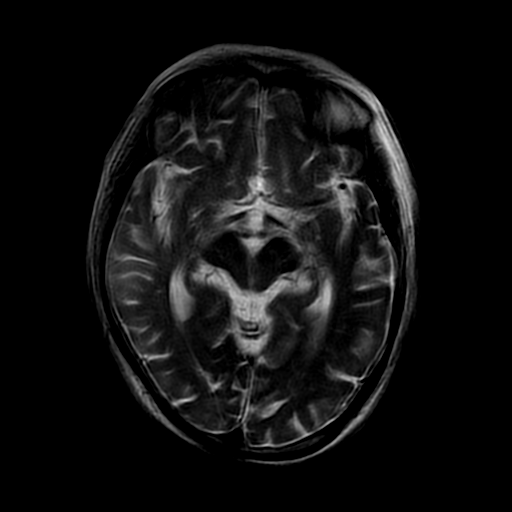
[im 24/24]
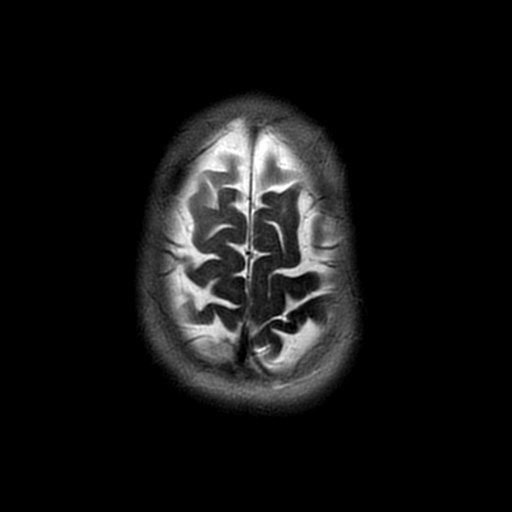

[Series 10: DWI · coronal · 4.0mm · 0.94mm/px · 8 of 68 slices shown (3 of 3)]
[im 1/68]
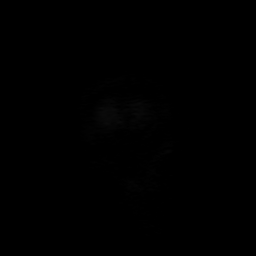
[im 10/68]
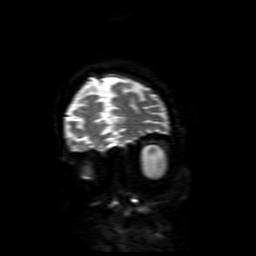
[im 20/68]
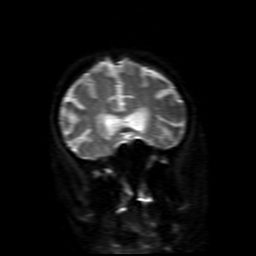
[im 29/68]
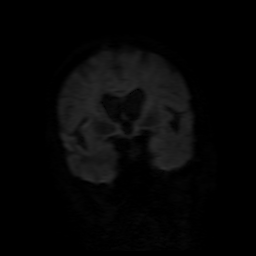
[im 39/68]
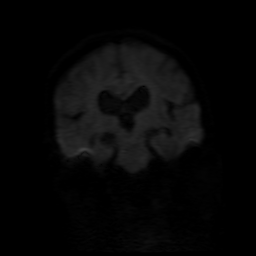
[im 48/68]
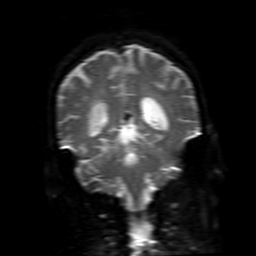
[im 58/68]
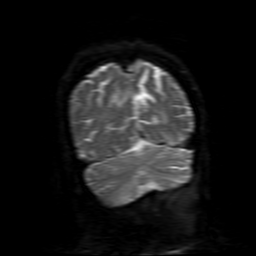
[im 68/68]
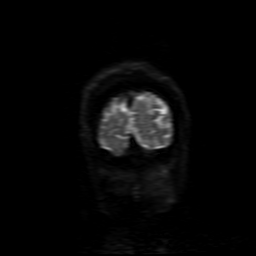

[Series 450: ADC · axial · 3.0mm · 0.94mm/px · z∈[-84,+54]mm · 6 of 48 slices shown (1 of 3)]
[im 1/48]
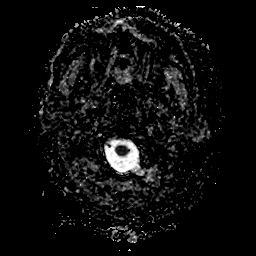
[im 10/48]
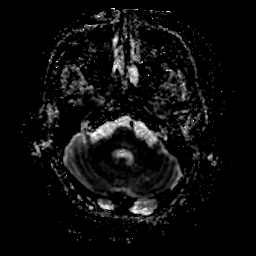
[im 19/48]
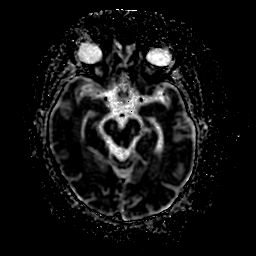
[im 29/48]
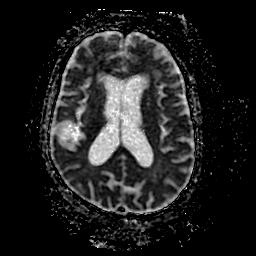
[im 38/48]
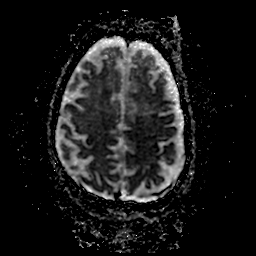
[im 48/48]
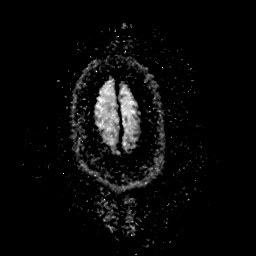

[Series 850: ADC · axial · 3.0mm · 0.94mm/px · z∈[-79,+59]mm · 6 of 48 slices shown (2 of 3)]
[im 1/48]
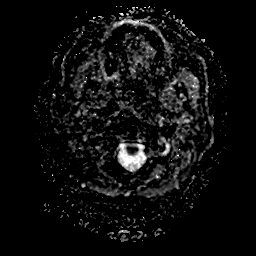
[im 10/48]
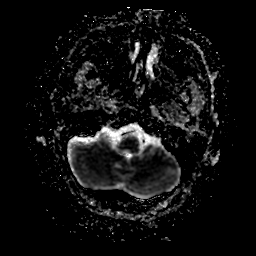
[im 19/48]
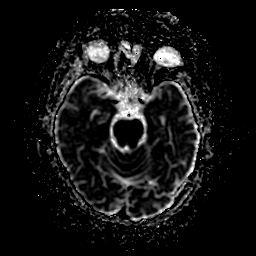
[im 29/48]
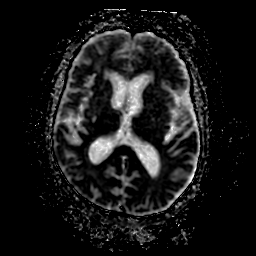
[im 38/48]
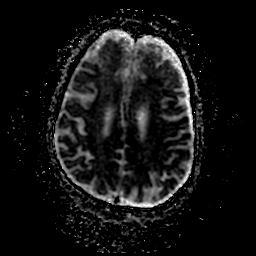
[im 48/48]
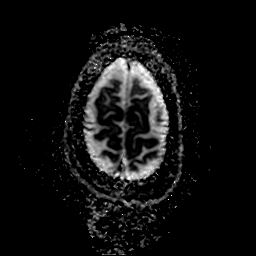

[Series 1050: ADC · coronal · 4.0mm · 0.94mm/px · 4 of 34 slices shown (3 of 3)]
[im 1/34]
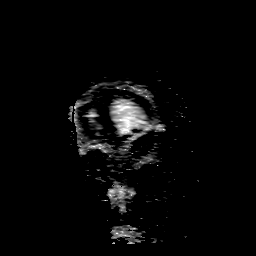
[im 12/34]
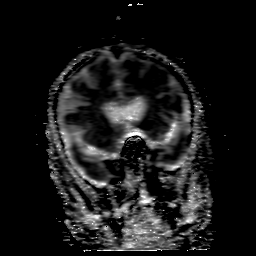
[im 23/34]
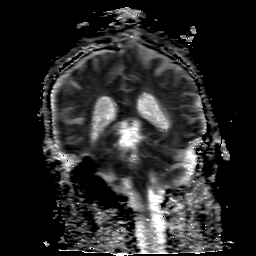
[im 34/34]
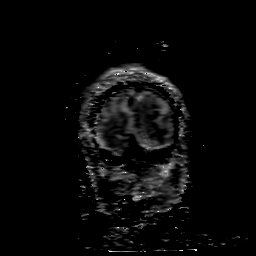

[48 of 48 positions shown; findings below may reference images not displayed]

FINDINGS: Study is markedly limited due to extensive motion artifact and
patient's inability to tolerate the full length of the exam.
Diffusion-weighted imaging with axial tooth T2 weighted sequence was
performed.

Motion degraded diffusion sequence demonstrates no evidence for
acute or subacute ischemia.

Cerebral atrophy with chronic small vessel ischemic disease again
noted. Probable tiny remote bilateral cerebellar infarcts and remote
right basal ganglia lacunar infarct. No mass lesion, mass effect, or
midline shift identified on this limited exam. Mild ventricular
prominence related atrophy without hydrocephalus. No extra-axial
fluid collection.

Major intracranial vascular flow voids are grossly maintained.

Globes and orbital soft tissues grossly unremarkable. Patient status
post lens extraction bilaterally. Mild mucosal thickening within the
right maxillary sinus. Paranasal sinuses are otherwise clear. No
mastoid effusion.

Scalp soft tissues and calvarium grossly unremarkable.
IMPRESSION: 1. Severely limited study due to extensive motion artifact and
patient's inability to tolerate the full length of the exam.
2. No definite acute intracranial process identified.
3. Stable atrophy with chronic small vessel ischemic disease.

## 2017-11-04 IMAGING — RF DG FLUORO GUIDE LUMBAR PUNCTURE
5 series · 5 of 5 positions shown · non-contrast
Comparison: none

CLINICAL DATA: Mental status changes.  Encephalopathy.

[Series 1: run · 1 of 1 slices shown (1 of 5)]
[im 1/1]
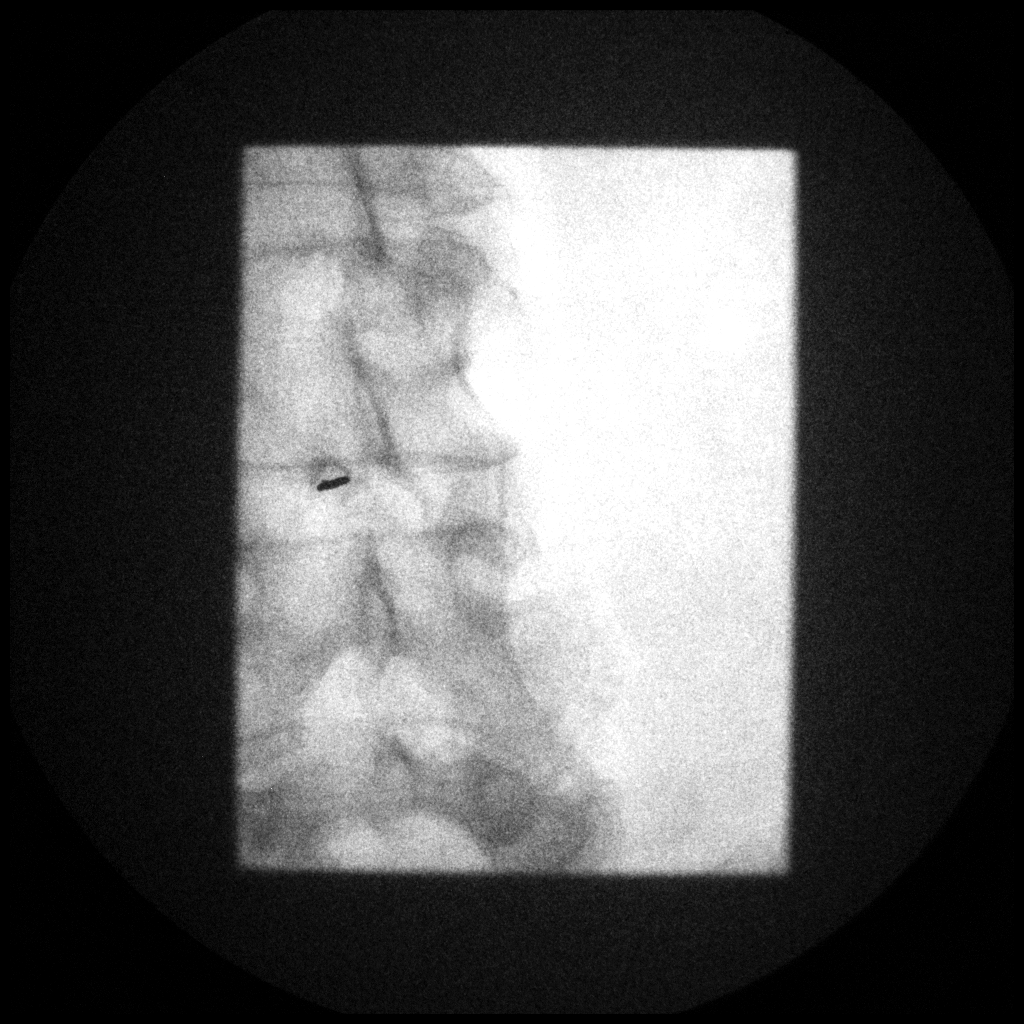

[Series 2: run · 1 of 1 slices shown (2 of 5)]
[im 1/1]
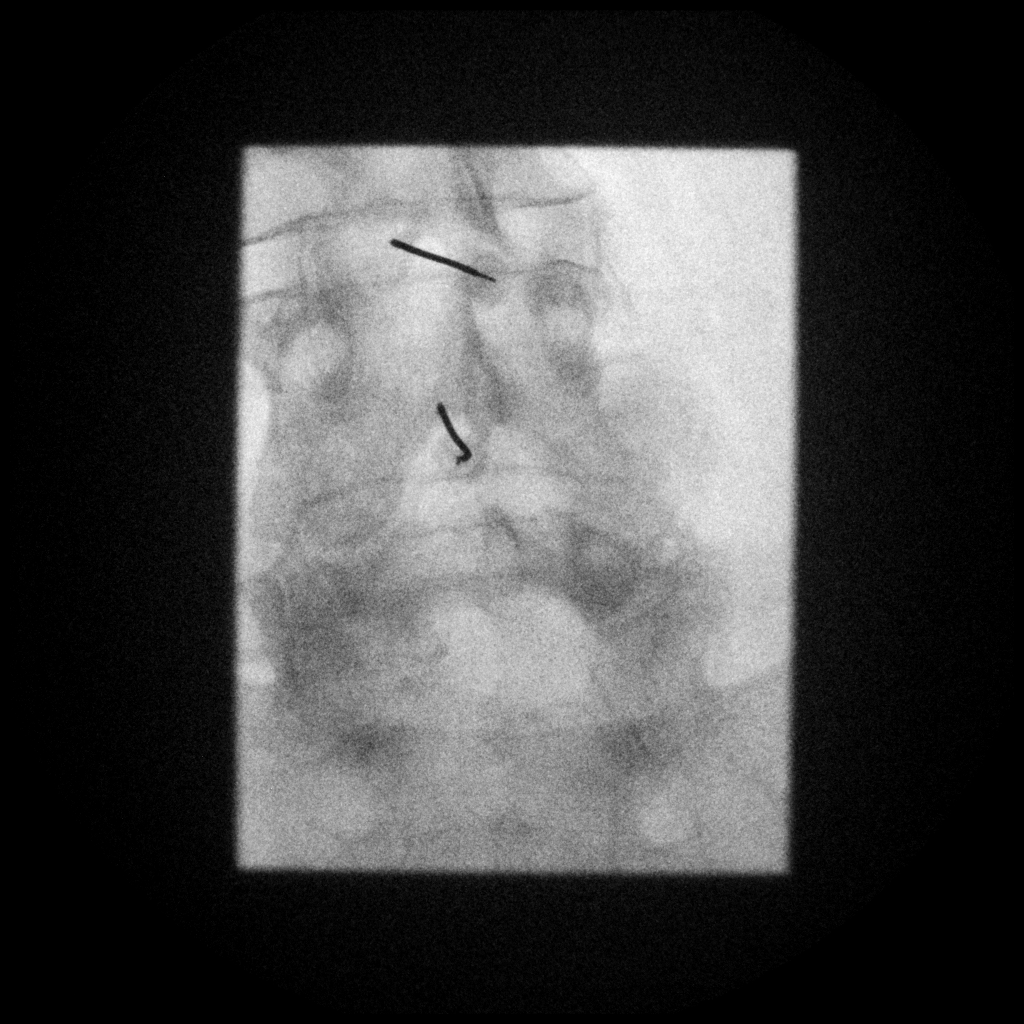

[Series 3: run · 1 of 1 slices shown (3 of 5)]
[im 1/1]
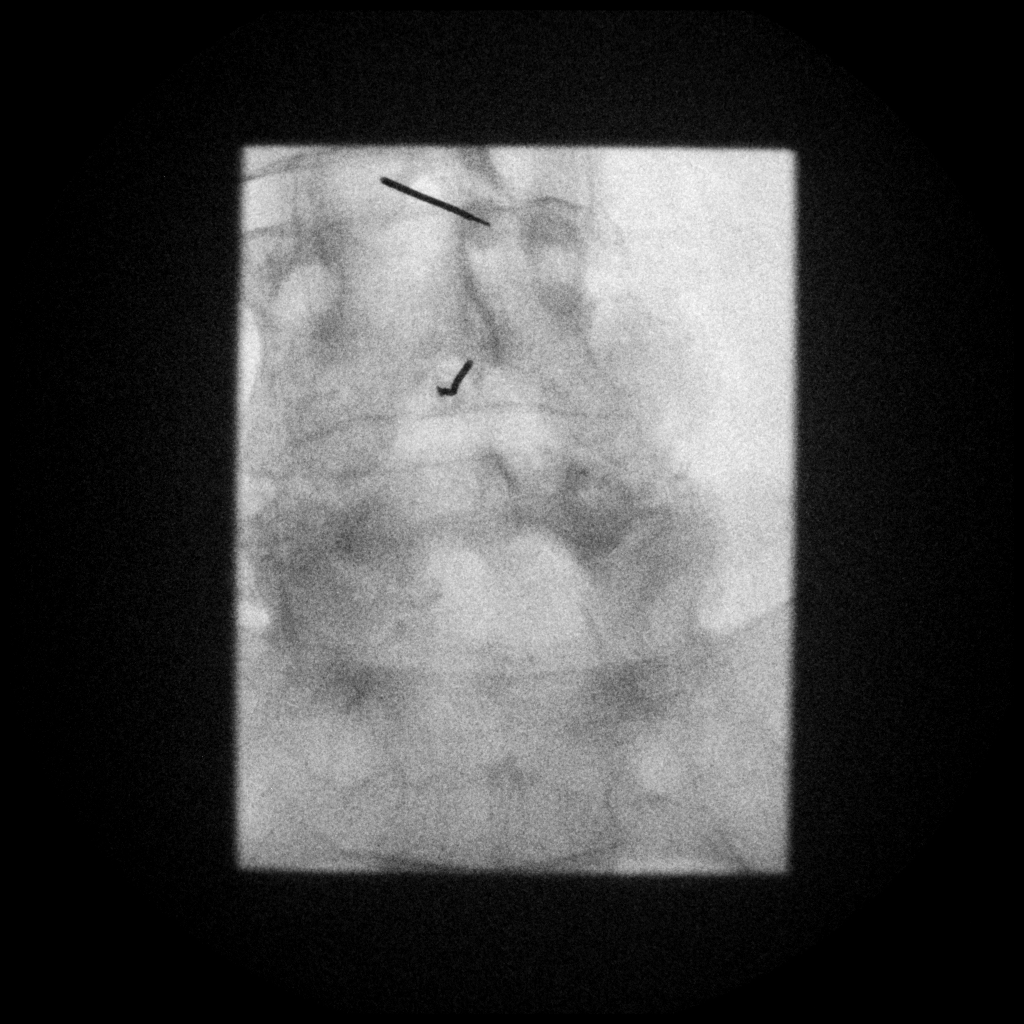

[Series 4: run · 1 of 1 slices shown (4 of 5)]
[im 1/1]
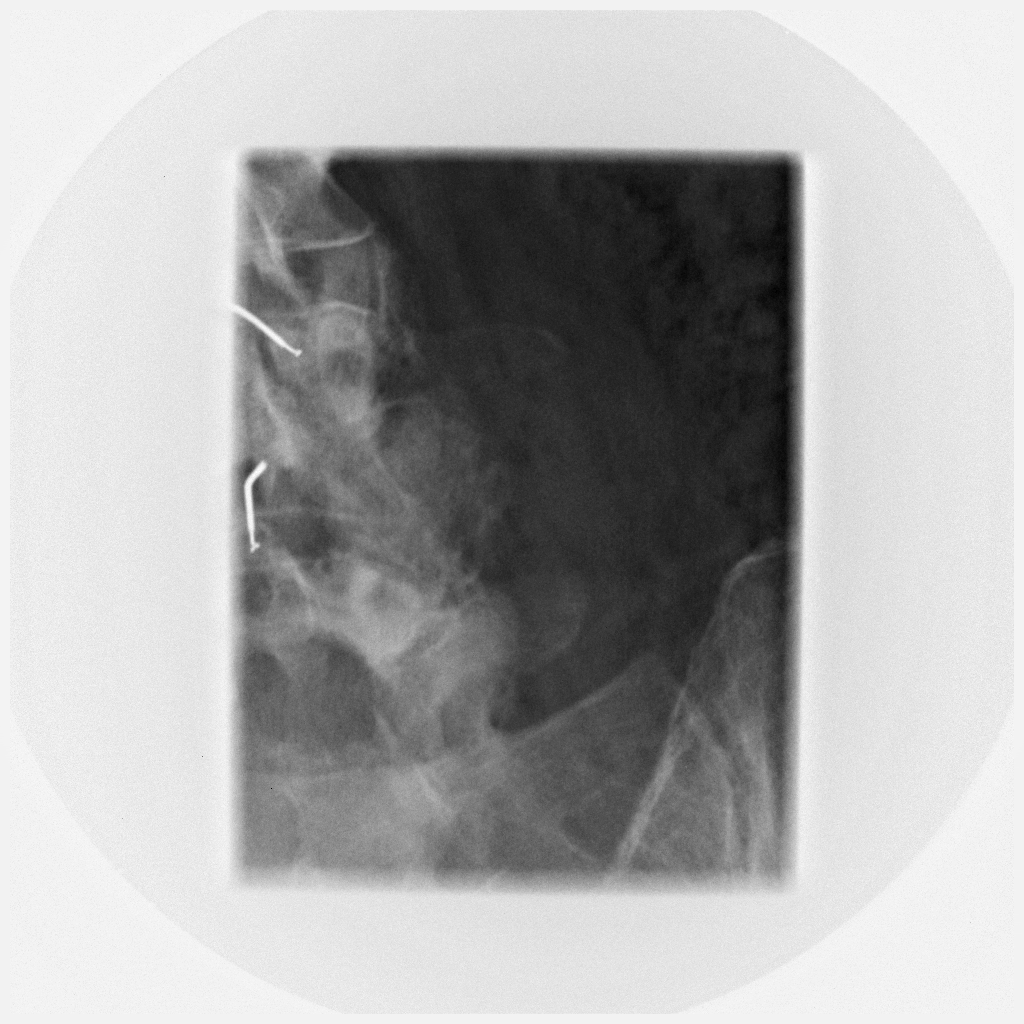

[Series 5: run · 1 of 1 slices shown (5 of 5)]
[im 1/1]
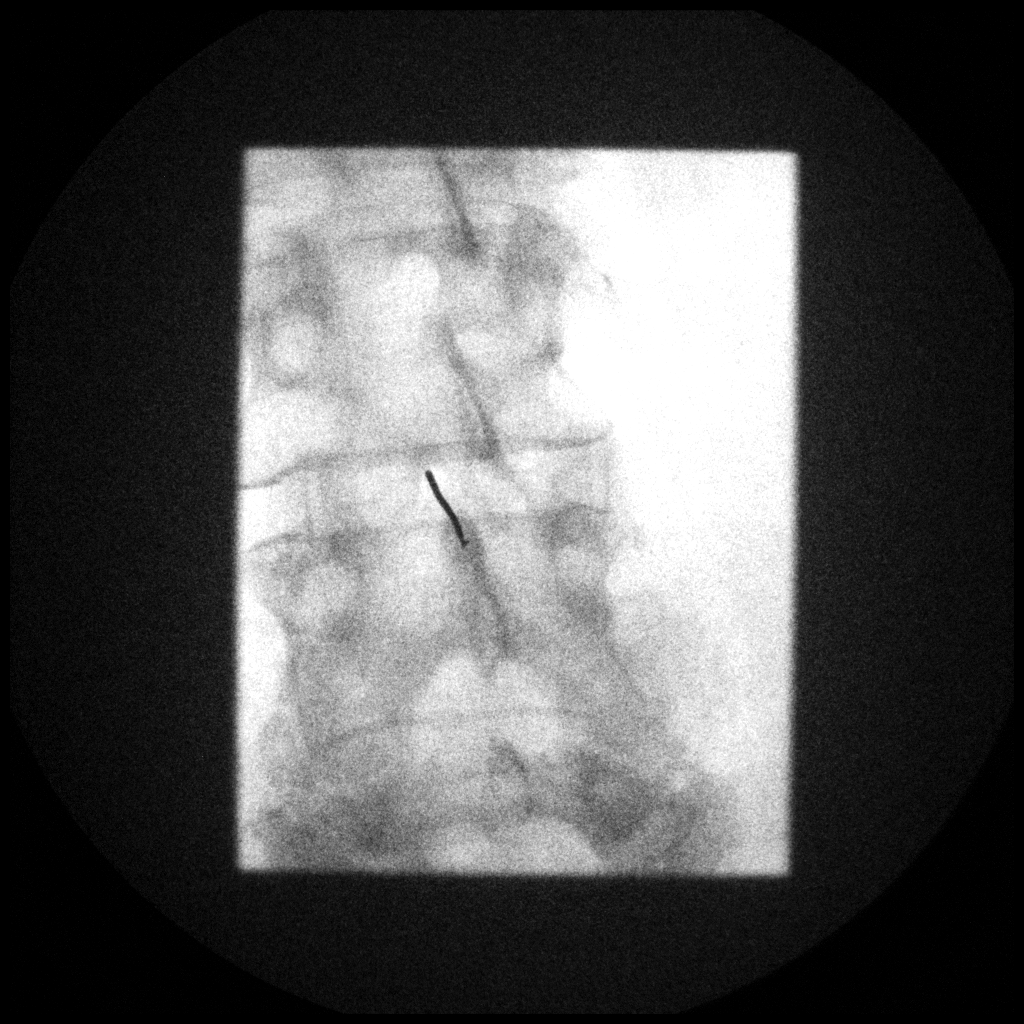

[5 of 5 positions shown; findings below may reference images not displayed]

EXAM:
DIAGNOSTIC LUMBAR PUNCTURE UNDER FLUOROSCOPIC GUIDANCE

FLUOROSCOPY TIME:  Fluoroscopy Time:  4 minutes and 50 seconds

Radiation Exposure Index (if provided by the fluoroscopic device):
Not applicable.

Number of Acquired Spot Images: 1

PROCEDURE:
Informed consent was obtained from the patient prior to the
procedure, including potential complications of headache, allergy,
and pain. With the patient prone, the lower back was prepped with
Betadine. 1% Lidocaine was used for local anesthesia. Lumbar
puncture was performed at the L3-4 level using a 20 gauge needle
with return of pink CSF. Opening pressure could not be measured
secondary to patient immobility and relatively low qualitative
pressure. 8 ml of CSF were obtained for laboratory studies. The
patient tolerated the procedure well and there were no apparent
complications.
IMPRESSION: Non complicated lumbar puncture as detailed above.

## 2017-12-22 ENCOUNTER — Other Ambulatory Visit: Payer: Self-pay | Admitting: Neurology

## 2018-01-04 ENCOUNTER — Telehealth: Payer: Self-pay | Admitting: Neurology

## 2018-01-04 MED ORDER — CYCLOBENZAPRINE HCL 5 MG PO TABS: 5.0000 mg | ORAL_TABLET | Freq: Three times a day (TID) | ORAL | 6 refills | Status: DC | PRN

## 2018-01-04 NOTE — Telephone Encounter (Signed)
Pt's daughter called, she is out of cyclobenzaprine (FLEXERIL) 5 MG tablet due to qty. Pt is taking 2 pills/day and occassionally 3 pills/day. Please resend with correct dosing to [facility].

## 2018-01-04 NOTE — Telephone Encounter (Signed)
Patient's prescription was written to take TID PRN.  New rx sent to the requested pharmacy.

## 2018-01-09 ENCOUNTER — Ambulatory Visit (INDEPENDENT_AMBULATORY_CARE_PROVIDER_SITE_OTHER): Payer: Medicare Other | Admitting: Adult Health

## 2018-01-09 ENCOUNTER — Encounter: Payer: Self-pay | Admitting: Adult Health

## 2018-01-09 VITALS — BP 160/84 | HR 84 | Wt 115.4 lb

## 2018-01-09 DIAGNOSIS — M542 Cervicalgia: Secondary | ICD-10-CM | POA: Diagnosis not present

## 2018-01-09 NOTE — Patient Instructions (Signed)
Your Plan:  Continue Flexeril and Cymbalta If your symptoms worsen or you develop new symptoms please let us know.   [facility] you [facility]. I hope we have been able to provide you high quality care today.  You may receive a patient satisfaction survey over the next few weeks. We would appreciate your feedback and comments so that we may continue to improve ourselves and the health of our patients.
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
## 2018-01-11 NOTE — Progress Notes (Signed)
I have reviewed and agreed above plan. 

## 2018-01-15 ENCOUNTER — Ambulatory Visit: Payer: Medicare Other | Admitting: Neurology

## 2018-01-29 DIAGNOSIS — F341 Dysthymic disorder: Secondary | ICD-10-CM | POA: Diagnosis not present

## 2018-01-29 DIAGNOSIS — I10 Essential (primary) hypertension: Secondary | ICD-10-CM | POA: Diagnosis not present

## 2018-01-29 DIAGNOSIS — Z23 Encounter for immunization: Secondary | ICD-10-CM | POA: Diagnosis not present

## 2018-03-06 ENCOUNTER — Encounter: Payer: Self-pay | Admitting: Adult Health

## 2018-06-25 DIAGNOSIS — Z1211 Encounter for screening for malignant neoplasm of colon: Secondary | ICD-10-CM | POA: Diagnosis not present

## 2018-06-25 DIAGNOSIS — I10 Essential (primary) hypertension: Secondary | ICD-10-CM | POA: Diagnosis not present

## 2018-06-25 DIAGNOSIS — M79603 Pain in arm, unspecified: Secondary | ICD-10-CM | POA: Diagnosis not present

## 2018-06-25 DIAGNOSIS — F341 Dysthymic disorder: Secondary | ICD-10-CM | POA: Diagnosis not present

## 2018-06-25 DIAGNOSIS — R1310 Dysphagia, unspecified: Secondary | ICD-10-CM | POA: Diagnosis not present

## 2018-06-25 DIAGNOSIS — E7800 Pure hypercholesterolemia, unspecified: Secondary | ICD-10-CM | POA: Diagnosis not present

## 2018-06-25 DIAGNOSIS — Z23 Encounter for immunization: Secondary | ICD-10-CM | POA: Diagnosis not present

## 2018-06-25 DIAGNOSIS — Z0000 Encounter for general adult medical examination without abnormal findings: Secondary | ICD-10-CM | POA: Diagnosis not present

## 2018-07-10 ENCOUNTER — Ambulatory Visit: Payer: Medicare Other | Admitting: Adult Health

## 2018-09-22 ENCOUNTER — Other Ambulatory Visit: Payer: Self-pay | Admitting: Neurology

## 2018-10-17 ENCOUNTER — Telehealth: Payer: Self-pay | Admitting: Adult Health

## 2018-10-17 NOTE — Telephone Encounter (Signed)
Pt last saw NP Aundra Millet and was to f/u  07-09-2018.  Pt states she has had a lot going on as to why she has not called.  Pt is requesting a refill on her cyclobenzaprine (FLEXERIL) 5 MG tablet and DULoxetine (CYMBALTA) 60 MG capsule.  She has been told she has to be seen 1st.  Appointment scheduled with NP Shanda Bumps, please call if unable to see pt

## 2018-10-17 NOTE — Telephone Encounter (Signed)
Appt schedule with Shanda Bumps NP on 10/19/2018. Pt wants refill. Message sent to [facility] NP to see if she will refill.

## 2018-10-17 NOTE — Telephone Encounter (Signed)
Cymbalta was just refilled on 09/24/18 By Dr. Terrace Arabia? The flexeril can be refilled Friday when seen. Refills for cymbalta can be given then.

## 2018-10-19 ENCOUNTER — Encounter: Payer: Self-pay | Admitting: Adult Health

## 2018-10-19 ENCOUNTER — Ambulatory Visit (INDEPENDENT_AMBULATORY_CARE_PROVIDER_SITE_OTHER): Payer: Medicare Other | Admitting: Adult Health

## 2018-10-19 VITALS — BP 171/71 | HR 66 | Ht 63.0 in | Wt 121.8 lb

## 2018-10-19 DIAGNOSIS — F419 Anxiety disorder, unspecified: Secondary | ICD-10-CM

## 2018-10-19 DIAGNOSIS — M542 Cervicalgia: Secondary | ICD-10-CM

## 2018-10-19 MED ORDER — CYCLOBENZAPRINE HCL 5 MG PO TABS: 5.0000 mg | ORAL_TABLET | Freq: Three times a day (TID) | ORAL | 3 refills | Status: DC | PRN

## 2018-10-19 MED ORDER — DULOXETINE HCL 60 MG PO CPEP: 60.0000 mg | ORAL_CAPSULE | Freq: Every day | ORAL | 3 refills | Status: DC

## 2018-10-19 NOTE — Patient Instructions (Signed)
Your Plan:  Continue flexeril 5mg  3 times daily as needed Continue cymbalta 60mg  daily   Follow up in 6 months or call earlier if needed       Thank you for coming to see [place] at [facility]. I hope we have been able to provide you high quality care today.  You may receive a patient satisfaction survey over the next few weeks. We would appreciate your feedback and comments so that we may continue to improve ourselves and the health of our patients.
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
## 2018-10-24 NOTE — Progress Notes (Signed)
I have reviewed and agreed above plan. 

## 2018-12-26 DIAGNOSIS — F341 Dysthymic disorder: Secondary | ICD-10-CM | POA: Diagnosis not present

## 2018-12-26 DIAGNOSIS — E7800 Pure hypercholesterolemia, unspecified: Secondary | ICD-10-CM | POA: Diagnosis not present

## 2018-12-26 DIAGNOSIS — E559 Vitamin D deficiency, unspecified: Secondary | ICD-10-CM | POA: Diagnosis not present

## 2018-12-26 DIAGNOSIS — Z23 Encounter for immunization: Secondary | ICD-10-CM | POA: Diagnosis not present

## 2018-12-26 DIAGNOSIS — Z1211 Encounter for screening for malignant neoplasm of colon: Secondary | ICD-10-CM | POA: Diagnosis not present

## 2018-12-26 DIAGNOSIS — F419 Anxiety disorder, unspecified: Secondary | ICD-10-CM | POA: Diagnosis not present

## 2018-12-26 DIAGNOSIS — I10 Essential (primary) hypertension: Secondary | ICD-10-CM | POA: Diagnosis not present

## 2019-01-11 DIAGNOSIS — H6122 Impacted cerumen, left ear: Secondary | ICD-10-CM | POA: Diagnosis not present

## 2019-01-11 DIAGNOSIS — H6062 Unspecified chronic otitis externa, left ear: Secondary | ICD-10-CM | POA: Diagnosis not present

## 2019-01-16 DIAGNOSIS — H6062 Unspecified chronic otitis externa, left ear: Secondary | ICD-10-CM | POA: Diagnosis not present

## 2019-01-16 DIAGNOSIS — H6122 Impacted cerumen, left ear: Secondary | ICD-10-CM | POA: Diagnosis not present

## 2019-02-08 ENCOUNTER — Emergency Department (HOSPITAL_COMMUNITY): Payer: Medicare Other

## 2019-02-08 ENCOUNTER — Encounter (HOSPITAL_COMMUNITY): Payer: Self-pay | Admitting: Student

## 2019-02-08 ENCOUNTER — Other Ambulatory Visit: Payer: Self-pay

## 2019-02-08 ENCOUNTER — Emergency Department (HOSPITAL_COMMUNITY)
Admission: EM | Admit: 2019-02-08 | Discharge: 2019-02-08 | Disposition: A | Discharge status: Home / Self Care | Payer: Medicare Other | Attending: Emergency Medicine | Admitting: Emergency Medicine

## 2019-02-08 DIAGNOSIS — W0110XA Fall on same level from slipping, tripping and stumbling with subsequent striking against unspecified object, initial encounter: Secondary | ICD-10-CM | POA: Diagnosis not present

## 2019-02-08 DIAGNOSIS — Y929 Unspecified place or not applicable: Secondary | ICD-10-CM | POA: Insufficient documentation

## 2019-02-08 DIAGNOSIS — S06890A Other specified intracranial injury without loss of consciousness, initial encounter: Secondary | ICD-10-CM

## 2019-02-08 DIAGNOSIS — Z79899 Other long term (current) drug therapy: Secondary | ICD-10-CM | POA: Diagnosis not present

## 2019-02-08 DIAGNOSIS — S199XXA Unspecified injury of neck, initial encounter: Secondary | ICD-10-CM | POA: Diagnosis not present

## 2019-02-08 DIAGNOSIS — I10 Essential (primary) hypertension: Secondary | ICD-10-CM | POA: Insufficient documentation

## 2019-02-08 DIAGNOSIS — M25511 Pain in right shoulder: Secondary | ICD-10-CM | POA: Diagnosis not present

## 2019-02-08 DIAGNOSIS — W19XXXA Unspecified fall, initial encounter: Secondary | ICD-10-CM

## 2019-02-08 DIAGNOSIS — Z23 Encounter for immunization: Secondary | ICD-10-CM | POA: Diagnosis not present

## 2019-02-08 DIAGNOSIS — M25562 Pain in left knee: Secondary | ICD-10-CM | POA: Diagnosis not present

## 2019-02-08 DIAGNOSIS — S0990XA Unspecified injury of head, initial encounter: Secondary | ICD-10-CM

## 2019-02-08 DIAGNOSIS — S4991XA Unspecified injury of right shoulder and upper arm, initial encounter: Secondary | ICD-10-CM | POA: Diagnosis not present

## 2019-02-08 DIAGNOSIS — Y999 Unspecified external cause status: Secondary | ICD-10-CM | POA: Diagnosis not present

## 2019-02-08 DIAGNOSIS — S0181XA Laceration without foreign body of other part of head, initial encounter: Secondary | ICD-10-CM | POA: Diagnosis not present

## 2019-02-08 DIAGNOSIS — S06310A Contusion and laceration of right cerebrum without loss of consciousness, initial encounter: Secondary | ICD-10-CM | POA: Insufficient documentation

## 2019-02-08 DIAGNOSIS — Y939 Activity, unspecified: Secondary | ICD-10-CM | POA: Diagnosis not present

## 2019-02-08 DIAGNOSIS — S0003XA Contusion of scalp, initial encounter: Secondary | ICD-10-CM | POA: Diagnosis not present

## 2019-02-08 DIAGNOSIS — S8992XA Unspecified injury of left lower leg, initial encounter: Secondary | ICD-10-CM | POA: Diagnosis not present

## 2019-02-08 DIAGNOSIS — R58 Hemorrhage, not elsewhere classified: Secondary | ICD-10-CM | POA: Diagnosis not present

## 2019-02-08 DIAGNOSIS — IMO0001 Reserved for inherently not codable concepts without codable children: Secondary | ICD-10-CM

## 2019-02-08 MED ORDER — LIDOCAINE HCL (PF) 1 % IJ SOLN
5.0000 mL | Freq: Once | INTRAMUSCULAR | Status: DC
  Filled 2019-02-08: qty 30

## 2019-02-08 MED ORDER — TETANUS-DIPHTH-ACELL PERTUSSIS 5-2.5-18.5 LF-MCG/0.5 IM SUSP
0.5000 mL | Freq: Once | INTRAMUSCULAR | Status: AC
  Administered 2019-02-08: 0.5 mL via INTRAMUSCULAR
  Filled 2019-02-08: qty 0.5

## 2019-02-08 MED ORDER — OXYCODONE-ACETAMINOPHEN 5-325 MG PO TABS
1.0000 | ORAL_TABLET | Freq: Once | ORAL | Status: AC
  Administered 2019-02-08: 1 via ORAL
  Filled 2019-02-08: qty 1

## 2019-02-08 MED ORDER — LIDOCAINE-EPINEPHRINE-TETRACAINE (LET) SOLUTION
3.0000 mL | Freq: Once | NASAL | Status: AC
  Administered 2019-02-08: 3 mL via TOPICAL
  Filled 2019-02-08: qty 3

## 2019-02-08 NOTE — ED Provider Notes (Signed)
[facility]-EMERGENCY DEPT Provider Note   CSN: [id number] Arrival date & time: 02/08/19  1705    History   Chief Complaint Chief Complaint  Patient presents with  . Fall    HPI Elizabeth [name] is a 78 y.o. female with a hx of anxiety, depression, HTN, and GERD who presents to the emergency department via EMS status post mechanical fall shortly prior to arrival with complaints of head injury, right upper extremity pain, and left knee pain.  Patient states she was walking her dog when she tripped and fell forward.  She denies prodromal lightheadedness, dizziness, chest pain, or dyspnea.  She states that she fell onto the pavement, she did have a head injury without loss of consciousness resulted in a wound to the right forehead.  She states she was able to get up on her own and has been ambulatory since the fall.  Having pain to the right forehead, right upper arm, and the left knee.  She notes that she also has an abrasion to the left knee.  Her current discomfort is an 8 out of 10 in severity without specific alleviating or aggravating factors.  She denies change in vision, numbness, tingling, weakness, nausea, vomiting, chest pain, or abdominal pain.  She notes that she is unsure when her last tetanus shot was. EMS noted BP to be high, she has taken her BP meds as prescribed.  Denies blood thinner use.      HPI  Past Medical History:  Diagnosis Date  . Allergic rhinitis   . Anxiety   . Congenital defect    left arm  . Depression   . GERD (gastroesophageal reflux disease)   . Headache   . Hyperlipemia   . Hypertension   . IBS (irritable bowel syndrome)   . Insomnia   . Memory loss   . Vitamin D deficiency     Patient Active Problem List   Diagnosis Date Noted  . Neck pain 08/31/2017  . Encephalopathy 05/11/2017  . Orthostatic hypotension 05/01/2017  . Generalized weakness 04/30/2017  . Hypertension 04/30/2017  . Depression 04/30/2017  . Right  arm pain 04/30/2017  . Anxiety 04/30/2017  . Passed out 02/06/2017  . Memory loss 02/06/2017    Past Surgical History:  Procedure Laterality Date  . ABDOMINAL HYSTERECTOMY    . CATARACT EXTRACTION Bilateral   . NECK SURGERY       OB History   No obstetric history on file.      Home Medications    Prior to Admission medications   Medication Sig Start Date End Date Taking? Authorizing Provider  Acetaminophen (TYLENOL EXTRA STRENGTH PO) Take by mouth as needed.   Yes [provider]  cyclobenzaprine (FLEXERIL) 5 MG tablet Take 1 tablet (5 mg total) by mouth 3 (three) times daily as needed for muscle spasms. Patient taking differently: Take 5 mg by mouth 2 (two) times daily as needed for muscle spasms.  10/19/18  Yes George Hugh, NP  DULoxetine (CYMBALTA) 60 MG capsule Take 1 capsule (60 mg total) by mouth daily. 10/19/18  Yes George Hugh, NP  metoprolol succinate (TOPROL-XL) 50 MG 24 hr tablet Take 50 mg by mouth 2 (two) times daily. 07/20/18  Yes [provider]  polyethylene glycol (MIRALAX / GLYCOLAX) packet Take 17 g by mouth daily. Patient not taking: Reported on 02/08/2019 05/04/17   Joseph Art, DO    Family History Family History  Adopted: Yes  Problem Relation Age of  Onset  . Breast cancer Mother   . Other Father        unsure of history    Social History Social History   Tobacco Use  . Smoking status: Never Smoker  . Smokeless tobacco: Never Used  Substance Use Topics  . Alcohol use: No  . Drug use: No     Allergies   Norvasc [amlodipine]; Benicar [olmesartan]; and Lisinopril   Review of Systems Review of Systems Constitutional: Negative for chills and fever.  Respiratory: Negative for shortness of breath.   Cardiovascular: Negative for chest pain.  Gastrointestinal: Negative for abdominal pain, nausea and vomiting.  Genitourinary: Negative for dysuria.  Musculoskeletal: Positive for arthralgias and myalgias.  Negative for back pain and neck pain.  Skin: Positive for wound.  Neurological: Positive for headaches. Negative for dizziness, seizures, syncope, speech difficulty, weakness, light-headedness and numbness.  All other systems reviewed and are negative.  Physical Exam Updated Vital Signs BP (!) 180/70 (BP Location: Right Arm)   Pulse 80   Temp 97.6 F (36.4 C)   Resp 18   Ht 5\' 3"  (1.6 m)   Wt 50.8 kg   SpO2 97%   BMI 19.84 kg/m   Physical Exam Vitals signs and nursing note reviewed.  Constitutional:      General: She is not in acute distress.    Appearance: She is well-developed.  HENT:     Head: No raccoon eyes or Battle's sign.     Comments: Patient has a large hematoma to the right forehead just above the R eyebrow with abnormally shaped laceration/wound that seems like a small puncture- 2-3 mm in diameter 3 mm in depth.  No active bleeding.  No appreciable foreign body. Ecchymosis & swelling present. TTP    Right Ear: No hemotympanum.     Left Ear: No hemotympanum.     Ears:     Comments: No hemotympanum.  No battle sign.    Nose: No rhinorrhea.  Eyes:     General:        Right eye: No discharge.        Left eye: No discharge.     Extraocular Movements: Extraocular movements intact.     Conjunctiva/sclera: Conjunctivae normal.     Pupils: Pupils are equal, round, and reactive to light.  Neck:     Musculoskeletal: No spinous process tenderness.     Comments: Range of motion normal.  No midline cervical tenderness. Cardiovascular:     Rate and Rhythm: Normal rate and regular rhythm.     Heart sounds: No murmur.  Pulmonary:     Effort: No respiratory distress.     Breath sounds: Normal breath sounds. No wheezing or rales.  Abdominal:     General: There is no distension.     Palpations: Abdomen is soft.     Tenderness: There is no abdominal tenderness.  Musculoskeletal:     Comments: Upper extremities: Active range of motion intact throughout.  She is tender over  the right humerus.  Proximal and distal joints are nontender.  Neurovascularly intact distally.  Upper extremities otherwise nontender.  She does have congenital deformity to left upper extremity noted. Back: No midline tenderness to the cervical, thoracic, or lumbar region Lower extremities: Normal active range of motion throughout.  Patient has an abrasion noted to the left anterior knee.  She is tender over the patella.  Lower extremities are otherwise nontender.  Skin:    General: Skin is warm and dry.  Findings: No rash.  Neurological:     Comments: Alert.  Clear speech.  CN III through XII grossly intact.  Station grossly intact bilateral upper and lower extremities.  Patient has 5 out of 5 strength with elbow flexion/extension bilaterally.  She has 5 out of 5 strength of plantar/dorsiflexion bilaterally.  She is ambulatory.  Psychiatric:        Behavior: Behavior normal.    ED Treatments / Results  Labs (all labs ordered are listed, but only abnormal results are displayed) Labs Reviewed - No data to display  EKG None  Radiology Ct Head Wo Contrast  Result Date: 02/08/2019 CLINICAL DATA:  Follow-up abnormal finding EXAM: CT HEAD WITHOUT CONTRAST TECHNIQUE: Contiguous axial images were obtained from the base of the skull through the vertex without intravenous contrast. COMPARISON:  CT earlier today FINDINGS: Brain: Small 6 mm hyperdense focus again noted along the anteromedial right frontal lobe, likely small parenchymal contusion. This is unchanged. No new hemorrhage or hydrocephalus. There is atrophy and chronic small vessel disease changes. Stable right basal ganglia old lacunar infarct. Vascular: No hyperdense vessel or unexpected calcification. Skull: No acute calvarial abnormality. Sinuses/Orbits: Visualized paranasal sinuses and mastoids clear. Orbital soft tissues unremarkable. Other: None IMPRESSION: Stable 6 mm hyperdense focus along the medial anterior right frontal lobe,  likely small parenchymal contusion. No new hemorrhage. Atrophy, chronic small vessel disease. Electronically Signed   By: Charlett Nose M.D.   On: 02/08/2019 22:28   Ct Head Wo Contrast  Result Date: 02/08/2019 CLINICAL DATA:  78 y/o  F; fall with injury to the right forehead. EXAM: CT HEAD WITHOUT CONTRAST CT MAXILLOFACIAL WITHOUT CONTRAST CT CERVICAL SPINE WITHOUT CONTRAST TECHNIQUE: Multidetector CT imaging of the head, cervical spine, and maxillofacial structures were performed using the standard protocol without intravenous contrast. Multiplanar CT image reconstructions of the cervical spine and maxillofacial structures were also generated. COMPARISON:  08/15/2017 CT head. 11/21/2016 CT cervical spine. FINDINGS: CT HEAD FINDINGS Brain: 6 mm round density within the right parafalcine superior frontal gyrus (series 3, image 20). No evidence of acute infarction, hydrocephalus, extra-axial collection or herniation. Interval small chronic infarction within the right putamen extending into the frontal periventricular white matter. Stable nonspecific white matter hypodensities compatible with chronic microvascular ischemic changes and stable volume loss of the brain. Vascular: Calcific atherosclerosis of carotid siphons. No hyperdense vessel identified. Skull: Normal. Negative for fracture or focal lesion. Other: Mild right frontal and periorbital scalp contusion. CT MAXILLOFACIAL FINDINGS Osseous: No fracture or mandibular dislocation. No destructive process. Orbits: Negative. No traumatic or inflammatory finding. Sinuses: Trace fluid level within the right maxillary sinus. Paranasal sinuses and mastoid air cells are otherwise normally aerated. Soft tissues: Right frontal and periorbital mild scalp contusion. CT CERVICAL SPINE FINDINGS Alignment: Straightening of cervical lordosis without listhesis. Skull base and vertebrae: No acute fracture. No primary bone lesion or focal pathologic process. C5-C7 solid  vertebral body fusion. Soft tissues and spinal canal: No prevertebral fluid or swelling. No visible canal hematoma. Disc levels: Cervical spondylosis with multilevel disc and facet degenerative changes greatest at C2-C5. Uncovertebral and facet hypertrophy mildly encroach on the neural foramen bilaterally at C2-C5. No high-grade bony spinal canal stenosis. Upper chest: Negative. Other: Negative. IMPRESSION: CT head: 1. 6 mm round density within the medial right frontal lobe. In the setting of trauma this probably represents a small focus of brain parenchymal hemorrhage. Follow-up to resolution is recommended to exclude the possibility of a neoplasm. 2. Interval small chronic infarction within  the right anterior basal ganglia. 3. Stable chronic microvascular ischemic changes and parenchymal volume loss of the brain. CT maxillofacial: 1. Mild right frontal and periorbital scalp contusion. 2. No acute facial fracture or mandibular dislocation. No traumatic finding of the orbits. 3. Trace fluid level within the right maxillary sinus may be related to sinusitis or trauma. CT cervical spine: 1. No acute fracture or dislocation. 2. Cervical spondylosis greatest at C2-C5. No high-grade bony canal stenosis. These results were called by telephone at the time of interpretation on 02/08/2019 at 6:21 pm to Dr. Harvie Heck , who verbally acknowledged these results. Electronically Signed   By: Mitzi Hansen M.D.   On: 02/08/2019 18:23   Ct Cervical Spine Wo Contrast  Result Date: 02/08/2019 CLINICAL DATA:  78 y/o  F; fall with injury to the right forehead. EXAM: CT HEAD WITHOUT CONTRAST CT MAXILLOFACIAL WITHOUT CONTRAST CT CERVICAL SPINE WITHOUT CONTRAST TECHNIQUE: Multidetector CT imaging of the head, cervical spine, and maxillofacial structures were performed using the standard protocol without intravenous contrast. Multiplanar CT image reconstructions of the cervical spine and maxillofacial structures were  also generated. COMPARISON:  08/15/2017 CT head. 11/21/2016 CT cervical spine. FINDINGS: CT HEAD FINDINGS Brain: 6 mm round density within the right parafalcine superior frontal gyrus (series 3, image 20). No evidence of acute infarction, hydrocephalus, extra-axial collection or herniation. Interval small chronic infarction within the right putamen extending into the frontal periventricular white matter. Stable nonspecific white matter hypodensities compatible with chronic microvascular ischemic changes and stable volume loss of the brain. Vascular: Calcific atherosclerosis of carotid siphons. No hyperdense vessel identified. Skull: Normal. Negative for fracture or focal lesion. Other: Mild right frontal and periorbital scalp contusion. CT MAXILLOFACIAL FINDINGS Osseous: No fracture or mandibular dislocation. No destructive process. Orbits: Negative. No traumatic or inflammatory finding. Sinuses: Trace fluid level within the right maxillary sinus. Paranasal sinuses and mastoid air cells are otherwise normally aerated. Soft tissues: Right frontal and periorbital mild scalp contusion. CT CERVICAL SPINE FINDINGS Alignment: Straightening of cervical lordosis without listhesis. Skull base and vertebrae: No acute fracture. No primary bone lesion or focal pathologic process. C5-C7 solid vertebral body fusion. Soft tissues and spinal canal: No prevertebral fluid or swelling. No visible canal hematoma. Disc levels: Cervical spondylosis with multilevel disc and facet degenerative changes greatest at C2-C5. Uncovertebral and facet hypertrophy mildly encroach on the neural foramen bilaterally at C2-C5. No high-grade bony spinal canal stenosis. Upper chest: Negative. Other: Negative. IMPRESSION: CT head: 1. 6 mm round density within the medial right frontal lobe. In the setting of trauma this probably represents a small focus of brain parenchymal hemorrhage. Follow-up to resolution is recommended to exclude the possibility of a  neoplasm. 2. Interval small chronic infarction within the right anterior basal ganglia. 3. Stable chronic microvascular ischemic changes and parenchymal volume loss of the brain. CT maxillofacial: 1. Mild right frontal and periorbital scalp contusion. 2. No acute facial fracture or mandibular dislocation. No traumatic finding of the orbits. 3. Trace fluid level within the right maxillary sinus may be related to sinusitis or trauma. CT cervical spine: 1. No acute fracture or dislocation. 2. Cervical spondylosis greatest at C2-C5. No high-grade bony canal stenosis. These results were called by telephone at the time of interpretation on 02/08/2019 at 6:21 pm to Dr. Harvie Heck , who verbally acknowledged these results. Electronically Signed   By: Mitzi Hansen M.D.   On: 02/08/2019 18:23   Dg Knee Complete 4 Views Left  Result Date: 02/08/2019 CLINICAL  DATA:  Fall, knee pain EXAM: LEFT KNEE - COMPLETE 4+ VIEW COMPARISON:  None. FINDINGS: No evidence of fracture, dislocation, or joint effusion. No evidence of arthropathy or other focal bone abnormality. Soft tissues are unremarkable. IMPRESSION: Negative. Electronically Signed   By: Charlett NoseKevin  Dover M.D.   On: 02/08/2019 17:54   Dg Humerus Right  Result Date: 02/08/2019 CLINICAL DATA:  78 y/o  F; fall with right humerus pain. EXAM: RIGHT HUMERUS - 2+ VIEW COMPARISON:  04/30/2017 right shoulder radiographs. FINDINGS: There is no evidence of fracture or other focal bone lesions. Soft tissues are unremarkable. Osteoarthrosis of the glenohumeral joint productive changes of the greater tubercle and humeral neck. IMPRESSION: No acute fracture or dislocation identified. Stable glenohumeral joint osteoarthrosis. Electronically Signed   By: Mitzi HansenLance  Furusawa-Stratton M.D.   On: 02/08/2019 18:00   Ct Maxillofacial Wo Cm  Result Date: 02/08/2019 CLINICAL DATA:  78 y/o  F; fall with injury to the right forehead. EXAM: CT HEAD WITHOUT CONTRAST CT  MAXILLOFACIAL WITHOUT CONTRAST CT CERVICAL SPINE WITHOUT CONTRAST TECHNIQUE: Multidetector CT imaging of the head, cervical spine, and maxillofacial structures were performed using the standard protocol without intravenous contrast. Multiplanar CT image reconstructions of the cervical spine and maxillofacial structures were also generated. COMPARISON:  08/15/2017 CT head. 11/21/2016 CT cervical spine. FINDINGS: CT HEAD FINDINGS Brain: 6 mm round density within the right parafalcine superior frontal gyrus (series 3, image 20). No evidence of acute infarction, hydrocephalus, extra-axial collection or herniation. Interval small chronic infarction within the right putamen extending into the frontal periventricular white matter. Stable nonspecific white matter hypodensities compatible with chronic microvascular ischemic changes and stable volume loss of the brain. Vascular: Calcific atherosclerosis of carotid siphons. No hyperdense vessel identified. Skull: Normal. Negative for fracture or focal lesion. Other: Mild right frontal and periorbital scalp contusion. CT MAXILLOFACIAL FINDINGS Osseous: No fracture or mandibular dislocation. No destructive process. Orbits: Negative. No traumatic or inflammatory finding. Sinuses: Trace fluid level within the right maxillary sinus. Paranasal sinuses and mastoid air cells are otherwise normally aerated. Soft tissues: Right frontal and periorbital mild scalp contusion. CT CERVICAL SPINE FINDINGS Alignment: Straightening of cervical lordosis without listhesis. Skull base and vertebrae: No acute fracture. No primary bone lesion or focal pathologic process. C5-C7 solid vertebral body fusion. Soft tissues and spinal canal: No prevertebral fluid or swelling. No visible canal hematoma. Disc levels: Cervical spondylosis with multilevel disc and facet degenerative changes greatest at C2-C5. Uncovertebral and facet hypertrophy mildly encroach on the neural foramen bilaterally at C2-C5. No  high-grade bony spinal canal stenosis. Upper chest: Negative. Other: Negative. IMPRESSION: CT head: 1. 6 mm round density within the medial right frontal lobe. In the setting of trauma this probably represents a small focus of brain parenchymal hemorrhage. Follow-up to resolution is recommended to exclude the possibility of a neoplasm. 2. Interval small chronic infarction within the right anterior basal ganglia. 3. Stable chronic microvascular ischemic changes and parenchymal volume loss of the brain. CT maxillofacial: 1. Mild right frontal and periorbital scalp contusion. 2. No acute facial fracture or mandibular dislocation. No traumatic finding of the orbits. 3. Trace fluid level within the right maxillary sinus may be related to sinusitis or trauma. CT cervical spine: 1. No acute fracture or dislocation. 2. Cervical spondylosis greatest at C2-C5. No high-grade bony canal stenosis. These results were called by telephone at the time of interpretation on 02/08/2019 at 6:21 pm to Dr. Harvie HeckSAMANTHA  , who verbally acknowledged these results. Electronically Signed   By:  Mitzi Hansen M.D.   On: 02/08/2019 18:23    Procedures Procedures (including critical care time) .Marland KitchenLaceration Repair Date/Time: 02/08/2019 9:30 PM Performed by: Cherly Anderson, PA-C Authorized by: Cherly Anderson, PA-C   Consent:    Consent obtained:  Verbal   Consent given by:  Patient   Risks discussed:  Infection, need for additional repair, nerve damage, poor wound healing, poor cosmetic result, pain, retained foreign body, tendon damage and vascular damage Anesthesia (see MAR for exact dosages):    Anesthesia method:  Topical application   Topical anesthetic:  LET Laceration details:    Location:  Face   Face location:  Forehead   Length (cm):  0.3   Depth (mm):  3 Repair type:    Repair type:  Simple Exploration:    Hemostasis achieved with:  Direct pressure   Wound exploration: wound  explored through full range of motion and entire depth of wound probed and visualized   Treatment:    Area cleansed with:  Betadine   Amount of cleaning:  Standard   Irrigation solution:  Sterile water   Irrigation method:  Pressure wash Skin repair:    Repair method:  Sutures   Suture size:  5-0   Wound skin closure material used: vicryle rapide.   Suture technique:  Simple interrupted   Number of sutures:  1 Approximation:    Approximation:  Close Post-procedure details:    Dressing:  Open (no dressing)   Patient tolerance of procedure:  Tolerated well, no immediate complications  Medications Ordered in ED Medications  lidocaine (PF) (XYLOCAINE) 1 % injection 5 mL (5 mLs Infiltration Not Given 02/08/19 2221)  oxyCODONE-acetaminophen (PERCOCET/ROXICET) 5-325 MG per tablet 1 tablet (1 tablet Oral Given 02/08/19 1812)  Tdap (BOOSTRIX) injection 0.5 mL (0.5 mLs Intramuscular Given 02/08/19 1812)  lidocaine-EPINEPHrine-tetracaine (LET) solution (3 mLs Topical Given 02/08/19 2029)  oxyCODONE-acetaminophen (PERCOCET/ROXICET) 5-325 MG per tablet 1 tablet (1 tablet Oral Given 02/08/19 2220)     Initial Impression / Assessment and Plan / ED Course  I have reviewed the triage vital signs and the nursing notes.  Pertinent labs & imaging results that were available during my care of the patient were reviewed by me and considered in my medical decision making (see chart for details).    Patient presents to the ER s/p mechanical fall with head injury. Nontoxic appearing, vitals w/ elevated BP, otherwise WNL. Obvious head injury w/ hematoma, ecchymosis, and abnormally shaped puncture type laceration noted. PERRL.EOMI. No focal neuro deficits. Will proceed w/ CT head, maxillofacial,c-spine as well as plain films of the R humerus & L knee. Percocet for pain.   [facility] x-rays negative for fx/dislocation- NVI distally.  CT Cspine- no acute fx/dislocation CT maxillofacial- scalp contusion consistent w/  exam. No facial fracture or mandibular dislocation.  CT head:  6 mm round density within the medial right frontal lobe. In the setting of trauma this probably represents a small focus of brain parenchymal hemorrhage. Follow-up to resolution is recommended to exclude the possibility of a neoplasm  18:23: CONSULT: Discussed CT head findings of 6mm density on head CT w/ radiologist Dr. Harrie Jeans- favor small brain parenchymal hemorrhage in setting of trauma.     18:57: CONSULT: Discussed case, specifically head CT w/ neurosurgeon Dr. Wynetta Emery- recommends repeat head CT 4 hours from initial CT, if no change, can go home with return precautions & follow up in his office within 1 week.   While waiting for repeat CT imaging  further evaluation of head wound performed.  Pressure irrigation performed. Wound explored and base of wound visualized in a bloodless field without evidence of foreign body. Laceration repair per procedure note above, tolerated well. Tetanus updated at today's visit.   Repeat head CT:  Stable 6 mm hyperdense focus along the medial anterior right frontal lobe, likely small parenchymal contusion. No new hemorrhage. Atrophy, chronic small vessel disease.   Patient has remained without acute mental status changes or neuro deficit development in the ER throughout her observation. Will discharge home at this time with neurosurgery follow up & strict return precautions. I discussed results, treatment plan, need for follow-up, and return precautions with the patient & her daughter & son (who lives with her) in detail. Provided opportunity for questions, patient & her daughter/son confirmed understanding and are in agreement with plan.    This is a shared visit with supervising physician Dr. Donnald Garre who has independently evaluated patient & provided guidance in evaluation/management/disposition, in agreement with care   Final Clinical Impressions(s) / ED Diagnoses   Final diagnoses:  Fall,  initial encounter  Injury of head, initial encounter  Right-sided intracranial contusion, without loss of consciousness, initial encounter [facility])  Laceration of forehead, initial encounter    ED Discharge Orders    None       Desmond Lope 02/08/19 2253    Arby Barrette, MD 02/12/19 [phone]

## 2019-02-08 NOTE — ED Triage Notes (Signed)
Per EMS: Pt form home.  Pt fell walking her dog. Pt denies LOC.  No pt c/o of pain, other than 2 small puncture-like wounds to R fore-head.  Pt is hypertensive at 200/120.  Pt states she takes her BP meds everyday at 8 am and 8 pm.

## 2019-02-08 NOTE — ED Notes (Signed)
Bed: WA07 Expected date:  Expected time:  Means of arrival:  Comments: 78 yo fall, head injury

## 2019-02-08 NOTE — ED Provider Notes (Signed)
Medical screening examination/treatment/procedure(s) were conducted as a shared visit with non-physician practitioner(s) and myself.  I personally evaluated the patient during the encounter.  None Patient was getting very to walk her dog.  She reports that she somehow got tripped and lost her balance and fell.  She struck her right forehead.  She denies loss of consciousness.  She reports recalling all events.  She reports she does have pain in her face where she has a large amount of swelling.  She denies weakness numbness or tingling of extremities.  She reports she was ambulatory after the event.  She reports she does have some pain in her right humerus area.  She reports that is getting better but still uncomfortable.  No numbness tingling or paresthesias to the upper or lower extremities.  Patient is alert and appropriate.  Mental status clear.  Large hematoma to right forehead with stellate, small laceration area.  Extraocular motions normal.  No nasal bleeding.  C-spine nontender palpation.  No respiratory distress.  Chest wall nontender.  Tenderness of the mid humerus right but no evident deformities.  Range of motion intact at shoulder and elbow.  Patient neurovascularly intact.  I agree with plan of management.   Arby Barrette, MD 02/08/19 1743

## 2019-02-08 NOTE — Discharge Instructions (Signed)
You were seen in the emergency department following a fall with head injury.  Your imaging did not show any fractures or dislocations.  Your imaging did show that you had a 6 mm area within the brain (within the parenchyma) that appears consistent with a contusion.  The repeat CT scan did not show significant change in this.  We would like you to follow-up closely with Dr. Wynetta Emery, a neurosurgeon in the area, within 1 week for reevaluation.  You also have a wound to your forehead that was closed with 1 absorbable stitch.  The stitch will not need to be removed, it will come out on its own.  These keep the area clean and dry for the next 24 hours, after 24 hours you may get it wet.  Apply ice wrapped in a towel 20 minutes on 40 minutes off to the bruised/swollen area above the right eye for the next 24 to 48 hours.  Please take Tylenol per over-the-counter dosing.with discomfort.  Be sure to rest plenty  Do not take NSAIDs such as ibuprofen Motrin, Advil, Aleve, Goody powders, naproxen etc.  Do not take aspirin.  We would like you to follow-up closely with your primary care provider within 3 days for a general reassessment of your ER visit and a recheck of your blood pressure as it was elevated in the ER. We would like you to follow-up with Dr. Wynetta Emery within 1 week.  Return to the ER immediately for new or worsening symptoms including but not limited to headache, vomiting, change in vision, numbness, weakness, dizziness like the room spinning, change in speech, difficulty walking, confusion, or any other concern at all.  Please also see the attached handouts for sutured wound care and head injury.

## 2019-02-19 DIAGNOSIS — S06340A Traumatic hemorrhage of right cerebrum without loss of consciousness, initial encounter: Secondary | ICD-10-CM | POA: Diagnosis not present

## 2019-04-10 ENCOUNTER — Telehealth: Payer: Self-pay | Admitting: *Deleted

## 2019-04-10 NOTE — Telephone Encounter (Signed)
Pt called in and r/s to 04/24/2019 @ 7:45am

## 2019-04-10 NOTE — Telephone Encounter (Signed)
Noted  

## 2019-04-10 NOTE — Telephone Encounter (Addendum)
LMVM to call back to r/s appt from 04-19-19 Friday to another date.  Will cancel appt.  JV/NP using DOXY.ME.

## 2019-04-19 ENCOUNTER — Ambulatory Visit: Payer: Medicare Other | Admitting: Adult Health

## 2019-04-22 NOTE — Telephone Encounter (Signed)
Left vm for patient to call back that visit will be video due to COVID  19. I stated we need her verbal consent to do video and to file insurance. I also stated we are not doing in office visits at this time. I stated we need to know if she has a smart phone like apple, iphone, android or smart phone or lap top or ipap, tablet with camera on it. I advise her to call back with further instructions.

## 2019-04-23 NOTE — Telephone Encounter (Signed)
Left vm for patient that her visit will be video visit. We are not doing in office visits at this time. Need to know her email address. Need to know if she has a phone with a camera on it. Need verbal consent and to file insurance.

## 2019-04-24 ENCOUNTER — Ambulatory Visit: Payer: Medicare Other | Admitting: Adult Health

## 2019-04-24 ENCOUNTER — Other Ambulatory Visit: Payer: Self-pay

## 2019-04-24 NOTE — Telephone Encounter (Signed)
Please schedule with Maralyn Sago NP thanks. This is a DR. Terrace Arabia pt. See previous note thanks.

## 2019-04-24 NOTE — Telephone Encounter (Signed)
Pt has called stating she received a message asking her to call back.  Pt states that she had her appointment with Shanda Bumps this morning.  Phone rep was unable to locate message of any kind to confirm that.  Pt is asking that if RN needs to call her she will be available tomorrow but not Friday.  Pt was informed office will not be open on Friday.

## 2019-04-25 NOTE — Telephone Encounter (Signed)
As requested by RN Katrina, pt was just called on [phone] and voicemail was left asking pt to call and schedule a follow up appointment(doxy.me vv) with NP Sarah.  Telephone # to office was left for pt.

## 2019-05-14 DIAGNOSIS — H903 Sensorineural hearing loss, bilateral: Secondary | ICD-10-CM | POA: Diagnosis not present

## 2019-05-14 DIAGNOSIS — H9313 Tinnitus, bilateral: Secondary | ICD-10-CM | POA: Diagnosis not present

## 2019-05-14 DIAGNOSIS — H6123 Impacted cerumen, bilateral: Secondary | ICD-10-CM | POA: Diagnosis not present

## 2019-07-04 ENCOUNTER — Encounter: Payer: Self-pay | Admitting: Adult Health

## 2019-07-04 ENCOUNTER — Other Ambulatory Visit: Payer: Self-pay

## 2019-07-04 ENCOUNTER — Ambulatory Visit (INDEPENDENT_AMBULATORY_CARE_PROVIDER_SITE_OTHER): Payer: Medicare Other | Admitting: Adult Health

## 2019-07-04 VITALS — BP 157/79 | HR 78 | Temp 97.3°F | Ht 63.0 in | Wt 130.2 lb

## 2019-07-04 DIAGNOSIS — M542 Cervicalgia: Secondary | ICD-10-CM | POA: Diagnosis not present

## 2019-07-04 DIAGNOSIS — F419 Anxiety disorder, unspecified: Secondary | ICD-10-CM | POA: Diagnosis not present

## 2019-07-04 MED ORDER — CYCLOBENZAPRINE HCL 5 MG PO TABS: 5.0000 mg | ORAL_TABLET | Freq: Two times a day (BID) | ORAL | 3 refills | Status: DC | PRN

## 2019-07-04 MED ORDER — DULOXETINE HCL 60 MG PO CPEP: 60.0000 mg | ORAL_CAPSULE | Freq: Every day | ORAL | 3 refills | Status: DC

## 2019-07-04 NOTE — Progress Notes (Signed)
I agree with the above plan 
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
## 2019-07-04 NOTE — Patient Instructions (Addendum)
Your Plan:  Continue current dose of Flexeril and Cymbalta  Referral placed to psychology for assistance and ongoing/worsening depression and anxiety  Follow-up in 6 months or call earlier if needed     Thank you for coming to see [place] at [facility]. I hope we have been able to provide you high quality care today.  You may receive a patient satisfaction survey over the next few weeks. We would appreciate your feedback and comments so that we may continue to improve ourselves and the health of our patients.

## 2019-07-30 DIAGNOSIS — Q7132 Congenital absence of left hand and finger: Secondary | ICD-10-CM | POA: Diagnosis not present

## 2019-07-30 DIAGNOSIS — M79601 Pain in right arm: Secondary | ICD-10-CM | POA: Diagnosis not present

## 2019-07-30 DIAGNOSIS — I10 Essential (primary) hypertension: Secondary | ICD-10-CM | POA: Diagnosis not present

## 2019-07-30 DIAGNOSIS — F419 Anxiety disorder, unspecified: Secondary | ICD-10-CM | POA: Diagnosis not present

## 2019-08-14 DIAGNOSIS — M79601 Pain in right arm: Secondary | ICD-10-CM | POA: Diagnosis not present

## 2019-08-14 DIAGNOSIS — M19011 Primary osteoarthritis, right shoulder: Secondary | ICD-10-CM | POA: Diagnosis not present

## 2019-09-03 DIAGNOSIS — M19011 Primary osteoarthritis, right shoulder: Secondary | ICD-10-CM | POA: Diagnosis not present

## 2019-09-03 NOTE — Progress Notes (Signed)
   [place], [place] - [address] [place] [place] [address] Phone: [phone] Fax: [phone]      Your procedure is scheduled on 09/09/2019.  Report to [facility] Main Entrance "A" at 0530 A.M., and check in at the Admitting office.  Call this number if you have problems the morning of surgery:  [phone]  Call [phone] if you have any questions prior to your surgery date Monday-Friday 8am-4pm    Remember:  Do not eat or drink after midnight the night before your surgery  You may drink clear liquids until 0430 AM the morning of your surgery.   Clear liquids allowed are: Water, Non-Citrus Juices (without pulp), Carbonated Beverages, Clear Tea, Black Coffee Only, and Gatorade  Enhanced Recovery after Surgery for Orthopedics Enhanced Recovery after Surgery is a protocol used to improve the stress on your body and your recovery after surgery.  Patient Instructions  . The night before surgery:  o No food after midnight. ONLY clear liquids after midnight  .  Marland Kitchen The day of surgery (if you do NOT have diabetes):  o Drink ONE (1) Pre-Surgery Clear Ensure as directed.   o This drink was given to you during your hospital  pre-op appointment visit. o The pre-op nurse will instruct you on the time to drink the  Pre-Surgery Ensure depending on your surgery time. o Finish the drink at the designated time by the pre-op nurse.  o Nothing else to drink after completing the  Pre-Surgery Clear Ensure.     Take these medicines the morning of surgery with A SIP OF WATER   Metoprolol succinate (TOPROL-XL) 50 MG 24 hr tablet  Cyclobenzaprine (FLEXERIL) 5 MG tablet- If needed  7 days prior to surgery STOP taking any Aspirin (unless otherwise instructed by your surgeon), Aleve, Naproxen, Ibuprofen, Motrin, Advil, Goody's, BC's, all herbal medications, fish oil, and all vitamins.    The Morning of Surgery  Do not wear jewelry,  make-up or nail polish.  Do not wear lotions, powders, or perfumes, or deodorant  Do not shave 48 hours prior to surgery.   Do not bring valuables to the hospital.  [facility] is not responsible for any belongings or valuables.  If you are a smoker, DO NOT Smoke 24 hours prior to surgery IF you wear a CPAP at night please bring your mask, tubing, and machine the morning of surgery   Remember that you must have someone to transport you home after your surgery, and remain with you for 24 hours if you are discharged the same day.   Contacts, glasses, hearing aids, dentures or bridgework may not be worn into surgery.    Leave your suitcase in the car.  After surgery it may be brought to your room.  For patients admitted to the hospital, discharge time will be determined by your treatment team.  Patients discharged the day of surgery will not be allowed to drive home.       Please read over the following fact sheets that you were given.

## 2019-09-04 ENCOUNTER — Other Ambulatory Visit: Payer: Self-pay

## 2019-09-04 ENCOUNTER — Encounter (HOSPITAL_COMMUNITY): Payer: Self-pay

## 2019-09-04 ENCOUNTER — Encounter (HOSPITAL_COMMUNITY)
Admission: RE | Admit: 2019-09-04 | Discharge: 2019-09-04 | Disposition: A | Discharge status: Home / Self Care | Payer: Medicare Other | Source: Ambulatory Visit | Attending: Orthopaedic Surgery | Admitting: Orthopaedic Surgery

## 2019-09-04 DIAGNOSIS — Z01812 Encounter for preprocedural laboratory examination: Secondary | ICD-10-CM | POA: Insufficient documentation

## 2019-09-04 DIAGNOSIS — M19011 Primary osteoarthritis, right shoulder: Secondary | ICD-10-CM | POA: Diagnosis not present

## 2019-09-04 DIAGNOSIS — I10 Essential (primary) hypertension: Secondary | ICD-10-CM | POA: Diagnosis not present

## 2019-09-04 HISTORY — DX: Presence of spectacles and contact lenses: Z97.3

## 2019-09-04 HISTORY — DX: Unspecified osteoarthritis, unspecified site: M19.90

## 2019-09-04 LAB — CBC
HCT: 42.4 % (ref 36.0–46.0)
Hemoglobin: 13 g/dL (ref 12.0–15.0)
MCH: 27.7 pg (ref 26.0–34.0)
MCHC: 30.7 g/dL (ref 30.0–36.0)
MCV: 90.4 fL (ref 80.0–100.0)
Platelets: 249 10*3/uL (ref 150–400)
RBC: 4.69 MIL/uL (ref 3.87–5.11)
RDW: 14.5 % (ref 11.5–15.5)
WBC: 6.1 10*3/uL (ref 4.0–10.5)
nRBC: 0 % (ref 0.0–0.2)

## 2019-09-04 LAB — SURGICAL PCR SCREEN
MRSA, PCR: NEGATIVE
Staphylococcus aureus: NEGATIVE

## 2019-09-04 LAB — BASIC METABOLIC PANEL
Anion gap: 9 (ref 5–15)
BUN: 14 mg/dL (ref 8–23)
CO2: 26 mmol/L (ref 22–32)
Calcium: 9.4 mg/dL (ref 8.9–10.3)
Chloride: 103 mmol/L (ref 98–111)
Creatinine, Ser: 0.79 mg/dL (ref 0.44–1.00)
GFR calc Af Amer: >60 mL/min (ref >60)
GFR calc non Af Amer: >60 mL/min (ref >60)
Glucose, Bld: 89 mg/dL (ref 70–99)
Potassium: 4.2 mmol/L (ref 3.5–5.1)
Sodium: 138 mmol/L (ref 135–145)

## 2019-09-04 NOTE — Pre-Procedure Instructions (Addendum)
   Elizabeth [name]  09/04/2019     [place], [place] [address] [place] [place] [address] Phone: [phone] Fax: [phone]   Your procedure is scheduled on Monday, September 09, 2019.  Report to [facility] Admitting at 5:30 A.M.  Call this number if you have problems the morning of surgery:  [phone]   Remember: Brush your teeth the morning of surgery with your regular toothpaste.  Do not eat  after midnight Sunday, September 08, 2019  You may drink clear liquids until 4:30A.M. .  Clear liquids allowed are  : Water, Juice (non-citric and without pulp), Carbonated beverages, Clear Tea, Black Coffee only, Plain Jell-O only, Gatorade and Plain Popsicles only Drink your Ensure Pre-Surgery  Drink by 4:30A.M. the morning of surgery ( Do not sip).    Take these medicines the morning of surgery with A SIP OF WATER :  metoprolol succinate (TOPROL-XL) and  DULoxetine (CYMBALTA) Stop taking Aspirin (unless otherwise advised by surgeon), vitamins, fish oil and herbal medications. Do not take any NSAIDs ie: Ibuprofen, Advil, Naproxen (Aleve), Motrin, BC and Goody Powder; stop now.   Do not wear jewelry, make-up or nail polish.  Do not wear lotions, powders, or perfumes, or deodorant.  Do not shave 48 hours prior to surgery. Do not bring valuables to the hospital.  [facility] is not responsible for any belongings or valuables.  Contacts, dentures or bridgework may not be worn into surgery.  Leave your suitcase in the car.  After surgery it may be brought to your room. Patients discharged the day of surgery will not be allowed to drive home.  Special instructions: See " [facility] Preparing For Surgery " sheet. Please read over the following fact sheets that you were given. Pain Booklet, Coughing and Deep Breathing, MRSA Information and Surgical Site Infection Prevention

## 2019-09-04 NOTE — Progress Notes (Signed)
Pt denies SOB, chest pain, and being under the care of a cardiologist. Pt stated that PCP is Dr. Orpah Melter. Pt denies having a stress test and cardiac cath. Pt denies having an EKG and chest x ray in the last year. Pt denies recent labs. Pt verbalized understanding of all pre-op instructions.

## 2019-09-05 ENCOUNTER — Other Ambulatory Visit (HOSPITAL_COMMUNITY)
Admission: RE | Admit: 2019-09-05 | Discharge: 2019-09-05 | Disposition: A | Discharge status: Home / Self Care | Payer: Medicare Other | Source: Ambulatory Visit | Attending: Orthopaedic Surgery | Admitting: Orthopaedic Surgery

## 2019-09-05 DIAGNOSIS — Z01812 Encounter for preprocedural laboratory examination: Secondary | ICD-10-CM | POA: Insufficient documentation

## 2019-09-05 DIAGNOSIS — Z20828 Contact with and (suspected) exposure to other viral communicable diseases: Secondary | ICD-10-CM | POA: Diagnosis not present

## 2019-09-06 LAB — NOVEL CORONAVIRUS, NAA (HOSP ORDER, SEND-OUT TO REF LAB; TAT 18-24 HRS): SARS-CoV-2, NAA: NOT DETECTED

## 2019-09-06 MED ORDER — TRANEXAMIC ACID 1000 MG/10ML IV SOLN
2000.0000 mg | INTRAVENOUS | Status: DC
  Filled 2019-09-06: qty 20

## 2019-09-09 ENCOUNTER — Other Ambulatory Visit: Payer: Self-pay

## 2019-09-09 ENCOUNTER — Inpatient Hospital Stay (HOSPITAL_COMMUNITY): Payer: Medicare Other | Admitting: Certified Registered Nurse Anesthetist

## 2019-09-09 ENCOUNTER — Inpatient Hospital Stay (HOSPITAL_COMMUNITY)
Admission: RE | Admit: 2019-09-09 | Discharge: 2019-09-10 | DRG: 483 | Disposition: A | Discharge status: Home Health | Payer: Medicare Other | Attending: Orthopaedic Surgery | Admitting: Orthopaedic Surgery

## 2019-09-09 ENCOUNTER — Inpatient Hospital Stay (HOSPITAL_COMMUNITY): Payer: Medicare Other

## 2019-09-09 ENCOUNTER — Encounter (HOSPITAL_COMMUNITY)
Admission: RE | Disposition: A | Discharge status: Home Health | Payer: Self-pay | Source: Home / Self Care | Attending: Orthopaedic Surgery

## 2019-09-09 ENCOUNTER — Encounter (HOSPITAL_COMMUNITY): Payer: Self-pay

## 2019-09-09 DIAGNOSIS — M25711 Osteophyte, right shoulder: Secondary | ICD-10-CM | POA: Diagnosis present

## 2019-09-09 DIAGNOSIS — K589 Irritable bowel syndrome without diarrhea: Secondary | ICD-10-CM | POA: Diagnosis not present

## 2019-09-09 DIAGNOSIS — E785 Hyperlipidemia, unspecified: Secondary | ICD-10-CM | POA: Diagnosis present

## 2019-09-09 DIAGNOSIS — M19011 Primary osteoarthritis, right shoulder: Principal | ICD-10-CM | POA: Diagnosis present

## 2019-09-09 DIAGNOSIS — Z803 Family history of malignant neoplasm of breast: Secondary | ICD-10-CM

## 2019-09-09 DIAGNOSIS — Q899 Congenital malformation, unspecified: Secondary | ICD-10-CM | POA: Diagnosis not present

## 2019-09-09 DIAGNOSIS — Z79899 Other long term (current) drug therapy: Secondary | ICD-10-CM | POA: Diagnosis not present

## 2019-09-09 DIAGNOSIS — I10 Essential (primary) hypertension: Secondary | ICD-10-CM | POA: Diagnosis not present

## 2019-09-09 DIAGNOSIS — Z9842 Cataract extraction status, left eye: Secondary | ICD-10-CM

## 2019-09-09 DIAGNOSIS — F329 Major depressive disorder, single episode, unspecified: Secondary | ICD-10-CM | POA: Diagnosis not present

## 2019-09-09 DIAGNOSIS — Z96611 Presence of right artificial shoulder joint: Secondary | ICD-10-CM

## 2019-09-09 DIAGNOSIS — K219 Gastro-esophageal reflux disease without esophagitis: Secondary | ICD-10-CM | POA: Diagnosis present

## 2019-09-09 DIAGNOSIS — F419 Anxiety disorder, unspecified: Secondary | ICD-10-CM | POA: Diagnosis present

## 2019-09-09 DIAGNOSIS — Z90710 Acquired absence of both cervix and uterus: Secondary | ICD-10-CM | POA: Diagnosis not present

## 2019-09-09 DIAGNOSIS — Z9841 Cataract extraction status, right eye: Secondary | ICD-10-CM

## 2019-09-09 DIAGNOSIS — G8918 Other acute postprocedural pain: Secondary | ICD-10-CM | POA: Diagnosis not present

## 2019-09-09 DIAGNOSIS — Z888 Allergy status to other drugs, medicaments and biological substances status: Secondary | ICD-10-CM | POA: Diagnosis not present

## 2019-09-09 DIAGNOSIS — F418 Other specified anxiety disorders: Secondary | ICD-10-CM | POA: Diagnosis not present

## 2019-09-09 DIAGNOSIS — Z471 Aftercare following joint replacement surgery: Secondary | ICD-10-CM | POA: Diagnosis not present

## 2019-09-09 DIAGNOSIS — Z96619 Presence of unspecified artificial shoulder joint: Secondary | ICD-10-CM

## 2019-09-09 HISTORY — PX: REVERSE SHOULDER ARTHROPLASTY: SHX5054

## 2019-09-09 SURGERY — ARTHROPLASTY, SHOULDER, TOTAL, REVERSE
Anesthesia: General | Site: Shoulder | Laterality: Right

## 2019-09-09 MED ORDER — VANCOMYCIN HCL 1000 MG IV SOLR
INTRAVENOUS | Status: AC
  Filled 2019-09-09: qty 1000

## 2019-09-09 MED ORDER — EPHEDRINE 5 MG/ML INJ
INTRAVENOUS | Status: AC
  Filled 2019-09-09: qty 10

## 2019-09-09 MED ORDER — SUGAMMADEX SODIUM 200 MG/2ML IV SOLN
INTRAVENOUS | Status: DC | PRN
  Administered 2019-09-09: 200 mg via INTRAVENOUS

## 2019-09-09 MED ORDER — METHOCARBAMOL 500 MG PO TABS
500.0000 mg | ORAL_TABLET | Freq: Four times a day (QID) | ORAL | Status: DC | PRN
  Administered 2019-09-09 – 2019-09-10 (×2): 500 mg via ORAL
  Filled 2019-09-09 (×2): qty 1

## 2019-09-09 MED ORDER — ACETAMINOPHEN 325 MG PO TABS: 325.0000 mg | ORAL_TABLET | Freq: Four times a day (QID) | ORAL | Status: DC | PRN

## 2019-09-09 MED ORDER — CHLORHEXIDINE GLUCONATE 4 % EX LIQD: 60.0000 mL | Freq: Once | CUTANEOUS | Status: DC

## 2019-09-09 MED ORDER — ROPIVACAINE HCL 5 MG/ML IJ SOLN
INTRAMUSCULAR | Status: DC | PRN
  Administered 2019-09-09: 30 mL via PERINEURAL

## 2019-09-09 MED ORDER — PHENYLEPHRINE 40 MCG/ML (10ML) SYRINGE FOR IV PUSH (FOR BLOOD PRESSURE SUPPORT)
PREFILLED_SYRINGE | INTRAVENOUS | Status: AC
  Filled 2019-09-09: qty 10

## 2019-09-09 MED ORDER — ROCURONIUM BROMIDE 10 MG/ML (PF) SYRINGE
PREFILLED_SYRINGE | INTRAVENOUS | Status: AC
  Filled 2019-09-09: qty 10

## 2019-09-09 MED ORDER — OXYCODONE HCL 5 MG PO TABS: 5.0000 mg | ORAL_TABLET | Freq: Once | ORAL | Status: DC | PRN

## 2019-09-09 MED ORDER — VANCOMYCIN HCL 1000 MG IV SOLR
INTRAVENOUS | Status: DC | PRN
  Administered 2019-09-09: 1000 mg

## 2019-09-09 MED ORDER — METHOCARBAMOL 1000 MG/10ML IJ SOLN: 500.0000 mg | Freq: Four times a day (QID) | INTRAVENOUS | Status: DC | PRN

## 2019-09-09 MED ORDER — 0.9 % SODIUM CHLORIDE (POUR BTL) OPTIME
TOPICAL | Status: DC | PRN
  Administered 2019-09-09: 1000 mL

## 2019-09-09 MED ORDER — ONDANSETRON HCL 4 MG/2ML IJ SOLN
INTRAMUSCULAR | Status: AC
  Filled 2019-09-09: qty 2

## 2019-09-09 MED ORDER — HYDROCODONE-ACETAMINOPHEN 5-325 MG PO TABS
1.0000 | ORAL_TABLET | ORAL | Status: DC | PRN
  Administered 2019-09-09 – 2019-09-10 (×3): 1 via ORAL
  Filled 2019-09-09 (×3): qty 1

## 2019-09-09 MED ORDER — SODIUM CHLORIDE 0.9 % IR SOLN
Status: DC | PRN
  Administered 2019-09-09: 3000 mL

## 2019-09-09 MED ORDER — DIPHENHYDRAMINE HCL 12.5 MG/5ML PO ELIX
12.5000 mg | ORAL_SOLUTION | ORAL | Status: DC | PRN
  Filled 2019-09-09: qty 10

## 2019-09-09 MED ORDER — PROPOFOL 10 MG/ML IV BOLUS
INTRAVENOUS | Status: DC | PRN
  Administered 2019-09-09: 150 mg via INTRAVENOUS

## 2019-09-09 MED ORDER — DEXAMETHASONE SODIUM PHOSPHATE 10 MG/ML IJ SOLN
INTRAMUSCULAR | Status: DC | PRN
  Administered 2019-09-09: 10 mg via INTRAVENOUS

## 2019-09-09 MED ORDER — CEFAZOLIN SODIUM-DEXTROSE 2-4 GM/100ML-% IV SOLN
2.0000 g | INTRAVENOUS | Status: AC
  Administered 2019-09-09: 2 g via INTRAVENOUS
  Filled 2019-09-09: qty 100

## 2019-09-09 MED ORDER — HYDROMORPHONE HCL 1 MG/ML IJ SOLN: 0.2500 mg | INTRAMUSCULAR | Status: DC | PRN

## 2019-09-09 MED ORDER — FENTANYL CITRATE (PF) 250 MCG/5ML IJ SOLN
INTRAMUSCULAR | Status: DC | PRN
  Administered 2019-09-09 (×2): 50 ug via INTRAVENOUS

## 2019-09-09 MED ORDER — EPHEDRINE SULFATE-NACL 50-0.9 MG/10ML-% IV SOSY
PREFILLED_SYRINGE | INTRAVENOUS | Status: DC | PRN
  Administered 2019-09-09: 5 mg via INTRAVENOUS
  Administered 2019-09-09: 10 mg via INTRAVENOUS
  Administered 2019-09-09 (×3): 5 mg via INTRAVENOUS

## 2019-09-09 MED ORDER — LIDOCAINE 2% (20 MG/ML) 5 ML SYRINGE
INTRAMUSCULAR | Status: AC
  Filled 2019-09-09: qty 10

## 2019-09-09 MED ORDER — ONDANSETRON HCL 4 MG/2ML IJ SOLN
INTRAMUSCULAR | Status: DC | PRN
  Administered 2019-09-09: 4 mg via INTRAVENOUS

## 2019-09-09 MED ORDER — SENNOSIDES-DOCUSATE SODIUM 8.6-50 MG PO TABS: 1.0000 | ORAL_TABLET | Freq: Every evening | ORAL | Status: DC | PRN

## 2019-09-09 MED ORDER — TRANEXAMIC ACID-NACL 1000-0.7 MG/100ML-% IV SOLN
1000.0000 mg | INTRAVENOUS | Status: AC
  Administered 2019-09-09: 1000 mg via INTRAVENOUS
  Filled 2019-09-09: qty 100

## 2019-09-09 MED ORDER — TRANEXAMIC ACID 1000 MG/10ML IV SOLN
INTRAVENOUS | Status: DC | PRN
  Administered 2019-09-09: 08:00:00 2000 mg via TOPICAL

## 2019-09-09 MED ORDER — PROPOFOL 10 MG/ML IV BOLUS
INTRAVENOUS | Status: AC
  Filled 2019-09-09: qty 20

## 2019-09-09 MED ORDER — CEFAZOLIN SODIUM-DEXTROSE 1-4 GM/50ML-% IV SOLN
1.0000 g | Freq: Three times a day (TID) | INTRAVENOUS | Status: AC
  Administered 2019-09-09 – 2019-09-10 (×3): 1 g via INTRAVENOUS
  Filled 2019-09-09 (×3): qty 50

## 2019-09-09 MED ORDER — PROMETHAZINE HCL 25 MG/ML IJ SOLN: 6.2500 mg | INTRAMUSCULAR | Status: DC | PRN

## 2019-09-09 MED ORDER — DEXAMETHASONE SODIUM PHOSPHATE 10 MG/ML IJ SOLN
INTRAMUSCULAR | Status: AC
  Filled 2019-09-09: qty 1

## 2019-09-09 MED ORDER — ONDANSETRON HCL 4 MG/2ML IJ SOLN
INTRAMUSCULAR | Status: AC
  Filled 2019-09-09: qty 4

## 2019-09-09 MED ORDER — SODIUM CHLORIDE 0.9 % IV SOLN
INTRAVENOUS | Status: DC | PRN
  Administered 2019-09-09: 75 ug/min via INTRAVENOUS

## 2019-09-09 MED ORDER — MIDAZOLAM HCL 5 MG/5ML IJ SOLN
INTRAMUSCULAR | Status: DC | PRN
  Administered 2019-09-09: 1 mg via INTRAVENOUS

## 2019-09-09 MED ORDER — FENTANYL CITRATE (PF) 250 MCG/5ML IJ SOLN
INTRAMUSCULAR | Status: AC
  Filled 2019-09-09: qty 5

## 2019-09-09 MED ORDER — OXYCODONE HCL 5 MG/5ML PO SOLN: 5.0000 mg | Freq: Once | ORAL | Status: DC | PRN

## 2019-09-09 MED ORDER — LIDOCAINE 2% (20 MG/ML) 5 ML SYRINGE
INTRAMUSCULAR | Status: AC
  Filled 2019-09-09: qty 5

## 2019-09-09 MED ORDER — LIDOCAINE 2% (20 MG/ML) 5 ML SYRINGE
INTRAMUSCULAR | Status: DC | PRN
  Administered 2019-09-09: 60 mg via INTRAVENOUS

## 2019-09-09 MED ORDER — METHYLENE BLUE 0.5 % INJ SOLN
INTRAVENOUS | Status: AC
  Filled 2019-09-09: qty 10

## 2019-09-09 MED ORDER — MORPHINE SULFATE (PF) 2 MG/ML IV SOLN: 0.5000 mg | INTRAVENOUS | Status: DC | PRN

## 2019-09-09 MED ORDER — DEXAMETHASONE SODIUM PHOSPHATE 10 MG/ML IJ SOLN
INTRAMUSCULAR | Status: AC
  Filled 2019-09-09: qty 2

## 2019-09-09 MED ORDER — HYDROCODONE-ACETAMINOPHEN 7.5-325 MG PO TABS: 1.0000 | ORAL_TABLET | ORAL | Status: DC | PRN

## 2019-09-09 MED ORDER — LACTATED RINGERS IV SOLN
INTRAVENOUS | Status: DC | PRN
  Administered 2019-09-09: 07:00:00 via INTRAVENOUS

## 2019-09-09 MED ORDER — MIDAZOLAM HCL 2 MG/2ML IJ SOLN
INTRAMUSCULAR | Status: AC
  Filled 2019-09-09: qty 2

## 2019-09-09 MED ORDER — ONDANSETRON HCL 4 MG PO TABS: 4.0000 mg | ORAL_TABLET | Freq: Four times a day (QID) | ORAL | Status: DC | PRN

## 2019-09-09 MED ORDER — ONDANSETRON HCL 4 MG/2ML IJ SOLN: 4.0000 mg | Freq: Four times a day (QID) | INTRAMUSCULAR | Status: DC | PRN

## 2019-09-09 MED ORDER — ROCURONIUM BROMIDE 100 MG/10ML IV SOLN
INTRAVENOUS | Status: DC | PRN
  Administered 2019-09-09: 50 mg via INTRAVENOUS

## 2019-09-09 MED ORDER — PHENYLEPHRINE 40 MCG/ML (10ML) SYRINGE FOR IV PUSH (FOR BLOOD PRESSURE SUPPORT)
PREFILLED_SYRINGE | INTRAVENOUS | Status: DC | PRN
  Administered 2019-09-09 (×3): 80 ug via INTRAVENOUS

## 2019-09-09 SURGICAL SUPPLY — 79 items
AID PSTN UNV HD RSTRNT DISP (MISCELLANEOUS) ×1
APL PRP STRL LF DISP 70% ISPRP (MISCELLANEOUS) ×1
BASEPLATE GLENIOD RSA 28 (Shoulder) ×1 IMPLANT
BASEPLATE GLENIOD RSA 28MM (Shoulder) ×1 IMPLANT
BIT DRILL 3.1 DIA REUNION (BIT) ×1 IMPLANT
BIT DRILL 3.1MM DIA REUNION (BIT) ×1
BIT DRILL 5/64X5 DISP (BIT) ×3 IMPLANT
BLADE SAW SAG 73X25 THK (BLADE) ×2
BLADE SAW SGTL 73X25 THK (BLADE) ×1 IMPLANT
BLADE SURG 15 STRL LF DISP TIS (BLADE) ×1 IMPLANT
BLADE SURG 15 STRL SS (BLADE) ×3
BSPLAT GLND 28STRL LF SHLDR (Shoulder) ×1 IMPLANT
CHLORAPREP W/TINT 26 (MISCELLANEOUS) ×4 IMPLANT
CLOSURE STERI-STRIP 1/2X4 (GAUZE/BANDAGES/DRESSINGS) ×1
CLOSURE WOUND 1/2 X4 (GAUZE/BANDAGES/DRESSINGS) ×1
CLSR STERI-STRIP ANTIMIC 1/2X4 (GAUZE/BANDAGES/DRESSINGS) ×1 IMPLANT
CONT SPEC 4OZ CLIKSEAL STRL BL (MISCELLANEOUS) ×2 IMPLANT
COVER SURGICAL LIGHT HANDLE (MISCELLANEOUS) ×3 IMPLANT
COVER WAND RF STERILE (DRAPES) ×3 IMPLANT
CUP HUMERAL RSA REUNION 32X4MM (Shoulder) ×2 IMPLANT
DRAPE INCISE IOBAN 66X45 STRL (DRAPES) ×3 IMPLANT
DRAPE ORTHO SPLIT 77X108 STRL (DRAPES) ×6
DRAPE SURG 17X23 STRL (DRAPES) ×3 IMPLANT
DRAPE SURG ORHT 6 SPLT 77X108 (DRAPES) ×2 IMPLANT
DRAPE U-SHAPE 47X51 STRL (DRAPES) ×6 IMPLANT
DRSG AQUACEL AG ADV 3.5X10 (GAUZE/BANDAGES/DRESSINGS) ×3 IMPLANT
ELECT BLADE 4.0 EZ CLEAN MEGAD (MISCELLANEOUS)
ELECT REM PT RETURN 9FT ADLT (ELECTROSURGICAL) ×3
ELECTRODE BLDE 4.0 EZ CLN MEGD (MISCELLANEOUS) IMPLANT
ELECTRODE REM PT RTRN 9FT ADLT (ELECTROSURGICAL) ×1 IMPLANT
GAUZE SPONGE 4X4 12PLY STRL LF (GAUZE/BANDAGES/DRESSINGS) IMPLANT
GLENOID CONC REUNION 32X6 (Shoulder) ×2 IMPLANT
GLOVE BIOGEL PI IND STRL 8 (GLOVE) ×1 IMPLANT
GLOVE BIOGEL PI INDICATOR 8 (GLOVE) ×2
GLOVE SURG SYN 7.5  E (GLOVE) ×2
GLOVE SURG SYN 7.5 E (GLOVE) ×1 IMPLANT
GLOVE SURG SYN 7.5 PF PI (GLOVE) ×1 IMPLANT
GOWN STRL REUS W/ TWL LRG LVL3 (GOWN DISPOSABLE) ×2 IMPLANT
GOWN STRL REUS W/ TWL XL LVL3 (GOWN DISPOSABLE) ×1 IMPLANT
GOWN STRL REUS W/TWL LRG LVL3 (GOWN DISPOSABLE) ×6
GOWN STRL REUS W/TWL XL LVL3 (GOWN DISPOSABLE) ×3
GUIDEWIRE SHLD REUNION 19 (WIRE) ×2 IMPLANT
HANDPIECE INTERPULSE COAX TIP (DISPOSABLE) ×3
HEMOSTAT SURGICEL 2X14 (HEMOSTASIS) IMPLANT
INSERT HUM REUNION 32X6 (Insert) ×2 IMPLANT
KIT BASIN OR (CUSTOM PROCEDURE TRAY) ×3 IMPLANT
KIT TURNOVER KIT B (KITS) ×3 IMPLANT
MANIFOLD NEPTUNE II (INSTRUMENTS) ×3 IMPLANT
PACK SHOULDER (CUSTOM PROCEDURE TRAY) ×3 IMPLANT
PAD ARMBOARD 7.5X6 YLW CONV (MISCELLANEOUS) ×6 IMPLANT
RESTRAINT HEAD UNIVERSAL NS (MISCELLANEOUS) ×3 IMPLANT
RETRIEVER SUT HEWSON (MISCELLANEOUS) ×2 IMPLANT
SCREW CENTER PERIPH 6.5X28 (Screw) ×2 IMPLANT
SCREW PERIPHERAL 4.5X16MM (Screw) ×2 IMPLANT
SCREW PERIPHERAL 4.5X24MM (Screw) ×2 IMPLANT
SCREW PERIPHERAL 4.5X28MM (Screw) ×2 IMPLANT
SET HNDPC FAN SPRY TIP SCT (DISPOSABLE) ×1 IMPLANT
SLING ARM IMMOBILIZER LRG (SOFTGOODS) ×3 IMPLANT
SLING ARM IMMOBILIZER MED (SOFTGOODS) ×2 IMPLANT
SMARTMIX MINI TOWER (MISCELLANEOUS)
SPONGE LAP 18X18 RF (DISPOSABLE) ×3 IMPLANT
SPONGE LAP 4X18 RFD (DISPOSABLE) IMPLANT
STEM HUM PF RSA REUNION 10X94 (Stem) ×2 IMPLANT
STRIP CLOSURE SKIN 1/2X4 (GAUZE/BANDAGES/DRESSINGS) ×2 IMPLANT
SUCTION FRAZIER HANDLE 10FR (MISCELLANEOUS) ×2
SUCTION TUBE FRAZIER 10FR DISP (MISCELLANEOUS) ×1 IMPLANT
SUPPORT WRAP ARM LG (MISCELLANEOUS) ×3 IMPLANT
SUT FIBERWIRE #2 38 T-5 BLUE (SUTURE) ×6
SUT MNCRL AB 3-0 PS2 18 (SUTURE) ×3 IMPLANT
SUT VIC AB 0 CT1 27 (SUTURE) ×3
SUT VIC AB 0 CT1 27XBRD ANBCTR (SUTURE) ×1 IMPLANT
SUT VIC AB 1 CT1 27 (SUTURE)
SUT VIC AB 1 CT1 27XBRD ANBCTR (SUTURE) IMPLANT
SUT VIC AB 2-0 CT1 27 (SUTURE) ×3
SUT VIC AB 2-0 CT1 TAPERPNT 27 (SUTURE) ×1 IMPLANT
SUTURE FIBERWR #2 38 T-5 BLUE (SUTURE) ×2 IMPLANT
TOWEL GREEN STERILE (TOWEL DISPOSABLE) ×3 IMPLANT
TOWER SMARTMIX MINI (MISCELLANEOUS) IMPLANT
YANKAUER SUCT BULB TIP NO VENT (SUCTIONS) ×2 IMPLANT

## 2019-09-09 NOTE — H&P (Signed)
ORTHOPAEDIC H&P  PCP:  Joycelyn Rua, MD  Chief Complaint: Right shoulder plan  HPI: Elizabeth [name] is a 78 y.o. female who complains of right shoulder pain.  She was evaluated by me in clinic and had severe right shoulder arthritis.  She underwent extensive nonoperative treatment over the past several years with use of anti-inflammatories, activity modification and pain management.  Despite this she had continued right shoulder pain that limited her ability to use and advised.  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  After discussing additional treatment options she did wish to proceed with right shoulder reverse arthroplasty presents today  Past Medical History:  Diagnosis Date  . Allergic rhinitis   . Anxiety   . Arthritis    right shoulder  . Congenital defect    left arm  . Depression   . GERD (gastroesophageal reflux disease)   . Headache   . Hyperlipemia   . Hypertension   . IBS (irritable bowel syndrome)   . Insomnia   . Memory loss   . Vitamin D deficiency   . Wears glasses    Past Surgical History:  Procedure Laterality Date  . ABDOMINAL HYSTERECTOMY    . CATARACT EXTRACTION Bilateral   . NECK SURGERY     Social History   Socioeconomic History  . Marital status: Widowed    Spouse name: Not on file  . Number of children: 1  . Years of education: HS  . Highest education level: Not on file  Occupational History  . Occupation: Retired  Engineer, production  . Financial resource strain: Not on file  . Food insecurity    Worry: Not on file    Inability: Not on file  . Transportation needs    Medical: Not on file    Non-medical: Not on file  Tobacco Use  . Smoking status: Never Smoker  . Smokeless tobacco: Never Used  Substance and Sexual Activity  . Alcohol use: No  . Drug use: No  . Sexual activity: Not on file  Lifestyle  . Physical activity    Days per week: Not on file    Minutes per  session: Not on file  . Stress: Not on file  Relationships  . Social Musician on phone: Not on file    Gets together: Not on file    Attends religious service: Not on file    Active member of club or organization: Not on file    Attends meetings of clubs or organizations: Not on file    Relationship status: Not on file  Other Topics Concern  . Not on file  Social History Narrative   Lives at [facility] at this time.  Daughter Erskine Squibb and Victorino Dike.    Right-handed.   No caffeine use.   Family History  Adopted: Yes  Problem Relation Age of Onset  . Breast cancer Mother   . Other Father        unsure of history   Allergies  Allergen Reactions  . Norvasc [Amlodipine] Swelling    Pt not aware of   . Benicar [Olmesartan] Other (See Comments)    Nervousness Pt not aware of   . Lisinopril Cough    Pt not aware of    Prior to Admission medications   Medication Sig Start Date End Date Taking? Authorizing Provider  cyclobenzaprine (FLEXERIL) 5 MG tablet Take 1 tablet (5  mg total) by mouth 2 (two) times daily as needed for muscle spasms. Patient taking differently: Take 5 mg by mouth 2 (two) times daily.  07/04/19  Yes McCue, Janett Billow, NP  diphenhydramine-acetaminophen (TYLENOL PM) 25-500 MG TABS tablet Take 3 tablets by mouth at bedtime.   Yes [provider]  metoprolol succinate (TOPROL-XL) 50 MG 24 hr tablet Take 75 mg by mouth 2 (two) times daily. Take with or immediately following a meal.   Yes [provider]  DULoxetine (CYMBALTA) 60 MG capsule Take 1 capsule (60 mg total) by mouth daily. 07/04/19   Frann Rider, NP  polyethylene glycol (MIRALAX / GLYCOLAX) packet Take 17 g by mouth daily. Patient not taking: Reported on 08/29/2019 05/04/17   Geradine Girt, DO   No results found.  Positive ROS: All other systems have been reviewed and were otherwise negative with the exception of those mentioned in the HPI and as above.  Physical  Exam: General: Alert, no acute distress Cardiovascular: No pedal edema Respiratory: No cyanosis, no use of accessory musculature Skin: No lesions in the area of chief complaint Psychiatric: Patient is competent for consent with normal mood and affect Lymphatic: No axillary or cervical lymphadenopathy  MUSCULOSKELETAL: Inspection Right: no atrophy, erythema, or swelling and normal AC joint. Bony Palpation Right: no tenderness of the sternoclavicular joint, the clavicle, the coracoid process, the acromioclavicular joint, the acromial, or the scapula and tenderness of the greater tuberosity and the bicipital groove; Anterior and posterior joint line tenderness. Active Range of Motion Right: limited, forward flexion (90 deg.), external rotation at 0 deg. of abduction (15 deg.), and internal rotation (pocket deg.). Passive Range of Motion Right: limited (and with crepitus). Special Tests Right: Hawkin's test positive and Neer's test positive. Strength Right: 4/5 strength to the supraspinatus, infraspinatus and subscapularis and with pain.  Assessment: Right shoulder severe glenohumeral arthritis  Plan: -OR today for right shoulder arthroplasty -The risks, benefits and alternatives of surgery were discussed with her in clinic which include but not damage to surrounding structures including blood vessels nerves, pain, stiffness, implant failure, need for additional procedures.  Consent was obtained.  The right shoulder was marked. -Admission overnight for observation and discharge in the morning    Verner Mould, MD [phone]   09/09/2019 7:11 AM

## 2019-09-09 NOTE — Progress Notes (Signed)
NCM received consult : Home health needs. PT/OT evaluations pending. NCM to f/u with TOC needs. Whitman Hero RN,BSN,CM [phone]

## 2019-09-09 NOTE — Op Note (Signed)
PREOPERATIVE DIAGNOSIS: Severe right shoulder osteoarthritis  POSTOPERATIVE DIAGNOSIS: Same  ATTENDING PHYSICIAN: Gasper Lloyd. Arthur Holms, MD who was present and scrubbed for the entire case   ASSISTANT SURGEON: April Green, FNA was required throughout the surgery for positioning, retractors and visualization  ANESTHESIA: Regional with general  SURGICAL PROCEDURES: Reverse shoulder arthroplasty  SURGICAL INDICATIONS: Patient is a 78 year old female who was seen and evaluated by me in clinic.  She had a long history of right shoulder pain and was found to have severe right shoulder glenohumeral osteoarthritis.  She had years of pain as well as dysfunction of the right arm and treated it extensively with anti-inflammatories, pain medication and activity modification.  Despite this she had continued pain and dysfunction to the right arm.  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  After discussing treatment options extensively with her she did wish to proceed with right shoulder reverse arthroplasty to minimize her downtime with the extremity.  FINDING: There is severe arthritis of the glenohumeral joint with large osteophyte formation on both the humeral head as well as the glenoid.  There were multiple loose bodies.  Stable reverse shoulder arthroplasty was accomplished with a Stryker reunion S system.  DESCRIPTION OF PROCEDURE: The patient was identified in the preoperative holding area where the risk benefits and alternatives of the procedure were discussed with the patient.  These include but are not limited to infection, bleeding, damage to surrounding structures including blood vessels and nerves, pain, stiffness, implant failure, dislocation and need for additional procedures.  Informed consent was obtained at that time the patient's right shoulder was marked with a surgical marking pen.  She then underwent a right upper extremity  plexus block by anesthesia.  She was then brought to the operative suite where timeout was performed identifying the correct patient operative site.  She was positioned supine on the operative table and induced under general anesthesia.  Once intubated she was positioned upright in the beachchair position and all bony prominences were well-padded.  The right upper extremity was then prepped and draped in usual sterile fashion.  A deltopectoral approach was then utilized.  A longitudinal incision was made centered along the deltopectoral groove.  Subcutaneous dissection was carried down through utilizing Bovie electrocautery.  The deltopectoral interval was identified and split.  The vein was extremely Friable so the decision made to just cauterize this to prevent intraoperative bleeding.  The subdeltoid space was established and retractors placed.  Lateral to the conjoined tendon the fascia was opened and retractors were placed as well.  The proximal shoulder was then visualized and the bicipital groove was identified.  The biceps tendon was tenodesed to the to the pec utilizing a #2 FiberWire.  The bicipital groove was then opened fully back to the level of the glenoid and the proximal portion of the bicep was excised.  The subscapularis tendon was then identified and peeled off of the anterior aspect of the proximal humerus.  Retractors were placed and the shoulder was dislocated.  There were multiple, large inferior osteophytes of the humeral head which were removed with a rondure and osteotome.  Once full dislocation of the shoulder had been achieved the external 135 degree neck shaft angle guide was pinned in place in appropriate position and the head was resected.  The canal finding guide was then placed followed by reamers sequentially up to the size 10 reamer which had excellent purchase within the proximal  humerus.  The humerus was broached starting at a size 7 up to a size 9 which had adequate fixation.   All retractors were removed.  Attention was then turned to the glenoid preparation.  Retractors were placed in the anterior and posterior aspect as well as superior margin of the glenoid.  Remaining soft tissues around the glenoid surface were removed utilizing a Coker and Bovie electrocautery.  There were some anterior and inferior osteophytes on the glenoid which were removed with a rondure.  There were also some loose bodies in the subscapularis space.  Once adequate visualization of the glenoid had been achieved the guidepin was placed in the inferior aspect of the glenoid.  Once appropriate positioning and alignment of this was achieved the glenoid was reamed back to bleeding bone with more resected on inferior margin of the glenoid.  The wound was then copiously irrigated with normal saline.  The central pin measured to be approximately 24 mm so 28 mm screw was inserted through the baseplate which had excellent purchase and fixation.  Utilizing central screw the scapula was able to be rotated without motion through the baseplate.  Superior, inferior and posterior locking screws were then placed of appropriate lengths.  The 32+6 glenosphere was then impacted and secured along the baseplate.  All retractors were then removed and attention was turned back to the proximal humerus.  Trial humeral tray and cup were placed onto the humeral stem and the shoulder was reduced.  This was found to be most stable with a 4 tray and 6 poly-.  All trial components were removed and the size 10 Reunion S stem was impacted with secure fixation.  The final size 4 tray and 6 constrained poly-were then impacted and the shoulder was reduced.  The shoulder had excellent range of motion without evidence of impingement.  At this point the wound was copiously irrigated.  Topical TXA was placed within the wound allowed to rest for several minutes.  Following that topical vancomycin powder was placed within the wound and the  deltopectoral interval was closed with a running 0 Vicryl suture.  Skin was closed with deep 3-0 Vicryl followed by running 4 Monocryl suture.  Steri-Strips and a Aquasol dressing were then placed.  The arm was placed into a shoulder immobilizer.  The patient was awoken from anesthesia and extubated in the operating room without a complications.  She was taken to the PACU in stable condition.  She tolerated the procedure well and there are no complications.  ESTIMATED BLOOD LOSS: 200 mL's  TOURNIQUET TIME: None  SPECIMENS: None  POSTOPERATIVE PLAN: The patient will be discharged home and seen back  in the office in approximately 2 weeks for wound check and radiographs.  If she is doing well at that point we can slowly wean her out of her sling and start therapy at 6 weeks postoperatively.  IMPLANTS: Stryker reunion S reverse system with size 10 stem, 4 tray and 6 constrained poly-.  28 mm baseplate with 24, 28 and 16 mm screws.  32+6 glenosphere.

## 2019-09-09 NOTE — Anesthesia Preprocedure Evaluation (Signed)
Anesthesia Evaluation  Patient identified by MRN, date of birth, ID band Patient awake    Reviewed: Allergy & Precautions, NPO status , Patient's Chart, lab work & pertinent test results, reviewed documented beta blocker date and time   Airway Mallampati: II  TM Distance: >3 FB Neck ROM: Full    Dental no notable dental hx.    Pulmonary neg pulmonary ROS,    Pulmonary exam normal breath sounds clear to auscultation       Cardiovascular hypertension, Pt. on medications and Pt. on home beta blockers negative cardio ROS Normal cardiovascular exam Rhythm:Regular Rate:Normal     Neuro/Psych  Headaches, negative psych ROS   GI/Hepatic Neg liver ROS, GERD  ,  Endo/Other  negative endocrine ROS  Renal/GU negative Renal ROS  negative genitourinary   Musculoskeletal  (+) Arthritis , Osteoarthritis,    Abdominal   Peds negative pediatric ROS (+)  Hematology negative hematology ROS (+)   Anesthesia Other Findings   Reproductive/Obstetrics negative OB ROS                             Anesthesia Physical Anesthesia Plan  ASA: II  Anesthesia Plan: General   Post-op Pain Management:  Regional for Post-op pain   Induction: Intravenous  PONV Risk Score and Plan: 3 and Ondansetron, Dexamethasone, Midazolam and Treatment may vary due to age or medical condition  Airway Management Planned: Oral ETT  Additional Equipment:   Intra-op Plan:   Post-operative Plan: Extubation in OR  Informed Consent: I have reviewed the patients History and Physical, chart, labs and discussed the procedure including the risks, benefits and alternatives for the proposed anesthesia with the patient or authorized representative who has indicated his/her understanding and acceptance.     Dental advisory given  Plan Discussed with: CRNA  Anesthesia Plan Comments:         Anesthesia Quick Evaluation

## 2019-09-09 NOTE — Progress Notes (Signed)
NCM spoke with pt and wife @ bedside regarding TOC needs. Pt declined home health services. States will have 24/7 supervision/ assistance once d/c. Whitman Hero RN,BSN,CM [phone]

## 2019-09-09 NOTE — Anesthesia Postprocedure Evaluation (Signed)
Anesthesia Post Note  Patient: Elizabeth [name]  Procedure(s) Performed: REVERSE SHOULDER ARTHROPLASTY (Right Shoulder)     Patient location during evaluation: PACU Anesthesia Type: General Level of consciousness: awake and alert Pain management: pain level controlled Vital Signs Assessment: post-procedure vital signs reviewed and stable Respiratory status: spontaneous breathing, nonlabored ventilation and respiratory function stable Cardiovascular status: blood pressure returned to baseline and stable Postop Assessment: no apparent nausea or vomiting Anesthetic complications: no    Last Vitals:  Vitals:   09/09/19 1109 09/09/19 1123  BP: 127/61 135/64  Pulse: 72 72  Resp: 18 17  Temp:    SpO2: 94% 94%    Last Pain:  Vitals:   09/09/19 1123  PainSc: 0-No pain                 Lynda Rainwater

## 2019-09-09 NOTE — Transfer of Care (Signed)
Immediate Anesthesia Transfer of Care Note  Patient: Elizabeth [name]  Procedure(s) Performed: REVERSE SHOULDER ARTHROPLASTY (Right Shoulder)  Patient Location: PACU  Anesthesia Type:General  Level of Consciousness: drowsy  Airway & Oxygen Therapy: Patient Spontanous Breathing and Patient connected to face mask oxygen  Post-op Assessment: Report given to RN and Post -op Vital signs reviewed and stable  Post vital signs: Reviewed  Last Vitals:  Vitals Value Taken Time  BP 140/40 09/09/19 0938  Temp    Pulse 71 09/09/19 0942  Resp 17 09/09/19 0942  SpO2 100 % 09/09/19 0942  Vitals shown include unvalidated device data.  Last Pain:  Vitals:   09/09/19 0617  PainSc: 0-No pain         Complications: No apparent anesthesia complications

## 2019-09-09 NOTE — Anesthesia Procedure Notes (Signed)
Anesthesia Regional Block: Supraclavicular block   Pre-Anesthetic Checklist: ,, timeout performed, Correct Patient, Correct Site, Correct Laterality, Correct Procedure, Correct Position, site marked, Risks and benefits discussed,  Surgical consent,  Pre-op evaluation,  At surgeon's request and post-op pain management  Laterality: Right  Prep: chloraprep       Needles:  Injection technique: Single-shot  Needle Type: Stimiplex     Needle Length: 9cm  Needle Gauge: 21     Additional Needles:   Procedures:,,,, ultrasound used (permanent image in chart),,,,  Narrative:  Start time: 09/09/2019 7:16 AM End time: 09/09/2019 7:21 AM Injection made incrementally with aspirations every 5 mL.  Performed by: Personally  Anesthesiologist: Lynda Rainwater, MD

## 2019-09-09 NOTE — Evaluation (Signed)
Physical Therapy Evaluation Patient Details Name: Elizabeth [name] MRN: [id number] DOB: [birth year] Today's Date: 09/09/2019   History of Present Illness  78yo female s/p R reverse total shoulder arthoroplasty done 09/09/19. PMH L UE congenital defect, HTN, IBS, neck surgery, anxiety, mild dementia, dizziness  Clinical Impression   Patient received in bed, pleasant and willing to work with therapist; note some expressive difficulties as well as possible HOH that did reduce quality of communication during this session, family also not available to assist in providing information. Able to complete supine to sit with ModA to maintain NWB R UE, then required Min guard for functional transfers and gait approximately 69f with min guard and no device today. Note some impulsivity requiring close min guard and cues to maintain safety throughout mobility. Able to perform sit to supine with min guard and assist for line management. She was left in bed with all needs met, bed alarm active this afternoon. She will continue to benefit from skilled PT services in the acute setting, currently recommending HHPT with 24/7 assist, however would recommend SNF if reliable 24/7 assist is not available from family.     Follow Up Recommendations Home health PT;Supervision/Assistance - 24 hour;Other (comment)(if reliable 24/7 assist is not available at home, would recommend SNF)    Equipment Recommendations  3in1 (PT)    Recommendations for Other Services       Precautions / Restrictions Precautions Precautions: Shoulder Type of Shoulder Precautions: NWB R UE, R UE in sling; congenital deformity L UE Shoulder Interventions: Shoulder sling/immobilizer;At all times Precaution Booklet Issued: No Restrictions Weight Bearing Restrictions: Yes RUE Weight Bearing: Non weight bearing      Mobility  Bed Mobility Overal bed mobility: Needs Assistance Bed Mobility: Supine to Sit;Sit to Supine     Supine to sit:  Mod assist Sit to supine: Min guard   General bed mobility comments: ModA to bring LEs around and trunk to upright while maintaining NWB R UE; min guard and assist for line management with return to supine  Transfers Overall transfer level: Needs assistance Equipment used: None Transfers: Sit to/from Stand Sit to Stand: Min guard         General transfer comment: Min guard for safety, no physical assist given  Ambulation/Gait Ambulation/Gait assistance: Min guard Gait Distance (Feet): 80 Feet Assistive device: None Gait Pattern/deviations: Step-through pattern;Decreased step length - right;Decreased step length - left;Decreased stride length;Drifts right/left Gait velocity: decreased   General Gait Details: min guard for safety and due to mild unsteadiness with gait  Stairs            Wheelchair Mobility    Modified Rankin (Stroke Patients Only)       Balance Overall balance assessment: Needs assistance Sitting-balance support: No upper extremity supported;Feet supported Sitting balance-Leahy Scale: Good     Standing balance support: Single extremity supported;During functional activity Standing balance-Leahy Scale: Fair                               Pertinent Vitals/Pain Pain Assessment: Faces Faces Pain Scale: Hurts a little bit Pain Location: R shoulder Pain Descriptors / Indicators: Aching;Discomfort Pain Intervention(s): Limited activity within patient's tolerance;Monitored during session;Repositioned    Home Living Family/patient expects to be discharged to:: Private residence Living Arrangements: Children Available Help at Discharge: Family;Available 24 hours/day(son is not a reliable caregiver per prior charting) Type of Home: House Home Access: Level entry  Home Layout: One level Home Equipment: None      Prior Function Level of Independence: Independent               Hand Dominance   Dominant Hand: Right     Extremity/Trunk Assessment   Upper Extremity Assessment Upper Extremity Assessment: Defer to OT evaluation    Lower Extremity Assessment Lower Extremity Assessment: Generalized weakness    Cervical / Trunk Assessment Cervical / Trunk Assessment: Normal  Communication   Communication: Expressive difficulties;HOH  Cognition Arousal/Alertness: Awake/alert Behavior During Therapy: WFL for tasks assessed/performed;Impulsive Overall Cognitive Status: Within Functional Limits for tasks assessed                                        General Comments      Exercises     Assessment/Plan    PT Assessment Patient needs continued PT services  PT Problem List Decreased strength;Decreased mobility;Decreased safety awareness;Decreased coordination;Decreased knowledge of precautions;Decreased activity tolerance;Decreased balance;Pain       PT Treatment Interventions DME instruction;Therapeutic activities;Gait training;Therapeutic exercise;Patient/family education;Balance training    PT Goals (Current goals can be found in the Care Plan section)  Acute Rehab PT Goals Patient Stated Goal: go home PT Goal Formulation: With patient Time For Goal Achievement: 09/23/19 Potential to Achieve Goals: Good    Frequency Min 3X/week   Barriers to discharge Decreased caregiver support per prior charting, son is unreliable caregiver    Co-evaluation               AM-PAC PT "6 Clicks" Mobility  Outcome Measure Help needed turning from your back to your side while in a flat bed without using bedrails?: A Little Help needed moving from lying on your back to sitting on the side of a flat bed without using bedrails?: A Lot Help needed moving to and from a bed to a chair (including a wheelchair)?: A Little Help needed standing up from a chair using your arms (e.g., wheelchair or bedside chair)?: A Little Help needed to walk in hospital room?: A Little Help needed climbing  3-5 steps with a railing? : A Lot 6 Click Score: 16    End of Session Equipment Utilized During Treatment: Other (comment)(R UE sling) Activity Tolerance: Patient tolerated treatment well Patient left: in bed;with call bell/phone within reach;with bed alarm set Nurse Communication: Mobility status PT Visit Diagnosis: Muscle weakness (generalized) (M62.81);Unsteadiness on feet (R26.81);Difficulty in walking, not elsewhere classified (R26.2);Pain Pain - Right/Left: Right Pain - part of body: Shoulder    Time: 1315-1340 PT Time Calculation (min) (ACUTE ONLY): 25 min   Charges:   PT Evaluation $PT Eval Moderate Complexity: 1 Mod PT Treatments $Gait Training: 8-22 mins        Deniece Ree PT, DPT, CBIS  Supplemental Physical Therapist [place]    Pager [phone] Acute Rehab Office [phone]

## 2019-09-09 NOTE — Anesthesia Procedure Notes (Signed)
Procedure Name: Intubation Date/Time: 09/09/2019 7:39 AM Performed by: Janene Harvey, CRNA Pre-anesthesia Checklist: Patient identified, Emergency Drugs available, Suction available and Patient being monitored Patient Re-evaluated:Patient Re-evaluated prior to induction Oxygen Delivery Method: Circle system utilized Preoxygenation: Pre-oxygenation with 100% oxygen Induction Type: IV induction Ventilation: Mask ventilation without difficulty Laryngoscope Size: Mac and 4 Grade View: Grade I Tube type: Oral Tube size: 7.0 mm Number of attempts: 1 Airway Equipment and Method: Stylet and Oral airway Placement Confirmation: ETT inserted through vocal cords under direct vision,  positive ETCO2 and breath sounds checked- equal and bilateral Secured at: 22 cm Tube secured with: Tape Dental Injury: Teeth and Oropharynx as per pre-operative assessment

## 2019-09-10 ENCOUNTER — Encounter (HOSPITAL_COMMUNITY): Payer: Self-pay | Admitting: Orthopaedic Surgery

## 2019-09-10 LAB — CBC
HCT: 37.1 % (ref 36.0–46.0)
Hemoglobin: 11.9 g/dL — ABNORMAL LOW (ref 12.0–15.0)
MCH: 28.1 pg (ref 26.0–34.0)
MCHC: 32.1 g/dL (ref 30.0–36.0)
MCV: 87.5 fL (ref 80.0–100.0)
Platelets: 253 10*3/uL (ref 150–400)
RBC: 4.24 MIL/uL (ref 3.87–5.11)
RDW: 14.6 % (ref 11.5–15.5)
WBC: 11.5 10*3/uL — ABNORMAL HIGH (ref 4.0–10.5)
nRBC: 0 % (ref 0.0–0.2)

## 2019-09-10 LAB — BASIC METABOLIC PANEL
Anion gap: 9 (ref 5–15)
BUN: 17 mg/dL (ref 8–23)
CO2: 26 mmol/L (ref 22–32)
Calcium: 9.1 mg/dL (ref 8.9–10.3)
Chloride: 102 mmol/L (ref 98–111)
Creatinine, Ser: 0.84 mg/dL (ref 0.44–1.00)
GFR calc Af Amer: >60 mL/min (ref >60)
GFR calc non Af Amer: >60 mL/min (ref >60)
Glucose, Bld: 133 mg/dL — ABNORMAL HIGH (ref 70–99)
Potassium: 3.8 mmol/L (ref 3.5–5.1)
Sodium: 137 mmol/L (ref 135–145)

## 2019-09-10 MED ORDER — HYDROCODONE-ACETAMINOPHEN 5-325 MG PO TABS: 1.0000 | ORAL_TABLET | Freq: Four times a day (QID) | ORAL | 0 refills | Status: DC | PRN

## 2019-09-10 NOTE — Discharge Instructions (Signed)
Discharge Instructions  - Keep dressings in place for the next 5-7 days. You may shower over top of the current dressing. Once the surgical dressing has been removed, the incision may get wet as long as it is without drainage. Change dressing then daily with clean gauze and tape - Take all medication as prescribed. Transition to over the counter pain medication as your pain improves - Keep the shoulder elevated over the next 48-72 hours to help with pain and swelling - Ice the shoulder regularly to help with pain and swelling - Perform pendulum excercises to the shoulder 2-3 times a day. Be sure to move the elbow, wrist and fingers to prevent swelling and stiffness - Please call to schedule a follow up appointment with Dr. Othello Sgroi at [phone] for 10-14 days following surgery  

## 2019-09-10 NOTE — Evaluation (Addendum)
Occupational Therapy Evaluation Patient Details Name: Elizabeth [name] MRN: [id number] DOB: [birth year] Today's Date: 09/10/2019    History of Present Illness 77yo female s/p R reverse total shoulder arthoroplasty done 09/09/19. PMH L UE congenital defect, HTN, IBS, neck surgery, anxiety, mild dementia, dizziness   Clinical Impression   This 78 y/o female presents with the above. PTA pt independent with ADL and mobility. Pt performing functional mobility without AD at mod independence level today. Pt currently requires significant assist for performing ADL given RUE deficits and baseline LUE deficits, requiring maxA for UB ADL and at least modA for LB ADL. Question cognitive impairments as well as pt with decreased insight into deficits, poor safety awareness and difficulty processing/recalling education provided today. Reviewed shoulder precautions, sling management, HEP within pt's permissible limits as well as compensatory strategies for performing ADL after return home. Pt will ultimately require hands on 24hr supervision/assist to ensure adequate safety with completing ADL/mobility while maintaining shoulder precautions. Recommend additional HHOT services to maximize her safety/independence with ADL and mobility after discharge. Will follow up for an additional session once pt's son arrives as pt reports she will be returning home with him. If son unable to provide necessary assist pt may require [facility] SNF services. Will follow acutely.     Follow Up Recommendations  Home health OT;Supervision/Assistance - 24 hour(if no 24hr will need SNF)    Equipment Recommendations  None recommended by OT           Precautions / Restrictions Precautions Precautions: Shoulder Type of Shoulder Precautions: NWB R UE, R UE in sling (sling AAT except ADL, exercise), okay for pendulums; congenital deformity L UE Shoulder Interventions: Shoulder sling/immobilizer;At all times;Off for  dressing/bathing/exercises Precaution Booklet Issued: Yes (comment) Precaution Comments: issued and thoroughly reviewed Required Braces or Orthoses: Sling Restrictions Weight Bearing Restrictions: Yes RUE Weight Bearing: Non weight bearing      Mobility Bed Mobility Overal bed mobility: Needs Assistance Bed Mobility: Supine to Sit     Supine to sit: Mod assist     General bed mobility comments: assist for trunk elevation with pt using LUE to hood around therapist's arm  Transfers Overall transfer level: Modified independent Equipment used: None             General transfer comment: emphasis on maintaining NWB in RUE    Balance Overall balance assessment: No apparent balance deficits (not formally assessed)                                         ADL either performed or assessed with clinical judgement   ADL Overall ADL's : Needs assistance/impaired Eating/Feeding: Set up;Sitting   Grooming: Set up;Min guard;Standing;Sitting   Upper Body Bathing: Minimal assistance;Sitting   Lower Body Bathing: Moderate assistance;Sit to/from stand   Upper Body Dressing : Maximal assistance;Sitting Upper Body Dressing Details (indicate cue type and reason): pt already dressed upon entry; verbally reviewed UB dressing technique and practicing sling management Lower Body Dressing: Moderate assistance;Sit to/from stand   Toilet Transfer: Ambulation;Modified Independent   Toileting- ArchitectClothing Manipulation and Hygiene: Moderate assistance;Sit to/from Nurse, children'sstand     Tub/Shower Transfer Details (indicate cue type and reason): educated to sponge bathe initially for increased safety (and as pt typically takes a bath vs shower), pt verbalizing understanding Functional mobility during ADLs: Modified independent;Supervision/safety General ADL Comments: pt with significant difficulties performing necessary ADL tasks given  bil UE deficits; have concern over pt's  receptiveness/understanding of shoulder precautions     Vision         Perception     Praxis      Pertinent Vitals/Pain Pain Assessment: Faces Faces Pain Scale: Hurts little more Pain Location: R shoulder Pain Descriptors / Indicators: Aching;Discomfort Pain Intervention(s): Monitored during session;Repositioned;Limited activity within patient's tolerance     Hand Dominance Right   Extremity/Trunk Assessment Upper Extremity Assessment Upper Extremity Assessment: RUE deficits/detail;LUE deficits/detail RUE Deficits / Details: s/p reverse TSA RUE: Unable to fully assess due to immobilization RUE Coordination: decreased gross motor LUE Deficits / Details: baseline congenital deformity LUE Coordination: decreased fine motor   Lower Extremity Assessment Lower Extremity Assessment: Overall WFL for tasks assessed   Cervical / Trunk Assessment Cervical / Trunk Assessment: Normal   Communication Communication Communication: HOH   Cognition Arousal/Alertness: Awake/alert Behavior During Therapy: Impulsive;WFL for tasks assessed/performed Overall Cognitive Status: No family/caregiver present to determine baseline cognitive functioning                                 General Comments: pt impulsive and with increased difficulty processing/ following commands; decreased STM as pt with difficulty remembering compensatory ADL techniques after taught by therapist (attempting to use teach back method); pt with decreased safety awareness attempting to perform pendulums on rolling table   General Comments       Exercises Exercises: Shoulder;Hand exercises Shoulder Exercises Pendulum Exercise: PROM;Right;Standing(modified/dangle; mod cues for technique) Elbow Flexion: AROM;Right;10 reps;Seated Elbow Extension: AROM;Right;10 reps;Seated Wrist Flexion: AROM;Right;10 reps;Seated Wrist Extension: AROM;Right;10 reps;Seated Digit Composite Flexion: AROM;Right;10  reps;Seated Composite Extension: AROM;Right;10 reps;Seated Neck Flexion: AROM;Seated Neck Extension: AROM;Seated Neck Lateral Flexion - Right: AROM;Seated Neck Lateral Flexion - Left: AROM;Seated Hand Exercises Forearm Supination: AROM;Right;10 reps;Seated Forearm Pronation: AROM;Right;10 reps;Seated   Shoulder Instructions Shoulder Instructions Donning/doffing shirt without moving shoulder: Moderate assistance Method for sponge bathing under operated UE: Minimal assistance Donning/doffing sling/immobilizer: Maximal assistance Correct positioning of sling/immobilizer: Moderate assistance Pendulum exercises (written home exercise program): Minimal assistance ROM for elbow, wrist and digits of operated UE: Min-guard Sling wearing schedule (on at all times/off for ADL's): Min-guard Proper positioning of operated UE when showering: Min-guard Positioning of UE while sleeping: Minimal assistance    Home Living Family/patient expects to be discharged to:: Private residence Living Arrangements: Children Available Help at Discharge: Family;Available 24 hours/day(per previous charting son is not a reliable caregiver) Type of Home: House Home Access: Level entry     Home Layout: One level     Bathroom Shower/Tub: Teacher, early years/pre: Standard     Home Equipment: None          Prior Functioning/Environment Level of Independence: Independent                 OT Problem List: Decreased strength;Decreased range of motion;Decreased activity tolerance;Decreased cognition;Decreased safety awareness;Decreased knowledge of use of DME or AE;Decreased knowledge of precautions;Pain;Impaired UE functional use      OT Treatment/Interventions: Self-care/ADL training;Therapeutic exercise;Energy conservation;DME and/or AE instruction;Therapeutic activities;Cognitive remediation/compensation;Patient/family education;Balance training    OT Goals(Current goals can be found in  the care plan section) Acute Rehab OT Goals Patient Stated Goal: go home OT Goal Formulation: With patient Time For Goal Achievement: 09/24/19 Potential to Achieve Goals: Good  OT Frequency: Min 3X/week   Barriers to D/C:            Co-evaluation  AM-PAC OT "6 Clicks" Daily Activity     Outcome Measure Help from another person eating meals?: A Little Help from another person taking care of personal grooming?: A Little Help from another person toileting, which includes using toliet, bedpan, or urinal?: A Lot Help from another person bathing (including washing, rinsing, drying)?: A Lot Help from another person to put on and taking off regular upper body clothing?: A Lot Help from another person to put on and taking off regular lower body clothing?: A Lot 6 Click Score: 14   End of Session Equipment Utilized During Treatment: Other (comment)(sling) Nurse Communication: Mobility status(needs second session)  Activity Tolerance: Patient tolerated treatment well Patient left: in chair;with call bell/phone within reach  OT Visit Diagnosis: Muscle weakness (generalized) (M62.81);Pain Pain - Right/Left: Right Pain - part of body: Shoulder                Time: 3007-6226 OT Time Calculation (min): 26 min Charges:  OT General Charges $OT Visit: 1 Visit OT Evaluation $OT Eval Moderate Complexity: 1 Mod OT Treatments $Self Care/Home Management : 8-22 mins  Marcy Siren, OT Supplemental Rehabilitation Services Pager [phone] Office [phone]   Orlando Penner 09/10/2019, 10:00 AM

## 2019-09-10 NOTE — TOC Transition Note (Addendum)
Transition of Care [facility]) - CM/SW Discharge Note   Patient Details  Name: Elizabeth [name] MRN: [id number] Date of Birth: [birth year]  [facility] CM/SW Contact:  Sharin Mons, RN Phone Number: 09/10/2019, 9:59 AM   Clinical Narrative:    Pt will transition to home today with son. Pt changed her mind to [facility] services and is now agreeable. Referral for home health service made with [facility] after choice given and selected by pt. Pt's cell # [phone]. Pt states has transportation to home.  Final next level of care: Home w Home Health Services Barriers to Discharge: No Barriers Identified   Patient Goals and CMS Choice     Choice offered to / list presented to : Patient  Discharge Placement                       Discharge Plan and Services                DME Arranged: N/A DME Agency: NA       HH Arranged: PT, OT [place] Agency: [facility] [employer] Date [place]: 09/10/19 Time [place]: [phone] Representative spoke with at [place]: [place] ([place]) Interventions     Readmission Risk Interventions No flowsheet data found.

## 2019-09-10 NOTE — Progress Notes (Signed)
Occupational Therapy Treatment Patient Details Name: Elizabeth [name] MRN: [id number] DOB: Sep 17, 1941 Today's Date: 09/10/2019    History of present illness 78yo female s/p R reverse total shoulder arthoroplasty done 09/09/19. PMH L UE congenital defect, HTN, IBS, neck surgery, anxiety, mild dementia, dizziness   OT comments  Pt seen for additional session with son present. Further reviewed shoulder precautions with both son/pt as well as compensatory technique for completing ADL tasks including UB dressing and sling management. Son verbalizing understanding. Pt not as receptive to education this session as she is very focused on discharging home, required increased encouragement to participate and be a part of education/session. Continue to recommend [place] services at time of discharge to further maximize her safety and independence with ADL and mobility.    Follow Up Recommendations  Home health OT;Supervision/Assistance - 24 hour(if no 24hr will need SNF)    Equipment Recommendations  None recommended by OT          Precautions / Restrictions Precautions Precautions: Shoulder Type of Shoulder Precautions: NWB R UE, R UE in sling (sling AAT except ADL, exercise), okay for pendulums; congenital deformity L UE Shoulder Interventions: Shoulder sling/immobilizer;At all times;Off for dressing/bathing/exercises Precaution Booklet Issued: Yes (comment) Precaution Comments: issued and thoroughly reviewed Required Braces or Orthoses: Sling Restrictions Weight Bearing Restrictions: Yes RUE Weight Bearing: Non weight bearing       Mobility Bed Mobility               General bed mobility comments: received standing in hallway with son/ RN  Transfers Overall transfer level: Modified independent Equipment used: None             General transfer comment: emphasis on maintaining NWB in RUE    Balance Overall balance assessment: No apparent balance deficits (not formally  assessed)                                         ADL either performed or assessed with clinical judgement   ADL Overall ADL's : Needs assistance/impaired                                     Functional mobility during ADLs: Modified independent General ADL Comments: pt seen for additional session with son present, reviewed shoulder precautions and compensatory techniques for completing ADL including UB dressing/bathing, sling management     Vision       Perception     Praxis      Cognition Arousal/Alertness: Awake/alert Behavior During Therapy: Impulsive;WFL for tasks assessed/performed Overall Cognitive Status: Impaired/Different from baseline                                 General Comments: pt remains impulsive, not very receptive to education this session with son present, in a hurry to get home                          Pertinent Vitals/ Pain       Pain Assessment: Faces Faces Pain Scale: Hurts little more Pain Location: R shoulder Pain Descriptors / Indicators: Aching;Discomfort Pain Intervention(s): Monitored during session;Limited activity within patient's tolerance;Repositioned  Home Living  Prior Functioning/Environment              Frequency  Min 3X/week        Progress Toward Goals  OT Goals(current goals can now be found in the care plan section)  Progress towards OT goals: Progressing toward goals  Acute Rehab OT Goals Patient Stated Goal: go home OT Goal Formulation: With patient Time For Goal Achievement: 09/24/19 Potential to Achieve Goals: Good ADL Goals Pt Will Perform Upper Body Dressing: with min assist;with caregiver independent in assisting;sitting Pt/caregiver will Perform Home Exercise Program: With Supervision;With written HEP provided Additional ADL Goal #1: pt will adhere to shoulder precautions with no more than  min cues during ADL task.  Plan Discharge plan remains appropriate    Co-evaluation                 AM-PAC OT "6 Clicks" Daily Activity     Outcome Measure   Help from another person eating meals?: A Little Help from another person taking care of personal grooming?: A Little Help from another person toileting, which includes using toliet, bedpan, or urinal?: A Lot Help from another person bathing (including washing, rinsing, drying)?: A Lot Help from another person to put on and taking off regular upper body clothing?: A Lot Help from another person to put on and taking off regular lower body clothing?: A Lot 6 Click Score: 14    End of Session Equipment Utilized During Treatment: Other (comment)(sling)  OT Visit Diagnosis: Muscle weakness (generalized) (M62.81);Pain Pain - Right/Left: Right Pain - part of body: Shoulder   Activity Tolerance Patient tolerated treatment well   Patient Left in chair;with call bell/phone within reach   Nurse Communication Mobility status        Time: 3151-7616 OT Time Calculation (min): 9 min  Charges: OT General Charges $OT Visit: 1 Visit OT Treatments $Self Care/Home Management : 8-22 mins  Marcy Siren, OT Supplemental Rehabilitation Services Pager [phone] Office [phone]     Orlando Penner 09/10/2019, 1:10 PM

## 2019-09-10 NOTE — Progress Notes (Signed)
   Ortho Hand Progress Note  Subjective: No acute events last night. Block wore off last night. Pain controlled   Objective: Vital signs in last 24 hours: Temp:  [97.7 F (36.5 C)-98.3 F (36.8 C)] 98.1 F (36.7 C) (09/29 0419) Pulse Rate:  [64-93] 91 (09/29 0419) Resp:  [15-21] 18 (09/29 0419) BP: (77-154)/(40-86) 152/82 (09/29 0419) SpO2:  [94 %-100 %] 99 % (09/29 0419)  Intake/Output from previous day: 09/28 0701 - 09/29 0700 In: 1020 [P.O.:120; I.V.:800] Out: 150 [Blood:150] Intake/Output this shift: No intake/output data recorded.  Recent Labs    09/10/19 0549  HGB 11.9*   Recent Labs    09/10/19 0549  WBC 11.5*  RBC 4.24  HCT 37.1  PLT 253   Recent Labs    09/10/19 0549  NA 137  K 3.8  CL 102  CO2 26  BUN 17  CREATININE 0.84  GLUCOSE 133*  CALCIUM 9.1   No results for input(s): LABPT, INR in the last 72 hours.  Aaox3 nad Resp nonlabored RRR RUE: dressings in place on shoulder, c/d/i. Minimal swelling. Arm in shoulder immob. SILT m/u/r/a. Motor intact m/u/r. Fingers wwp with bcr.   Assessment/Plan: 79 yo F s/p R RSA, POD1  - PO plus IV pain meds - Regular diet - post op abx - PT/OT for mobilization. NWB RUE. Sling at all times except for self care and hygiene. Pendulums - Continue current dressing   Plan for d/c home today. Follow up in 10-14 days.   Elizabeth [name] 09/10/2019, 7:13 AM  [phone]) [phone]

## 2019-09-10 NOTE — Discharge Summary (Signed)
Patient ID: Elizabeth [name] MRN: [id number] DOB/AGE: [birth year] 78 y.o.  Admit date: 09/09/2019 Discharge date:   Admission Diagnoses: right shoulder osteoarthritis Past Medical History:  Diagnosis Date  . Allergic rhinitis   . Anxiety   . Arthritis    right shoulder  . Congenital defect    left arm  . Depression   . GERD (gastroesophageal reflux disease)   . Headache   . Hyperlipemia   . Hypertension   . IBS (irritable bowel syndrome)   . Insomnia   . Memory loss   . Vitamin D deficiency   . Wears glasses     Discharge Diagnoses:  Active Problems:   S/p reverse total shoulder arthroplasty   Surgeries: Procedure(s): REVERSE SHOULDER ARTHROPLASTY on 09/09/2019    Consultants:   Discharged Condition: Improved  Hospital Course: Elizabeth [name] is an 78 y.o. female who was admitted 09/09/2019 with a chief complaint of right shoulder pain, and found to have a diagnosis of right shoulder osteoarthritis.  They were brought to the operating room on 09/09/2019 and underwent Procedure(s): REVERSE SHOULDER ARTHROPLASTY.    They were given perioperative antibiotics:  Anti-infectives (From admission, onward)   Start     Dose/Rate Route Frequency Ordered Stop   09/09/19 1600  ceFAZolin (ANCEF) IVPB 1 g/50 mL premix     1 g 100 mL/hr over 30 Minutes Intravenous Every 8 hours 09/09/19 1147 09/10/19 0439   09/09/19 0809  vancomycin (VANCOCIN) powder  Status:  Discontinued       As needed 09/09/19 0809 09/09/19 0933   09/09/19 0615  ceFAZolin (ANCEF) IVPB 2g/100 mL premix     2 g 200 mL/hr over 30 Minutes Intravenous On call to O.R. 09/09/19 3875 09/09/19 0750    .  They were given sequential compression devices, early ambulation for DVT prophylaxis.  Recent vital signs:  Patient Vitals for the past 24 hrs:  BP Temp Temp src Pulse Resp SpO2  09/10/19 0419 (!) 152/82 98.1 F (36.7 C) Oral 91 18 99 %  09/09/19 2307 (!) 139/57 98.1 F (36.7 C) Oral 92 18 97 %  09/09/19  1939 123/60 98.3 F (36.8 C) Oral 93 16 94 %  09/09/19 1624 136/63 98.3 F (36.8 C) Oral 83 16 96 %  09/09/19 1138 123/74 97.7 F (36.5 C) Oral 77 18 96 %  09/09/19 1123 135/64 - - 72 17 94 %  09/09/19 1109 127/61 - - 72 18 94 %  09/09/19 1100 - 97.8 F (36.6 C) - 71 20 98 %  09/09/19 1053 (!) 141/62 - - 70 18 99 %  09/09/19 1045 - - - 70 18 100 %  09/09/19 1038 136/77 - - 71 17 100 %  09/09/19 1030 - - - 71 16 100 %  09/09/19 1023 119/84 - - 71 18 100 %  09/09/19 1015 - - - 73 19 100 %  09/09/19 1012 108/81 - - 74 20 99 %  09/09/19 1000 - - - 74 (!) 21 100 %  09/09/19 0953 (!) 143/86 - - 74 19 98 %  09/09/19 0945 - - - 74 17 100 %  09/09/19 0940 - - - 68 18 100 %  09/09/19 0938 (!) 140/40 97.7 F (36.5 C) - 68 19 100 %  09/09/19 0723 - - - 66 - 100 %  09/09/19 0722 (!) 154/78 - - 65 17 100 %  .  Recent laboratory studies: Dg Shoulder Right Port  Result Date: 09/09/2019 CLINICAL DATA:  Right shoulder arthroplasty. EXAM: PORTABLE RIGHT SHOULDER COMPARISON:  02/08/2019. FINDINGS: Total right shoulder replacement. Hardware intact. Anatomic alignment. Acromioclavicular degenerative change. No acute bony abnormality. IMPRESSION: Total right shoulder replacement with anatomic alignment. Electronically Signed   By: Marcello Moores  Register   On: 09/09/2019 09:51    Discharge Medications:   Allergies as of 09/10/2019      Reactions   Norvasc [amlodipine] Swelling   Pt not aware of    Benicar [olmesartan] Other (See Comments)   Nervousness Pt not aware of    Lisinopril Cough   Pt not aware of       Medication List    STOP taking these medications   polyethylene glycol 17 g packet Commonly known as: MIRALAX / GLYCOLAX     TAKE these medications   cyclobenzaprine 5 MG tablet Commonly known as: FLEXERIL Take 1 tablet (5 mg total) by mouth 2 (two) times daily as needed for muscle spasms. What changed: when to take this   diphenhydramine-acetaminophen 25-500 MG Tabs tablet Commonly  known as: TYLENOL PM Take 3 tablets by mouth at bedtime.   DULoxetine 60 MG capsule Commonly known as: CYMBALTA Take 1 capsule (60 mg total) by mouth daily.   HYDROcodone-acetaminophen 5-325 MG tablet Commonly known as: NORCO/VICODIN Take 1-2 tablets by mouth every 6 (six) hours as needed for moderate pain (pain score 4-6).   metoprolol succinate 50 MG 24 hr tablet Commonly known as: TOPROL-XL Take 75 mg by mouth 2 (two) times daily. Take with or immediately following a meal.       Diagnostic Studies: Dg Shoulder Right Port  Result Date: 09/09/2019 CLINICAL DATA:  Right shoulder arthroplasty. EXAM: PORTABLE RIGHT SHOULDER COMPARISON:  02/08/2019. FINDINGS: Total right shoulder replacement. Hardware intact. Anatomic alignment. Acromioclavicular degenerative change. No acute bony abnormality. IMPRESSION: Total right shoulder replacement with anatomic alignment. Electronically Signed   By: Marcello Moores  Register   On: 09/09/2019 09:51    They benefited maximally from their hospital stay and there were no complications.     Disposition: Discharge disposition: 01-Home or Self Care      Discharge Instructions    Call MD / Call 911   Complete by: As directed    If you experience chest pain or shortness of breath, CALL 911 and be transported to the hospital emergency room.  If you develope a fever above 101 F, pus (white drainage) or increased drainage or redness at the wound, or calf pain, call your surgeon's office.   Constipation Prevention   Complete by: As directed    Drink plenty of fluids.  Prune juice may be helpful.  You may use a stool softener, such as Colace (over the counter) 100 mg twice a day.  Use MiraLax (over the counter) for constipation as needed.   Diet - low sodium heart healthy   Complete by: As directed    Increase activity slowly as tolerated   Complete by: As directed      Follow-up Information    Avanell Shackleton III, MD Follow up in 2 week(s).   Contact  information: [address] [place] [address] [phone]            Signed: Verner Mould, MD  09/10/2019, 7:21 AM

## 2019-09-10 NOTE — Plan of Care (Signed)
Patient alert and oriented, mae's well, voiding adequate amount of urine, swallowing without difficulty, no c/o pain at time of discharge. Patient discharged home with family. Script and discharged instructions given to patient. Patient and family stated understanding of instructions given. Patient has an appointment with Dr. Creighton 

## 2019-09-10 NOTE — Progress Notes (Signed)
PT Cancellation Note  Patient Details Name: Elizabeth [name] MRN: [id number] DOB: 09/27/1941   Cancelled Treatment:    Reason Eval/Treat Not Completed: Discussed pt case with OT who reports that pt does not require any further PT services. PT observed pt mobilizing independently in the hallway without an AD and does not have any stairs to negotiate at home. Will sign off at this time. If needs change, please reconsult.    Thelma Comp 09/10/2019, 9:40 AM   Rolinda Roan, PT, DPT Acute Rehabilitation Services Pager: [phone] Office: [phone]

## 2019-09-12 DIAGNOSIS — E559 Vitamin D deficiency, unspecified: Secondary | ICD-10-CM | POA: Diagnosis not present

## 2019-09-12 DIAGNOSIS — K219 Gastro-esophageal reflux disease without esophagitis: Secondary | ICD-10-CM | POA: Diagnosis not present

## 2019-09-12 DIAGNOSIS — M19011 Primary osteoarthritis, right shoulder: Secondary | ICD-10-CM | POA: Diagnosis not present

## 2019-09-12 DIAGNOSIS — Z96611 Presence of right artificial shoulder joint: Secondary | ICD-10-CM | POA: Diagnosis not present

## 2019-09-12 DIAGNOSIS — I10 Essential (primary) hypertension: Secondary | ICD-10-CM | POA: Diagnosis not present

## 2019-09-12 DIAGNOSIS — F329 Major depressive disorder, single episode, unspecified: Secondary | ICD-10-CM | POA: Diagnosis not present

## 2019-09-12 DIAGNOSIS — E785 Hyperlipidemia, unspecified: Secondary | ICD-10-CM | POA: Diagnosis not present

## 2019-09-12 DIAGNOSIS — G4700 Insomnia, unspecified: Secondary | ICD-10-CM | POA: Diagnosis not present

## 2019-09-12 DIAGNOSIS — F419 Anxiety disorder, unspecified: Secondary | ICD-10-CM | POA: Diagnosis not present

## 2019-09-12 DIAGNOSIS — Z471 Aftercare following joint replacement surgery: Secondary | ICD-10-CM | POA: Diagnosis not present

## 2019-09-25 DIAGNOSIS — Z4789 Encounter for other orthopedic aftercare: Secondary | ICD-10-CM | POA: Diagnosis not present

## 2019-09-25 DIAGNOSIS — M19011 Primary osteoarthritis, right shoulder: Secondary | ICD-10-CM | POA: Diagnosis not present

## 2019-10-12 DIAGNOSIS — K219 Gastro-esophageal reflux disease without esophagitis: Secondary | ICD-10-CM | POA: Diagnosis not present

## 2019-10-12 DIAGNOSIS — E785 Hyperlipidemia, unspecified: Secondary | ICD-10-CM | POA: Diagnosis not present

## 2019-10-12 DIAGNOSIS — F329 Major depressive disorder, single episode, unspecified: Secondary | ICD-10-CM | POA: Diagnosis not present

## 2019-10-12 DIAGNOSIS — E559 Vitamin D deficiency, unspecified: Secondary | ICD-10-CM | POA: Diagnosis not present

## 2019-10-12 DIAGNOSIS — Z96611 Presence of right artificial shoulder joint: Secondary | ICD-10-CM | POA: Diagnosis not present

## 2019-10-12 DIAGNOSIS — M19011 Primary osteoarthritis, right shoulder: Secondary | ICD-10-CM | POA: Diagnosis not present

## 2019-10-12 DIAGNOSIS — G4700 Insomnia, unspecified: Secondary | ICD-10-CM | POA: Diagnosis not present

## 2019-10-12 DIAGNOSIS — F419 Anxiety disorder, unspecified: Secondary | ICD-10-CM | POA: Diagnosis not present

## 2019-10-12 DIAGNOSIS — I10 Essential (primary) hypertension: Secondary | ICD-10-CM | POA: Diagnosis not present

## 2019-10-12 DIAGNOSIS — Z471 Aftercare following joint replacement surgery: Secondary | ICD-10-CM | POA: Diagnosis not present

## 2019-10-18 DIAGNOSIS — G4700 Insomnia, unspecified: Secondary | ICD-10-CM | POA: Diagnosis not present

## 2019-10-18 DIAGNOSIS — Z471 Aftercare following joint replacement surgery: Secondary | ICD-10-CM | POA: Diagnosis not present

## 2019-10-18 DIAGNOSIS — F419 Anxiety disorder, unspecified: Secondary | ICD-10-CM | POA: Diagnosis not present

## 2019-10-18 DIAGNOSIS — I10 Essential (primary) hypertension: Secondary | ICD-10-CM | POA: Diagnosis not present

## 2019-10-18 DIAGNOSIS — E559 Vitamin D deficiency, unspecified: Secondary | ICD-10-CM | POA: Diagnosis not present

## 2019-10-18 DIAGNOSIS — F329 Major depressive disorder, single episode, unspecified: Secondary | ICD-10-CM | POA: Diagnosis not present

## 2019-10-19 DIAGNOSIS — I10 Essential (primary) hypertension: Secondary | ICD-10-CM | POA: Diagnosis not present

## 2019-10-19 DIAGNOSIS — E559 Vitamin D deficiency, unspecified: Secondary | ICD-10-CM | POA: Diagnosis not present

## 2019-10-19 DIAGNOSIS — F329 Major depressive disorder, single episode, unspecified: Secondary | ICD-10-CM | POA: Diagnosis not present

## 2019-10-19 DIAGNOSIS — Z471 Aftercare following joint replacement surgery: Secondary | ICD-10-CM | POA: Diagnosis not present

## 2019-10-19 DIAGNOSIS — F419 Anxiety disorder, unspecified: Secondary | ICD-10-CM | POA: Diagnosis not present

## 2019-10-19 DIAGNOSIS — G4700 Insomnia, unspecified: Secondary | ICD-10-CM | POA: Diagnosis not present

## 2019-10-23 ENCOUNTER — Ambulatory Visit: Payer: Medicare Other | Admitting: Psychology

## 2019-10-23 DIAGNOSIS — Z4789 Encounter for other orthopedic aftercare: Secondary | ICD-10-CM | POA: Diagnosis not present

## 2019-10-23 DIAGNOSIS — M19011 Primary osteoarthritis, right shoulder: Secondary | ICD-10-CM | POA: Diagnosis not present

## 2019-10-24 DIAGNOSIS — Z471 Aftercare following joint replacement surgery: Secondary | ICD-10-CM | POA: Diagnosis not present

## 2019-10-24 DIAGNOSIS — F419 Anxiety disorder, unspecified: Secondary | ICD-10-CM | POA: Diagnosis not present

## 2019-10-24 DIAGNOSIS — G4700 Insomnia, unspecified: Secondary | ICD-10-CM | POA: Diagnosis not present

## 2019-10-24 DIAGNOSIS — F329 Major depressive disorder, single episode, unspecified: Secondary | ICD-10-CM | POA: Diagnosis not present

## 2019-10-24 DIAGNOSIS — I10 Essential (primary) hypertension: Secondary | ICD-10-CM | POA: Diagnosis not present

## 2019-10-24 DIAGNOSIS — E559 Vitamin D deficiency, unspecified: Secondary | ICD-10-CM | POA: Diagnosis not present

## 2019-10-25 DIAGNOSIS — E559 Vitamin D deficiency, unspecified: Secondary | ICD-10-CM | POA: Diagnosis not present

## 2019-10-25 DIAGNOSIS — Z471 Aftercare following joint replacement surgery: Secondary | ICD-10-CM | POA: Diagnosis not present

## 2019-10-25 DIAGNOSIS — I10 Essential (primary) hypertension: Secondary | ICD-10-CM | POA: Diagnosis not present

## 2019-10-25 DIAGNOSIS — F329 Major depressive disorder, single episode, unspecified: Secondary | ICD-10-CM | POA: Diagnosis not present

## 2019-10-25 DIAGNOSIS — F419 Anxiety disorder, unspecified: Secondary | ICD-10-CM | POA: Diagnosis not present

## 2019-10-25 DIAGNOSIS — G4700 Insomnia, unspecified: Secondary | ICD-10-CM | POA: Diagnosis not present

## 2019-10-29 DIAGNOSIS — I10 Essential (primary) hypertension: Secondary | ICD-10-CM | POA: Diagnosis not present

## 2019-10-29 DIAGNOSIS — E559 Vitamin D deficiency, unspecified: Secondary | ICD-10-CM | POA: Diagnosis not present

## 2019-10-29 DIAGNOSIS — F329 Major depressive disorder, single episode, unspecified: Secondary | ICD-10-CM | POA: Diagnosis not present

## 2019-10-29 DIAGNOSIS — G4700 Insomnia, unspecified: Secondary | ICD-10-CM | POA: Diagnosis not present

## 2019-10-29 DIAGNOSIS — F419 Anxiety disorder, unspecified: Secondary | ICD-10-CM | POA: Diagnosis not present

## 2019-10-29 DIAGNOSIS — Z471 Aftercare following joint replacement surgery: Secondary | ICD-10-CM | POA: Diagnosis not present

## 2019-10-30 DIAGNOSIS — F329 Major depressive disorder, single episode, unspecified: Secondary | ICD-10-CM | POA: Diagnosis not present

## 2019-10-30 DIAGNOSIS — E559 Vitamin D deficiency, unspecified: Secondary | ICD-10-CM | POA: Diagnosis not present

## 2019-10-30 DIAGNOSIS — I10 Essential (primary) hypertension: Secondary | ICD-10-CM | POA: Diagnosis not present

## 2019-10-30 DIAGNOSIS — F419 Anxiety disorder, unspecified: Secondary | ICD-10-CM | POA: Diagnosis not present

## 2019-10-30 DIAGNOSIS — G4700 Insomnia, unspecified: Secondary | ICD-10-CM | POA: Diagnosis not present

## 2019-10-30 DIAGNOSIS — Z471 Aftercare following joint replacement surgery: Secondary | ICD-10-CM | POA: Diagnosis not present

## 2019-10-31 DIAGNOSIS — F329 Major depressive disorder, single episode, unspecified: Secondary | ICD-10-CM | POA: Diagnosis not present

## 2019-10-31 DIAGNOSIS — G4700 Insomnia, unspecified: Secondary | ICD-10-CM | POA: Diagnosis not present

## 2019-10-31 DIAGNOSIS — I10 Essential (primary) hypertension: Secondary | ICD-10-CM | POA: Diagnosis not present

## 2019-10-31 DIAGNOSIS — E559 Vitamin D deficiency, unspecified: Secondary | ICD-10-CM | POA: Diagnosis not present

## 2019-10-31 DIAGNOSIS — Z471 Aftercare following joint replacement surgery: Secondary | ICD-10-CM | POA: Diagnosis not present

## 2019-10-31 DIAGNOSIS — F419 Anxiety disorder, unspecified: Secondary | ICD-10-CM | POA: Diagnosis not present

## 2020-01-07 ENCOUNTER — Ambulatory Visit (INDEPENDENT_AMBULATORY_CARE_PROVIDER_SITE_OTHER): Payer: Medicare Other | Admitting: Adult Health

## 2020-01-07 ENCOUNTER — Other Ambulatory Visit: Payer: Self-pay

## 2020-01-07 ENCOUNTER — Encounter: Payer: Self-pay | Admitting: Adult Health

## 2020-01-07 VITALS — BP 137/74 | HR 82 | Temp 97.3°F | Ht 63.0 in | Wt 129.2 lb

## 2020-01-07 DIAGNOSIS — R4189 Other symptoms and signs involving cognitive functions and awareness: Secondary | ICD-10-CM | POA: Diagnosis not present

## 2020-01-07 DIAGNOSIS — R2681 Unsteadiness on feet: Secondary | ICD-10-CM

## 2020-01-07 DIAGNOSIS — M542 Cervicalgia: Secondary | ICD-10-CM

## 2020-01-07 DIAGNOSIS — F419 Anxiety disorder, unspecified: Secondary | ICD-10-CM

## 2020-01-07 NOTE — Progress Notes (Signed)
PATIENT: Elizabeth [name] DOB: 05/06/1941    [facility] provider: Dr. Terrace Arabia REASON FOR VISIT: follow up neck pain HISTORY FROM: patient  Chief Complaint  Patient presents with  . Follow-up    TxRM. alone. states she is still has bad depression. Still having neck pain occasionally. states that she has fallen approx 3x since the Holiday.      HPI: Elizabeth [name] is a 79 year old female with PMH  hypertension, orthostatic hypotension, depression anxiety, chronic insomnia, hyperlipidemia, congenital left arm defect, vitamin D deficiency, cervical decompression more than 30 years ago with benefit.  Initially evaluated by Dr. Terrace Arabia on 02/06/2017 after PCP referred to our office for evaluation of memory loss and head injury with ongoing complaints of memory disturbance, altered mental status, generalized weakness and speech difficulty.  In 11/2016, had a fall resulting in abrasion and transient loss of consciousness but no further injuries. She reported gradual onset gait abnormality since 2013, fell often, legs give out underneath her often, right since to be more frequent, she denies bilateral feet paresthesia, denies bowel and bladder incontinence.  MRI brain 02/2017-generalized atrophy supratentorium small vessel disease EEG 05/2017 mild generalized slowing during admission acute encephalopathy EMG/NCV 07/2017 moderate right carpal tunnel syndrome MRI cervical spine 11/2016 no evidence of nerve root or spinal cord compression with only mild to moderate DJD CT head 01/2019 traumatic right frontal lobe parenchymal hemorrhage post fall   Elizabeth [name] is a 79 year old female who is being seen today for 7-month follow-up with prior visit 06/2019.  Ongoing use of Cymbalta and and flexeril with ongoing benefit regarding neck pain and anxiety.  She did undergo right reverse total shoulder arthroplasty on 09/09/2019 due to osteoarthritis tolerated well without complication.  She does endorse increased falls since  the holidays and is unsure if this is due to tripping or loss of balance.  Falls typically occur when ambulating on uneven surfaces or walking her dog.  She does have left orbital ecchymosis from prior fall.  Denies loss of consciousness or any other injuries.  She is interested in participating in outpatient PT for possible deconditioning.  At prior visit, she was referred to behavioral health but she did not make initial evaluation as she was not interested in virtual visits.  She does states she will participate when she is able to be seen in the office.  Memory has been stable.  No further concerns at this time.    REVIEW OF SYSTEMS: Out of a complete 14 system review of symptoms, the patient complains only of the following symptoms, and all other reviewed systems are negative. Depression, anxiety increased falls and neck pain   ALLERGIES: Allergies  Allergen Reactions  . Norvasc [Amlodipine] Swelling    Pt not aware of   . Benicar [Olmesartan] Other (See Comments)    Nervousness Pt not aware of   . Lisinopril Cough    Pt not aware of     HOME MEDICATIONS: Outpatient Medications Prior to Visit  Medication Sig Dispense Refill  . cyclobenzaprine (FLEXERIL) 5 MG tablet Take 1 tablet (5 mg total) by mouth 2 (two) times daily as needed for muscle spasms. (Patient taking differently: Take 5 mg by mouth 2 (two) times daily. ) 180 tablet 3  . diphenhydramine-acetaminophen (TYLENOL PM) 25-500 MG TABS tablet Take 3 tablets by mouth at bedtime.    . DULoxetine (CYMBALTA) 60 MG capsule Take 1 capsule (60 mg total) by mouth daily. 90 capsule 3  . HYDROcodone-acetaminophen (NORCO/VICODIN)  5-325 MG tablet Take 1-2 tablets by mouth every 6 (six) hours as needed for moderate pain (pain score 4-6). 35 tablet 0  . metoprolol succinate (TOPROL-XL) 50 MG 24 hr tablet Take 75 mg by mouth 2 (two) times daily. Take with or immediately following a meal.  "takes 1.5 of the pills."     No  facility-administered medications prior to visit.    PAST MEDICAL HISTORY: Past Medical History:  Diagnosis Date  . Allergic rhinitis   . Anxiety   . Arthritis    right shoulder  . Congenital defect    left arm  . Depression   . GERD (gastroesophageal reflux disease)   . Headache   . Hyperlipemia   . Hypertension   . IBS (irritable bowel syndrome)   . Insomnia   . Memory loss   . Vitamin D deficiency   . Wears glasses     PAST SURGICAL HISTORY: Past Surgical History:  Procedure Laterality Date  . ABDOMINAL HYSTERECTOMY    . CATARACT EXTRACTION Bilateral   . NECK SURGERY    . REVERSE SHOULDER ARTHROPLASTY Right 09/09/2019   Procedure: REVERSE SHOULDER ARTHROPLASTY;  Surgeon: Ernest Mallick, MD;  [facility];  Service: Orthopedics;  Laterality: Right;     FAMILY HISTORY: Family History  Adopted: Yes  Problem Relation Age of Onset  . Breast cancer Mother   . Other Father        unsure of history    SOCIAL HISTORY: Social History   Socioeconomic History  . Marital status: Widowed    Spouse name: Not on file  . Number of children: 1  . Years of education: HS  . Highest education level: Not on file  Occupational History  . Occupation: Retired  Tobacco Use  . Smoking status: Never Smoker  . Smokeless tobacco: Never Used  Substance and Sexual Activity  . Alcohol use: No  . Drug use: No  . Sexual activity: Not on file  Other Topics Concern  . Not on file  Social History Narrative   Lives at [facility] at this time.  Daughter Elizabeth [name] and Elizabeth [name].    Right-handed.   No caffeine use.   Social Determinants of Health   Financial Resource Strain:   . Difficulty of Paying Living Expenses: Not on file  Food Insecurity:   . Worried About Programme researcher, broadcasting/film/video in the Last Year: Not on file  . Ran Out of Food in the Last Year: Not on file  Transportation Needs:   . Lack of Transportation (Medical): Not on file  . Lack of  Transportation (Non-Medical): Not on file  Physical Activity:   . Days of Exercise per Week: Not on file  . Minutes of Exercise per Session: Not on file  Stress:   . Feeling of Stress : Not on file  Social Connections:   . Frequency of Communication with Friends and Family: Not on file  . Frequency of Social Gatherings with Friends and Family: Not on file  . Attends Religious Services: Not on file  . Active Member of Clubs or Organizations: Not on file  . Attends Banker Meetings: Not on file  . Marital Status: Not on file  Intimate Partner Violence:   . Fear of Current or Ex-Partner: Not on file  . Emotionally Abused: Not on file  . Physically Abused: Not on file  . Sexually Abused: Not on file      PHYSICAL EXAM  Vitals:   01/07/20 1439  BP: 137/74  Pulse: 82  Temp: (!) 97.3 F (36.3 C)  Weight: 129 lb 3.2 oz (58.6 kg)  Height: 5\' 3"  (1.6 m)   Body mass index is 22.89 kg/m.  General: well developed, well nourished,  pleasant elderly Caucasian female, seated, in no evident distress Head: head normocephalic and atraumatic.   Neck: supple with no carotid or supraclavicular bruits Cardiovascular: regular rate and rhythm, no murmurs Musculoskeletal: no deformity Skin:  no rash/petichiae Vascular:  Normal pulses all extremities   Neurologic Exam Mental Status: Awake and fully alert. Oriented to place and time. Recent and remote memory intact. Attention span, concentration and fund of knowledge appropriate. Mood and affect appropriate.  Cranial Nerves: Pupils equal, briskly reactive to light. Extraocular movements full without nystagmus. Visual fields full to confrontation. Hearing intact. Facial sensation intact. Face, tongue, palate moves normally and symmetrically.  Motor: Normal bulk and tone. Normal strength in all tested extremity muscles. Sensory.: intact to touch , pinprick , position and vibratory sensation.  Coordination: Rapid alternating movements  normal in all extremities. Finger-to-nose and heel-to-shin performed accurately bilaterally. Gait and Station: Arises from chair without difficulty. Stance is normal. Gait demonstrates normal stride length and mild imbalance with difficulty performing tandem walk and walking on heels but is able to ambulate on toes without great difficulty Reflexes: 1+ and symmetric. Toes downgoing.      DIAGNOSTIC DATA (LABS, IMAGING, TESTING) - I reviewed patient records, labs, notes, testing and imaging myself where available.  Lab Results  Component Value Date   WBC 11.5 (H) 09/10/2019   HGB 11.9 (L) 09/10/2019   HCT 37.1 09/10/2019   MCV 87.5 09/10/2019   PLT 253 09/10/2019      Component Value Date/Time   NA 137 09/10/2019 0549   NA 141 02/06/2017 1616   K 3.8 09/10/2019 0549   CL 102 09/10/2019 0549   CO2 26 09/10/2019 0549   GLUCOSE 133 (H) 09/10/2019 0549   BUN 17 09/10/2019 0549   BUN 13 02/06/2017 1616   CREATININE 0.84 09/10/2019 0549   CALCIUM 9.1 09/10/2019 0549   PROT 5.9 (L) 05/01/2017 0715   PROT 6.7 02/06/2017 1616   ALBUMIN 4.0 05/12/2017 1053   AST 23 05/01/2017 0715   ALT 19 05/01/2017 0715   ALKPHOS 63 05/01/2017 0715   BILITOT 0.5 05/01/2017 0715   BILITOT <0.2 02/06/2017 1616   GFRNONAA >60 09/10/2019 0549   GFRAA >60 09/10/2019 0549   Lab Results  Component Value Date   VITAMINB12 504 08/31/2017   Lab Results  Component Value Date   TSH 2.640 08/31/2017      ASSESSMENT AND PLAN 79 y.o. year old female  has a past medical history of Allergic rhinitis, Anxiety, Arthritis, Congenital defect, Depression, GERD (gastroesophageal reflux disease), Headache, Hyperlipemia, Hypertension, IBS (irritable bowel syndrome), Insomnia, Memory loss, Vitamin D deficiency, and Wears glasses. here with:  1.  Neck pain 2.  Recent falls 3.  Memory loss 3.  Anxiety/depression  Continuation of Flexeril 5 mg twice daily and Cymbalta 60 mg daily for ongoing benefit of neck  pain.  Referral placed to outpatient PT for increased falls likely due to deconditioning and imbalance.  Referral will be placed for psychiatry once COVID-19 restrictions are lifted as patient declines interest in virtual visit.  Depression/anxiety has been overall stable.  Memory loss has been stable.    She will follow-up in 6 months or call earlier if needed  Greater than 50%  of time during this 20 minute visit was spent on counseling, review neck pain and the continued use of Cymbalta and Flexeril, planning of further management, discussion regarding recent falls and benefit of PT and answered all questions to patient satisfaction   Ihor Austin, [facility]  [facility] [address] [place], [place] [address]  Phone [phone] Fax [phone] Note: This document was prepared with digital dictation and possible smart phrase technology. Any transcriptional errors that result from this process are unintentional.

## 2020-01-07 NOTE — Patient Instructions (Signed)
Your Plan:  Continue Flexeril 5 mg twice daily as needed for muscle spasms and neck pain  Continue Cymbalta 60 mg daily for depression and pain  Referral will be placed to physical therapy due to recent increased falls   Follow-up in 6 months or call earlier if needed     Thank you for coming to see [place] at [facility]. I hope we have been able to provide you high quality care today.  You may receive a patient satisfaction survey over the next few weeks. We would appreciate your feedback and comments so that we may continue to improve ourselves and the health of our patients.

## 2020-01-21 NOTE — Progress Notes (Signed)
I have reviewed and agreed above plan. 

## 2020-01-27 ENCOUNTER — Other Ambulatory Visit: Payer: Self-pay

## 2020-01-27 ENCOUNTER — Ambulatory Visit: Payer: Medicare Other | Admitting: Physical Therapy

## 2020-01-27 DIAGNOSIS — R2689 Other abnormalities of gait and mobility: Secondary | ICD-10-CM

## 2020-01-28 ENCOUNTER — Encounter: Payer: Self-pay | Admitting: Physical Therapy

## 2020-01-28 NOTE — Therapy (Signed)
[facility] [address] [place], [place], [address] Phone: [phone]   Fax:  [phone]  Physical Therapy Evaluation  Patient Details  Name: Elizabeth [name] MRN: [id number] Date of Birth: 1941/08/08 Referring Provider (PT): Ihor Austin   Encounter Date: 01/27/2020  PT End of Session - 01/28/20 1014    Visit Number  1    Number of Visits  12    Date for PT Re-Evaluation  03/09/20    Authorization Type  Medicare    Authorization - Visit Number  1    PT Start Time  1432    PT Stop Time  1514    PT Time Calculation (min)  42 min    Equipment Utilized During Treatment  Gait belt    Activity Tolerance  Patient tolerated treatment well    Behavior During Therapy  Impulsive       Past Medical History:  Diagnosis Date  . Allergic rhinitis   . Anxiety   . Arthritis    right shoulder  . Congenital defect    left arm  . Depression   . GERD (gastroesophageal reflux disease)   . Headache   . Hyperlipemia   . Hypertension   . IBS (irritable bowel syndrome)   . Insomnia   . Memory loss   . Vitamin D deficiency   . Wears glasses     Past Surgical History:  Procedure Laterality Date  . ABDOMINAL HYSTERECTOMY    . CATARACT EXTRACTION Bilateral   . NECK SURGERY    . REVERSE SHOULDER ARTHROPLASTY Right 09/09/2019   Procedure: REVERSE SHOULDER ARTHROPLASTY;  Surgeon: Ernest Mallick, MD;  Location: [facility] OR;  Service: Orthopedics;  Laterality: Right;     There were no vitals filed for this visit.   Subjective Assessment - 01/28/20 1008    Subjective  Pt accompanied  by friend or husband today. She states she has had increased falls since December. States she just gets feet tangled up and falls. Has had recent R total shoulder. Does live with someone that is able to assist as needed, pt does drive short distances. Has been able to continue to walk dog at night, but feels that she is less safe due to falls. No  stairs at home. Pt denies any dizziness at time of falls. She does state what sounds like orthostatic hypotention with transfers. Wearing brooks sneakers today, with wide tread on bottom.    Limitations  Standing;Walking;House hold activities    Patient Stated Goals  decreased falls, improved safety    Currently in Pain?  No/denies         [facility] PT Assessment - 01/28/20 0001      Assessment   Medical Diagnosis  Balance, falls    Referring Provider (PT)  Shanda Bumps McCue    Hand Dominance  Right    Prior Therapy  no      Precautions   Precautions  Fall      Balance Screen   Has the patient fallen in the past 6 months  Yes    How many times?  4    Has the patient had a decrease in activity level because of a fear of falling?   No    Is the patient reluctant to leave their home because of a fear of falling?   No      Prior Function   Level of Independence  Independent      Cognition  Overall Cognitive Status  History of cognitive impairments - at baseline    Behaviors  Impulsive      ROM / Strength   AROM / PROM / Strength  AROM;Strength      AROM   Overall AROM Comments  LE: WFL      Strength   Overall Strength Comments  Hips: 4/5,   Knee: 4+/5,        Standardized Balance Assessment   Standardized Balance Assessment  Berg Balance Test;Dynamic Gait Index      Berg Balance Test   Sit to Stand  Able to stand without using hands and stabilize independently    Standing Unsupported  Able to stand safely 2 minutes    Sitting with Back Unsupported but Feet Supported on Floor or Stool  Able to sit safely and securely 2 minutes    Stand to Sit  Sits safely with minimal use of hands    Transfers  Able to transfer safely, definite need of hands    Standing Unsupported with Eyes Closed  Able to stand 10 seconds safely    Standing Unsupported with Feet Together  Able to place feet together independently and stand 1 minute safely    From Standing, Reach Forward with Outstretched Arm   Can reach forward >12 cm safely (5")    From Standing Position, Pick up Object from Floor  Able to pick up shoe safely and easily    From Standing Position, Turn to Look Behind Over each Shoulder  Looks behind from both sides and weight shifts well    Turn 360 Degrees  Able to turn 360 degrees safely one side only in 4 seconds or less    Standing Unsupported, Alternately Place Feet on Step/Stool  Able to stand independently and complete 8 steps >20 seconds    Standing Unsupported, One Foot in [employer] balance while stepping or standing    Standing on One Leg  Tries to lift leg/unable to hold 3 seconds but remains standing independently    Total Score  45      Dynamic Gait Index   Level Surface  Normal    Change in Gait Speed  Normal    Gait with Horizontal Head Turns  Moderate Impairment    Gait with Vertical Head Turns  Mild Impairment    Gait and Pivot Turn  Mild Impairment    Step Over Obstacle  Moderate Impairment    Step Around Obstacles  Normal    Steps  Mild Impairment    Total Score  17                Objective measurements completed on examination: See above findings.              PT Education - 01/28/20 1013    Education Details  PT POC, HEP, Discussion on slowing down movment, walking, turns, etc, for improved safety    Person(s) Educated  Patient    Methods  Explanation;Demonstration;Verbal cues    Comprehension  Verbalized understanding;Returned demonstration;Need further instruction       PT Short Term Goals - 01/28/20 1015      PT SHORT TERM GOAL #1   Title  Pt to be independent wtih initial HEP    Time  2    Period  Weeks    Status  New    Target Date  02/10/20      PT SHORT TERM GOAL #2   Title  Pt to  demo decreased speed with walking and direction changes in clinic for improved safety    Time  2    Period  Weeks    Status  New    Target Date  02/10/20        PT Long Term Goals - 01/28/20 1016      PT LONG TERM GOAL #1    Title  Pt to be independent with final HEP for LE strength and balance    Time  6    Period  Weeks    Status  New    Target Date  03/09/20      PT LONG TERM GOAL #2   Title  Pt to demo improved score on DGI by at least 2    Time  6    Period  Weeks    Status  New    Target Date  03/09/20      PT LONG TERM GOAL #3   Title  Pt to demo improved score on BERG by at least 3    Time  6    Period  Weeks    Status  New    Target Date  03/09/20      PT LONG TERM GOAL #4   Title  Pt to demo ability for safe ambulation, direction changes, and head turns, at safe/slower pace, to improve safety with home and community navigation, for up to 500 ft.    Time  6    Period  Weeks    Status  New    Target Date  03/09/20             Plan - 01/28/20 1019    Clinical Impression Statement  Pt presents with primary complaint of decreased balance and increased falls. She has is a significant fall risk, with several falls in last few months. Pt has quick speed of walking, transfers, and direction changes, and demonstrates mild/moderate impulsiveness. Discussed safety concerns, and trying to slow speed of movements for improved balance and safety. Pt with decreased scores on DGI andBERG, with decreased balance with dynamic movements. Pt to benefit from skilled PT to improve safety and reduce falls.    Personal Factors and Comorbidities  Comorbidity 1;Comorbidity 2    Comorbidities  Encephalopathy, memory deficits, orthostatic hypotension, impulsive    Examination-Activity Limitations  Bend;Squat;Stairs;Dressing;Lift;Locomotion Level    Examination-Participation Restrictions  Cleaning;Shop;Community Activity;Yard Work;Meal Prep    Stability/Clinical Decision Making  Evolving/Moderate complexity    Clinical Decision Making  Moderate    Rehab Potential  Fair    PT Frequency  2x / week    PT Duration  6 weeks    PT Treatment/Interventions  ADLs/Self Care Home Management;Cryotherapy;Electrical  Stimulation;Iontophoresis 4mg /ml Dexamethasone;Moist Heat;Therapeutic exercise;Therapeutic activities;Functional mobility training;Stair training;Gait training;DME Instruction;Ultrasound;Balance training;Neuromuscular re-education;Cognitive remediation;Patient/family education;Manual techniques;Vestibular;Taping;Splinting;Energy conservation;Dry needling;Passive range of motion;Scar mobilization;Visual/perceptual remediation/compensation;Spinal Manipulations;Joint Manipulations;Canalith Repostioning    Consulted and Agree with Plan of Care  Patient       Patient will benefit from skilled therapeutic intervention in order to improve the following deficits and impairments:  Abnormal gait, Decreased knowledge of precautions, Decreased strength, Decreased activity tolerance, Decreased balance, Decreased mobility, Difficulty walking, Impaired perceived functional ability, Decreased coordination, Decreased safety awareness  Visit Diagnosis: Other abnormalities of gait and mobility     Problem List Patient Active Problem List   Diagnosis Date Noted  . S/p reverse total shoulder arthroplasty 09/09/2019  . Neck pain 08/31/2017  . Encephalopathy 05/11/2017  . Orthostatic hypotension 05/01/2017  .  Generalized weakness 04/30/2017  . Hypertension 04/30/2017  . Depression 04/30/2017  . Right arm pain 04/30/2017  . Anxiety 04/30/2017  . Passed out 02/06/2017  . Memory loss 02/06/2017   Sedalia Muta, PT, DPT 10:32 AM  01/28/20    [facility] [address] [place], [place], [address] Phone: [phone]   Fax:  [phone]  Name: MAGALINE STEINBERG MRN: [id number] Date of Birth: 01/08/1941

## 2020-02-04 ENCOUNTER — Encounter: Payer: Self-pay | Admitting: Physical Therapy

## 2020-02-04 ENCOUNTER — Other Ambulatory Visit: Payer: Self-pay

## 2020-02-04 ENCOUNTER — Ambulatory Visit: Payer: Medicare Other | Admitting: Physical Therapy

## 2020-02-04 DIAGNOSIS — R2689 Other abnormalities of gait and mobility: Secondary | ICD-10-CM

## 2020-02-04 NOTE — Therapy (Signed)
[facility] [address] [place], [place], [address] Phone: [phone]   Fax:  [phone]  Physical Therapy Treatment  Patient Details  Name: Elizabeth [name] MRN: [id number] Date of Birth: Apr 05, 1941 Referring Provider (PT): Frann Rider   Encounter Date: 02/04/2020  PT End of Session - 02/04/20 1427    Visit Number  2    Number of Visits  12    Date for PT Re-Evaluation  03/09/20    Authorization Type  Medicare    Authorization - Visit Number  1    PT Start Time  5638    PT Stop Time  1431    PT Time Calculation (min)  38 min    Equipment Utilized During Treatment  Gait belt    Activity Tolerance  Patient tolerated treatment well    Behavior During Therapy  Impulsive       Past Medical History:  Diagnosis Date  . Allergic rhinitis   . Anxiety   . Arthritis    right shoulder  . Congenital defect    left arm  . Depression   . GERD (gastroesophageal reflux disease)   . Headache   . Hyperlipemia   . Hypertension   . IBS (irritable bowel syndrome)   . Insomnia   . Memory loss   . Vitamin D deficiency   . Wears glasses     Past Surgical History:  Procedure Laterality Date  . ABDOMINAL HYSTERECTOMY    . CATARACT EXTRACTION Bilateral   . NECK SURGERY    . REVERSE SHOULDER ARTHROPLASTY Right 09/09/2019   Procedure: REVERSE SHOULDER ARTHROPLASTY;  Surgeon: Verner Mould, MD;  Location: [facility];  Service: Orthopedics;  Laterality: Right;  135min    There were no vitals filed for this visit.  Subjective Assessment - 02/04/20 1549    Subjective  Pt drove herself to appointment today. States she lives with her son (who brought her last visit). No new complaints.    Currently in Pain?  No/denies                       [facility] Adult PT Treatment/Exercise - 02/04/20 0001      Neuro Re-ed    Neuro Re-ed Details   Side stepping 25 ft x4;  Walk/step over cones 25 ft x6;  Rhomberg stance w head turns  L/R x20;  Tandem stance 30 sec x3 bil;        Exercises   Exercises  Knee/Hip      Knee/Hip Exercises: Aerobic   Stationary Bike  L2 x 5 min      Knee/Hip Exercises: Standing   Hip Flexion  20 reps;Knee bent;Both    Hip Abduction  2 sets;10 reps;Both    Forward Step Up  10 reps;Both;Step Height: 6";Hand Hold: 1    Functional Squat  20 reps    Stairs  5 steps x3, 1 hand rail,       Knee/Hip Exercises: Seated   Sit to Sand  5 reps               PT Short Term Goals - 01/28/20 1015      PT SHORT TERM GOAL #1   Title  Pt to be independent wtih initial HEP    Time  2    Period  Weeks    Status  New    Target Date  02/10/20      PT SHORT TERM GOAL #  2   Title  Pt to demo decreased speed with walking and direction changes in clinic for improved safety    Time  2    Period  Weeks    Status  New    Target Date  02/10/20        PT Long Term Goals - 01/28/20 1016      PT LONG TERM GOAL #1   Title  Pt to be independent with final HEP for LE strength and balance    Time  6    Period  Weeks    Status  New    Target Date  03/09/20      PT LONG TERM GOAL #2   Title  Pt to demo improved score on DGI by at least 2    Time  6    Period  Weeks    Status  New    Target Date  03/09/20      PT LONG TERM GOAL #3   Title  Pt to demo improved score on BERG by at least 3    Time  6    Period  Weeks    Status  New    Target Date  03/09/20      PT LONG TERM GOAL #4   Title  Pt to demo ability for safe ambulation, direction changes, and head turns, at safe/slower pace, to improve safety with home and community navigation, for up to 500 ft.    Time  6    Period  Weeks    Status  New    Target Date  03/09/20            Plan - 02/04/20 1550    Clinical Impression Statement  Pt cued for slow speed for all activities today. Requires max cuing for attention and focus on task. Pt challenged with balance activities, plan to progress difficulty and speed slowly/as  tolerated.    Personal Factors and Comorbidities  Comorbidity 1;Comorbidity 2    Comorbidities  Encephalopathy, memory deficits, orthostatic hypotension, impulsive    Examination-Activity Limitations  Bend;Squat;Stairs;Dressing;Lift;Locomotion Level    Examination-Participation Restrictions  Cleaning;Shop;Community Activity;Yard Work;Meal Prep    Stability/Clinical Decision Making  Evolving/Moderate complexity    Rehab Potential  Fair    PT Frequency  2x / week    PT Duration  6 weeks    PT Treatment/Interventions  ADLs/Self Care Home Management;Cryotherapy;Electrical Stimulation;Iontophoresis 4mg /ml Dexamethasone;Moist Heat;Therapeutic exercise;Therapeutic activities;Functional mobility training;Stair training;Gait training;DME Instruction;Ultrasound;Balance training;Neuromuscular re-education;Cognitive remediation;Patient/family education;Manual techniques;Vestibular;Taping;Splinting;Energy conservation;Dry needling;Passive range of motion;Scar mobilization;Visual/perceptual remediation/compensation;Spinal Manipulations;Joint Manipulations;Canalith Repostioning    Consulted and Agree with Plan of Care  Patient       Patient will benefit from skilled therapeutic intervention in order to improve the following deficits and impairments:  Abnormal gait, Decreased knowledge of precautions, Decreased strength, Decreased activity tolerance, Decreased balance, Decreased mobility, Difficulty walking, Impaired perceived functional ability, Decreased coordination, Decreased safety awareness  Visit Diagnosis: Other abnormalities of gait and mobility     Problem List Patient Active Problem List   Diagnosis Date Noted  . S/p reverse total shoulder arthroplasty 09/09/2019  . Neck pain 08/31/2017  . Encephalopathy 05/11/2017  . Orthostatic hypotension 05/01/2017  . Generalized weakness 04/30/2017  . Hypertension 04/30/2017  . Depression 04/30/2017  . Right arm pain 04/30/2017  . Anxiety 04/30/2017   . Passed out 02/06/2017  . Memory loss 02/06/2017    02/08/2017, PT, DPT 3:57 PM  02/04/20      [facility]  [address] [place], [place], [address] Phone: [phone]   Fax:  [phone]  Name: Elizabeth [name] MRN: [id number] Date of Birth: 1941-06-01

## 2020-02-06 ENCOUNTER — Ambulatory Visit: Payer: Medicare Other | Admitting: Physical Therapy

## 2020-02-06 ENCOUNTER — Other Ambulatory Visit: Payer: Self-pay

## 2020-02-06 DIAGNOSIS — R2689 Other abnormalities of gait and mobility: Secondary | ICD-10-CM

## 2020-02-09 ENCOUNTER — Encounter: Payer: Self-pay | Admitting: Physical Therapy

## 2020-02-09 NOTE — Therapy (Addendum)
[facility] [place], [place], [address] Phone: [phone]   Fax:  [phone]  Physical Therapy Treatment  Patient Details  Name: Elizabeth [name] MRN: [id number] Date of Birth: [birth year] Referring Provider (PT): Frann Rider   Encounter Date: 02/06/2020  PT End of Session - 02/09/20 1930    Visit Number  3    Number of Visits  12    Date for PT Re-Evaluation  03/09/20    Authorization Type  Medicare    Authorization - Visit Number  1    PT Start Time  3614    PT Stop Time  1513    PT Time Calculation (min)  38 min    Equipment Utilized During Treatment  Gait belt    Activity Tolerance  Patient tolerated treatment well    Behavior During Therapy  Impulsive       Past Medical History:  Diagnosis Date  . Allergic rhinitis   . Anxiety   . Arthritis    right shoulder  . Congenital defect    left arm  . Depression   . GERD (gastroesophageal reflux disease)   . Headache   . Hyperlipemia   . Hypertension   . IBS (irritable bowel syndrome)   . Insomnia   . Memory loss   . Vitamin D deficiency   . Wears glasses     Past Surgical History:  Procedure Laterality Date  . ABDOMINAL HYSTERECTOMY    . CATARACT EXTRACTION Bilateral   . NECK SURGERY    . REVERSE SHOULDER ARTHROPLASTY Right 09/09/2019   Procedure: REVERSE SHOULDER ARTHROPLASTY;  Surgeon: Verner Mould, MD;  Location: [facility];  Service: Orthopedics;  Laterality: Right;  149mn    There were no vitals filed for this visit.  Subjective Assessment - 02/09/20 1930    Subjective  Pt with no new complaints.    Currently in Pain?  No/denies                       [facility] Adult PT Treatment/Exercise - 02/09/20 0001      Ambulation/Gait   Gait Comments  outdoor ambulation on uneven grass, inclines, x300 ft, up/down curb x8; education for using vision for safety and slow speeds.       Neuro Re-ed    Neuro Re-ed Details   Side  stepping 25 ft x4;  Rhomberg stance w head turns L/R x20;  Tandem stance 30 sec x3 bil;        Exercises   Exercises  Knee/Hip      Knee/Hip Exercises: Aerobic   Stationary Bike  L2 x 7 min      Knee/Hip Exercises: Standing   Hip Flexion  20 reps;Knee bent;Both    Hip Flexion Limitations  2lb    Hip Abduction  2 sets;10 reps;Both    Forward Step Up  10 reps;Both;Step Height: 6";Hand Hold: 1    Stairs  5 steps x3, 1 hand rail,       Knee/Hip Exercises: Seated   Long Arc Quad  20 reps    Long Arc Quad Weight  2 lbs.    Sit to [employer] 10 reps               PT Short Term Goals - 01/28/20 1015      PT SHORT TERM GOAL #1   Title  Pt to be independent wtih initial HEP    Time  2    Period  Weeks    Status  New    Target Date  02/10/20      PT SHORT TERM GOAL #2   Title  Pt to demo decreased speed with walking and direction changes in clinic for improved safety    Time  2    Period  Weeks    Status  New    Target Date  02/10/20        PT Long Term Goals - 01/28/20 1016      PT LONG TERM GOAL #1   Title  Pt to be independent with final HEP for LE strength and balance    Time  6    Period  Weeks    Status  New    Target Date  03/09/20      PT LONG TERM GOAL #2   Title  Pt to demo improved score on DGI by at least 2    Time  6    Period  Weeks    Status  New    Target Date  03/09/20      PT LONG TERM GOAL #3   Title  Pt to demo improved score on BERG by at least 3    Time  6    Period  Weeks    Status  New    Target Date  03/09/20      PT LONG TERM GOAL #4   Title  Pt to demo ability for safe ambulation, direction changes, and head turns, at safe/slower pace, to improve safety with home and community navigation, for up to 500 ft.    Time  6    Period  Weeks    Status  New    Target Date  03/09/20            Plan - 02/09/20 1932    Clinical Impression Statement  LE ther ex progressed today. Pt req max cueing for strengthening and NMR, for speed  and safety. Pt challenged with outdoor ambulation surfaces, req contact guard for safety, but no LOB. Will benefit from continued care.    Personal Factors and Comorbidities  Comorbidity 1;Comorbidity 2    Comorbidities  Encephalopathy, memory deficits, orthostatic hypotension, impulsive    Examination-Activity Limitations  Bend;Squat;Stairs;Dressing;Lift;Locomotion Level    Examination-Participation Restrictions  Cleaning;Shop;Community Activity;Yard Work;Meal Prep    Stability/Clinical Decision Making  Evolving/Moderate complexity    Rehab Potential  Fair    PT Frequency  2x / week    PT Duration  6 weeks    PT Treatment/Interventions  ADLs/Self Care Home Management;Cryotherapy;Electrical Stimulation;Iontophoresis '4mg'$ /ml Dexamethasone;Moist Heat;Therapeutic exercise;Therapeutic activities;Functional mobility training;Stair training;Gait training;DME Instruction;Ultrasound;Balance training;Neuromuscular re-education;Cognitive remediation;Patient/family education;Manual techniques;Vestibular;Taping;Splinting;Energy conservation;Dry needling;Passive range of motion;Scar mobilization;Visual/perceptual remediation/compensation;Spinal Manipulations;Joint Manipulations;Canalith Repostioning    Consulted and Agree with Plan of Care  Patient       Patient will benefit from skilled therapeutic intervention in order to improve the following deficits and impairments:  Abnormal gait, Decreased knowledge of precautions, Decreased strength, Decreased activity tolerance, Decreased balance, Decreased mobility, Difficulty walking, Impaired perceived functional ability, Decreased coordination, Decreased safety awareness  Visit Diagnosis: Other abnormalities of gait and mobility     Problem List Patient Active Problem List   Diagnosis Date Noted  . S/p reverse total shoulder arthroplasty 09/09/2019  . Neck pain 08/31/2017  . Encephalopathy 05/11/2017  . Orthostatic hypotension 05/01/2017  . Generalized  weakness 04/30/2017  . Hypertension 04/30/2017  . Depression 04/30/2017  . Right arm pain  04/30/2017  . Anxiety 04/30/2017  . Passed out 02/06/2017  . Memory loss 02/06/2017    Lyndee Hensen, PT, DPT 7:36 PM  02/09/20    [facility] [place], [place], [address] Phone: [phone]   Fax:  [phone]  Name: SHAHIDAH NESBITT MRN: [id number] Date of Birth: [birth year]  PHYSICAL THERAPY DISCHARGE SUMMARY  Visits from Start of Care: 3  Plan: Patient agrees to discharge.  Patient goals were not met. Patient is being discharged due to not returning since the last visit.  ?????     Lyndee Hensen, PT, DPT 3:04 PM  12/23/20

## 2020-02-11 ENCOUNTER — Encounter: Payer: Medicare Other | Admitting: Physical Therapy

## 2020-02-13 ENCOUNTER — Encounter: Payer: Medicare Other | Admitting: Physical Therapy

## 2020-02-14 DIAGNOSIS — Q7132 Congenital absence of left hand and finger: Secondary | ICD-10-CM | POA: Diagnosis not present

## 2020-02-14 DIAGNOSIS — F341 Dysthymic disorder: Secondary | ICD-10-CM | POA: Diagnosis not present

## 2020-02-14 DIAGNOSIS — E559 Vitamin D deficiency, unspecified: Secondary | ICD-10-CM | POA: Diagnosis not present

## 2020-02-14 DIAGNOSIS — I10 Essential (primary) hypertension: Secondary | ICD-10-CM | POA: Diagnosis not present

## 2020-02-14 DIAGNOSIS — F419 Anxiety disorder, unspecified: Secondary | ICD-10-CM | POA: Diagnosis not present

## 2020-02-14 DIAGNOSIS — Z0000 Encounter for general adult medical examination without abnormal findings: Secondary | ICD-10-CM | POA: Diagnosis not present

## 2020-02-14 DIAGNOSIS — Z1211 Encounter for screening for malignant neoplasm of colon: Secondary | ICD-10-CM | POA: Diagnosis not present

## 2020-02-14 DIAGNOSIS — E7800 Pure hypercholesterolemia, unspecified: Secondary | ICD-10-CM | POA: Diagnosis not present

## 2020-02-18 ENCOUNTER — Encounter: Payer: Medicare Other | Admitting: Physical Therapy

## 2020-02-20 ENCOUNTER — Encounter: Payer: Medicare Other | Admitting: Physical Therapy

## 2020-02-25 ENCOUNTER — Encounter: Payer: Medicare Other | Admitting: Physical Therapy

## 2020-02-27 ENCOUNTER — Encounter: Payer: Medicare Other | Admitting: Physical Therapy

## 2020-03-03 DIAGNOSIS — Z1211 Encounter for screening for malignant neoplasm of colon: Secondary | ICD-10-CM | POA: Diagnosis not present

## 2020-03-03 IMAGING — DX DG SHOULDER 2+V PORT*R*
1 series · 1 of 1 positions shown · non-contrast
Comparison: 02/08/2019.

CLINICAL DATA: Right shoulder arthroplasty.

EXAM:
PORTABLE RIGHT SHOULDER

[shoulder]
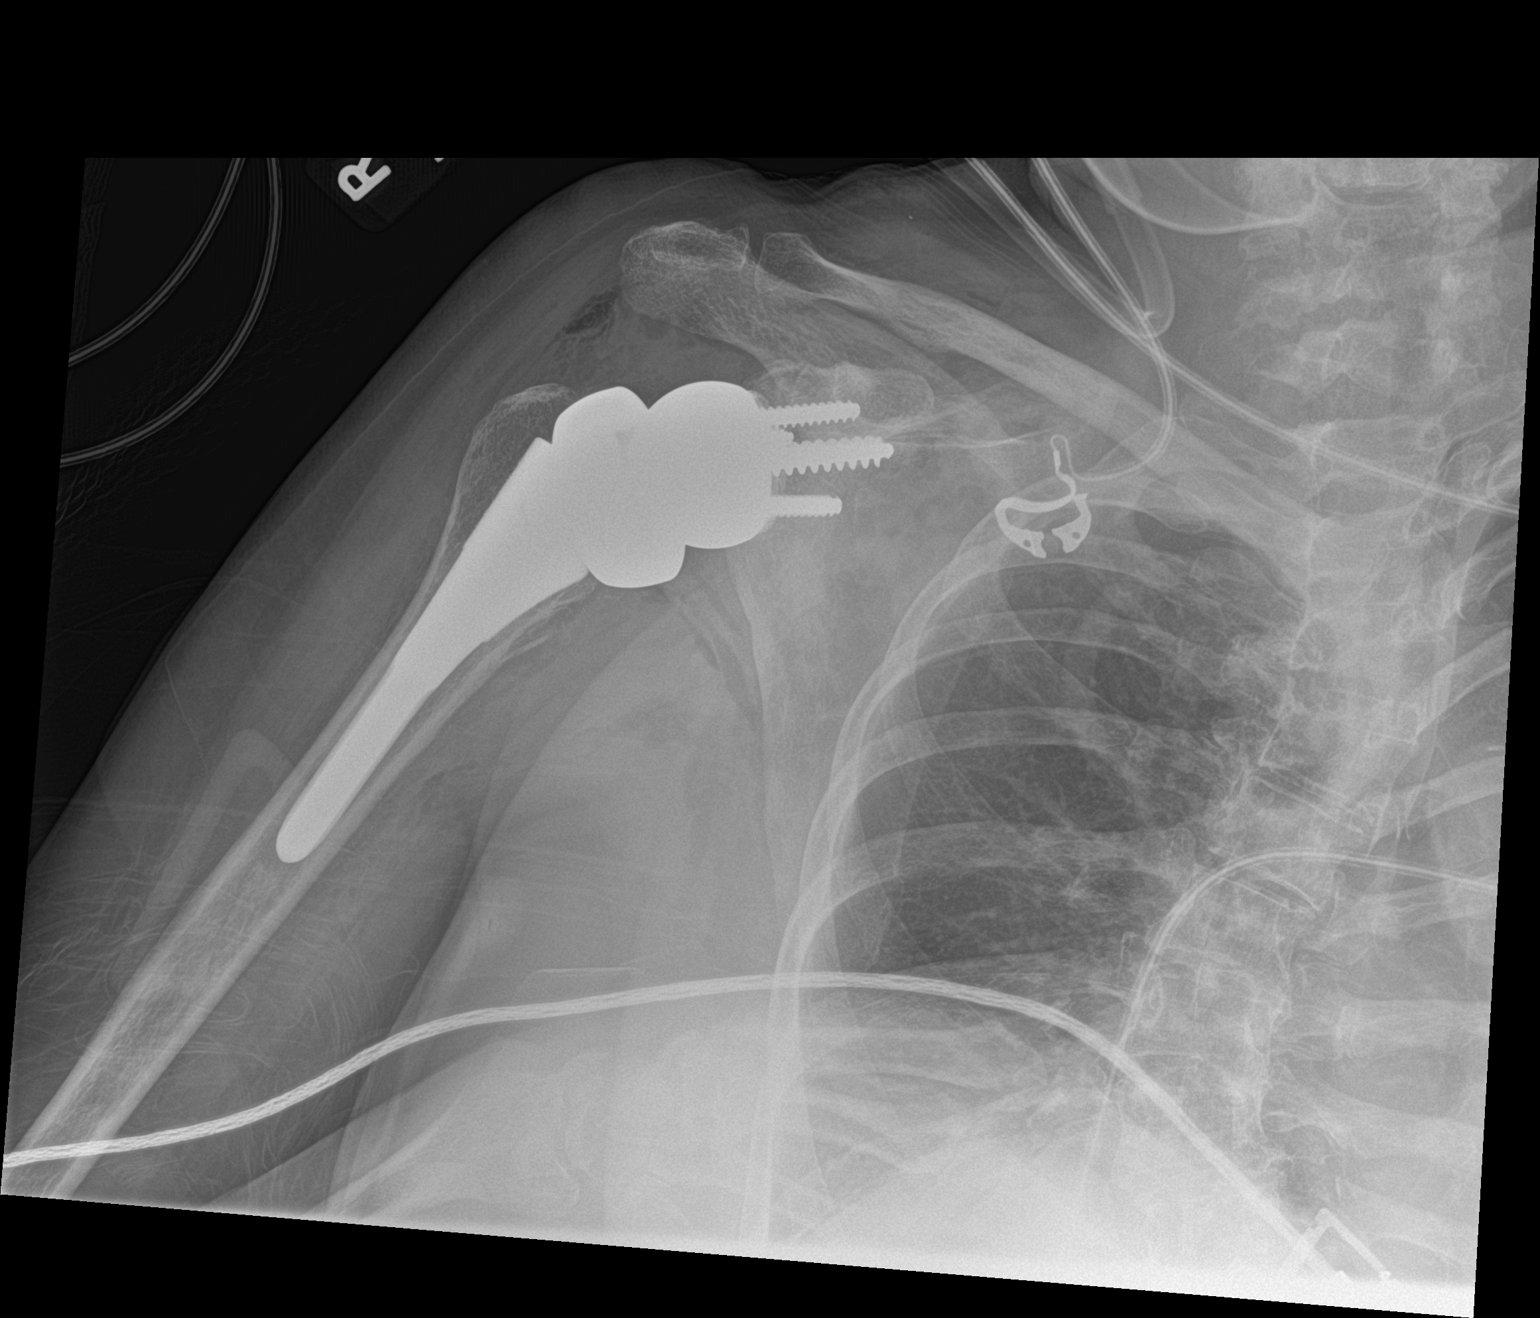

[1 of 1 positions shown; findings below may reference images not displayed]

FINDINGS: Total right shoulder replacement. Hardware intact. Anatomic
alignment. Acromioclavicular degenerative change. No acute bony
abnormality.
IMPRESSION: Total right shoulder replacement with anatomic alignment.

## 2020-03-20 ENCOUNTER — Emergency Department (HOSPITAL_COMMUNITY)
Admission: EM | Admit: 2020-03-20 | Discharge: 2020-03-20 | Disposition: A | Discharge status: Home / Self Care | Payer: Medicare Other | Attending: Emergency Medicine | Admitting: Emergency Medicine

## 2020-03-20 ENCOUNTER — Encounter (HOSPITAL_COMMUNITY): Payer: Self-pay | Admitting: *Deleted

## 2020-03-20 ENCOUNTER — Other Ambulatory Visit: Payer: Self-pay

## 2020-03-20 DIAGNOSIS — Y929 Unspecified place or not applicable: Secondary | ICD-10-CM | POA: Insufficient documentation

## 2020-03-20 DIAGNOSIS — S51011A Laceration without foreign body of right elbow, initial encounter: Secondary | ICD-10-CM | POA: Diagnosis not present

## 2020-03-20 DIAGNOSIS — Y93K1 Activity, walking an animal: Secondary | ICD-10-CM | POA: Diagnosis not present

## 2020-03-20 DIAGNOSIS — S80211A Abrasion, right knee, initial encounter: Secondary | ICD-10-CM | POA: Diagnosis not present

## 2020-03-20 DIAGNOSIS — W010XXA Fall on same level from slipping, tripping and stumbling without subsequent striking against object, initial encounter: Secondary | ICD-10-CM | POA: Diagnosis not present

## 2020-03-20 DIAGNOSIS — Y999 Unspecified external cause status: Secondary | ICD-10-CM | POA: Diagnosis not present

## 2020-03-20 DIAGNOSIS — Z23 Encounter for immunization: Secondary | ICD-10-CM | POA: Insufficient documentation

## 2020-03-20 MED ORDER — SULFAMETHOXAZOLE-TRIMETHOPRIM 800-160 MG PO TABS
1.0000 | ORAL_TABLET | Freq: Once | ORAL | Status: AC
  Administered 2020-03-20: 20:00:00 1 via ORAL
  Filled 2020-03-20: qty 1

## 2020-03-20 MED ORDER — LIDOCAINE-EPINEPHRINE 2 %-1:100000 IJ SOLN
20.0000 mL | Freq: Once | INTRAMUSCULAR | Status: AC
  Administered 2020-03-20: 20:00:00 20 mL

## 2020-03-20 MED ORDER — TETANUS-DIPHTH-ACELL PERTUSSIS 5-2.5-18.5 LF-MCG/0.5 IM SUSP
0.5000 mL | Freq: Once | INTRAMUSCULAR | Status: AC
  Administered 2020-03-20: 20:00:00 0.5 mL via INTRAMUSCULAR
  Filled 2020-03-20: qty 0.5

## 2020-03-20 MED ORDER — SULFAMETHOXAZOLE-TRIMETHOPRIM 800-160 MG PO TABS: 1.0000 | ORAL_TABLET | Freq: Two times a day (BID) | ORAL | 0 refills | Status: AC

## 2020-03-20 MED ORDER — LIDOCAINE HCL 2 % IJ SOLN
INTRAMUSCULAR | Status: AC
  Administered 2020-03-20: 20:00:00 400 mg
  Filled 2020-03-20: qty 20

## 2020-03-20 NOTE — ED Provider Notes (Signed)
[facility]-EMERGENCY DEPT Provider Note   CSN: [id number] Arrival date & time: 03/20/20  1728     History Chief Complaint  Patient presents with  . Fall    Elizabeth [name] is a 79 y.o. female history of hypertension, hyperlipidemia, congenital loss of left hand who presented with fall.  Patient states that she was walking the dog and tripped and fell onto the right elbow.  She states that she has a laceration of the right elbow that was bleeding.  She states that she is able to move the elbow with no pain.  She also has an abrasion on the right knee.  However she states that she is able to walk on that leg.  She denies any head injury or loss consciousness.  She does not remember when her last tetanus shot was.  She does have chronic gait instability and has been seen by physical therapy.  Patient states that she does fall frequently.  The history is provided by the patient.       Past Medical History:  Diagnosis Date  . Allergic rhinitis   . Anxiety   . Arthritis    right shoulder  . Congenital defect    left arm  . Depression   . GERD (gastroesophageal reflux disease)   . Headache   . Hyperlipemia   . Hypertension   . IBS (irritable bowel syndrome)   . Insomnia   . Memory loss   . Vitamin D deficiency   . Wears glasses     Patient Active Problem List   Diagnosis Date Noted  . S/p reverse total shoulder arthroplasty 09/09/2019  . Neck pain 08/31/2017  . Encephalopathy 05/11/2017  . Orthostatic hypotension 05/01/2017  . Generalized weakness 04/30/2017  . Hypertension 04/30/2017  . Depression 04/30/2017  . Right arm pain 04/30/2017  . Anxiety 04/30/2017  . Passed out 02/06/2017  . Memory loss 02/06/2017    Past Surgical History:  Procedure Laterality Date  . ABDOMINAL HYSTERECTOMY    . CATARACT EXTRACTION Bilateral   . NECK SURGERY    . REVERSE SHOULDER ARTHROPLASTY Right 09/09/2019   Procedure: REVERSE SHOULDER ARTHROPLASTY;  Surgeon:  Ernest Mallick, MD;  Location: [facility] OR;  Service: Orthopedics;  Laterality: Right;      OB History   No obstetric history on file.     Family History  Adopted: Yes  Problem Relation Age of Onset  . Breast cancer Mother   . Other Father        unsure of history    Social History   Tobacco Use  . Smoking status: Never Smoker  . Smokeless tobacco: Never Used  Substance Use Topics  . Alcohol use: No  . Drug use: No    Home Medications Prior to Admission medications   Medication Sig Start Date End Date Taking? Authorizing Provider  cyclobenzaprine (FLEXERIL) 5 MG tablet Take 1 tablet (5 mg total) by mouth 2 (two) times daily as needed for muscle spasms. Patient taking differently: Take 5 mg by mouth 2 (two) times daily.  07/04/19   Ihor Austin, NP  diphenhydramine-acetaminophen (TYLENOL PM) 25-500 MG TABS tablet Take 3 tablets by mouth at bedtime.    [provider]  DULoxetine (CYMBALTA) 60 MG capsule Take 1 capsule (60 mg total) by mouth daily. 07/04/19   Ihor Austin, NP  HYDROcodone-acetaminophen (NORCO/VICODIN) 5-325 MG tablet Take 1-2 tablets by mouth every 6 (six) hours as needed for moderate pain (pain score  4-6). 09/10/19   Avanell Shackleton III, MD  metoprolol succinate (TOPROL-XL) 50 MG 24 hr tablet Take 75 mg by mouth 2 (two) times daily. Take with or immediately following a meal.  "takes 1.5 of the pills."    [provider]    Allergies    Norvasc [amlodipine], Benicar [olmesartan], and Lisinopril  Review of Systems   Review of Systems  Skin: Positive for wound.  All other systems reviewed and are negative.   Physical Exam Updated Vital Signs BP (!) 146/85 (BP Location: Left Arm)   Pulse 80   Temp 98.3 F (36.8 C) (Oral)   Ht 5\' 3"  (1.6 m)   Wt 59 kg   SpO2 96%   BMI 23.03 kg/m   Physical Exam Vitals and nursing note reviewed.  HENT:     Head: Normocephalic.     Comments: No scalp hematoma, old L facial  ecchymosis with no obvious bony tenderness.    Mouth/Throat:     Mouth: Mucous membranes are moist.  Eyes:     Extraocular Movements: Extraocular movements intact.     Pupils: Pupils are equal, round, and reactive to light.  Neck:     Comments: No midline tenderness  Cardiovascular:     Rate and Rhythm: Normal rate.     Pulses: Normal pulses.     Heart sounds: Normal heart sounds.  Pulmonary:     Effort: Pulmonary effort is normal.     Breath sounds: Normal breath sounds.  Abdominal:     General: Abdomen is flat.     Palpations: Abdomen is soft.  Musculoskeletal:     Cervical back: Normal range of motion.     Comments: 5 cm right elbow laceration.  Patient has no bony tenderness. The edges are slightly irregular.  Patient has normal range of motion of the right elbow.  Patient has an abrasion of the right knee with normal range of motion of the right knee.  Patient is able to ambulate.  Has no midline cervical or spinal tenderness.  Has no pelvic tenderness.  Skin:    General: Skin is warm.     Capillary Refill: Capillary refill takes less than 2 seconds.  Neurological:     General: No focal deficit present.     Mental Status: She is alert.  Psychiatric:        Mood and Affect: Mood normal.        Behavior: Behavior normal.     ED Results / Procedures / Treatments   Labs (all labs ordered are listed, but only abnormal results are displayed) Labs Reviewed - No data to display  EKG None  Radiology No results found.  Procedures Procedures (including critical care time)  LACERATION REPAIR Performed by: Wandra Arthurs Authorized by: Wandra Arthurs Consent: Verbal consent obtained. Risks and benefits: risks, benefits and alternatives were discussed Consent given by: patient Patient identity confirmed: provided demographic data Prepped and Draped in normal sterile fashion Wound explored  Laceration Location: R elbow  Laceration Length: 5 cm  No Foreign Bodies seen or  palpated  Anesthesia: local infiltration  Local anesthetic: lidocaine 2 % no epinephrine  Anesthetic total: 10 ml  Irrigation method: syringe Amount of cleaning: standard  Skin closure: 4-0 ethilon  Number of sutures: 6  Technique: simple interrupted   Patient tolerance: Patient tolerated the procedure well with no immediate complications.   Medications Ordered in ED Medications  Tdap (BOOSTRIX) injection 0.5 mL (has no administration in  time range)  lidocaine-EPINEPHrine (XYLOCAINE W/EPI) 2 %-1:100000 (with pres) injection 20 mL (has no administration in time range)  lidocaine (XYLOCAINE) 2 % (with pres) injection (has no administration in time range)  sulfamethoxazole-trimethoprim (BACTRIM DS) 800-160 MG per tablet 1 tablet (has no administration in time range)    ED Course  I have reviewed the triage vital signs and the nursing notes.  Pertinent labs & imaging results that were available during my care of the patient were reviewed by me and considered in my medical decision making (see chart for details).    MDM Rules/Calculators/A&P                      Elizabeth [name] is a 79 y.o. female presenting with right elbow laceration and right knee abrasion after fall.  Will update tetanus.  Patient has no bony tenderness and I doubt fracture so we will not in x-ray.  Will suture laceration and give prophylactic antibiotics given wound appears unclean.  7:52 PM Laceration sutured. Will dc home with bactrim.  Suture removal in 7 to 10 days.  Gave strict return precautions.  Final Clinical Impression(s) / ED Diagnoses Final diagnoses:  None    Rx / DC Orders ED Discharge Orders    None       Charlynne Pander, MD 03/20/20 [phone]

## 2020-03-20 NOTE — Discharge Instructions (Signed)
Keep wound clean and dry.  Keep the dressing on for 2 days then you can replace it with a Band-Aid.  Take Bactrim twice daily for 5 days to prevent infection.  You need suture removal in 7 to 10 days.  Return to ER if you have uncontrolled bleeding, severe elbow or knee pain, fever.

## 2020-03-20 NOTE — ED Triage Notes (Signed)
Walking dog and fell, lac to elbow, abrasion to rt knee. No LOC

## 2020-03-23 DIAGNOSIS — S51001D Unspecified open wound of right elbow, subsequent encounter: Secondary | ICD-10-CM | POA: Diagnosis not present

## 2020-07-06 ENCOUNTER — Ambulatory Visit: Payer: Medicare Other | Admitting: Adult Health

## 2020-07-06 ENCOUNTER — Encounter: Payer: Self-pay | Admitting: Adult Health

## 2020-07-06 NOTE — Progress Notes (Deleted)
PATIENT: Elizabeth [name] DOB: 20-Mar-1941    [facility] provider: Dr. Terrace Arabia REASON FOR VISIT: follow up neck pain HISTORY FROM: patient  No chief complaint on file.    HPI:  Today, 07/06/2020, Elizabeth [name] returns for 8-month follow-up regarding neck pain/occipital headache, chronic gait instability and mild cognitive impairment.     Chronic gait abnormality: Gradual onset since 2013.  3 sessions with PT with last session 02/06/2020.  Evaluated in ED on 03/20/2020 post fall with elbow abrasion but otherwise no other injuries.  Neck pain/occipital headache: Stable.  Continues on Cymbalta and Flexeril without side effects  Mild cognitive impairment, anxiety: Stable     History provided for reference purposes only Update 01/07/2020 JM Elizabeth [name] is a 79 year old female who is being seen today for 73-month follow-up with prior visit 06/2019.  Ongoing use of Cymbalta and and flexeril with ongoing benefit regarding neck pain and anxiety.  She did undergo right reverse total shoulder arthroplasty on 09/09/2019 due to osteoarthritis tolerated well without complication.  She does endorse increased falls since the holidays and is unsure if this is due to tripping or loss of balance.  Falls typically occur when ambulating on uneven surfaces or walking her dog.  She does have left orbital ecchymosis from prior fall.  Denies loss of consciousness or any other injuries.  She is interested in participating in outpatient PT for possible deconditioning.  At prior visit, she was referred to behavioral health but she did not make initial evaluation as she was not interested in virtual visits.  She does states she will participate when she is able to be seen in the office.  Memory has been stable.  No further concerns at this time.   History: Elizabeth [name] is a 79 year old female with PMH  hypertension, orthostatic hypotension, depression anxiety, chronic insomnia, hyperlipidemia, congenital left arm defect,  vitamin D deficiency, cervical decompression more than 30 years ago with benefit.  Initially evaluated by Dr. Terrace Arabia on 02/06/2017 after PCP referred to our office for evaluation of memory loss and head injury with ongoing complaints of memory disturbance, altered mental status, generalized weakness and speech difficulty.  In 11/2016, had a fall resulting in abrasion and transient loss of consciousness but no further injuries. She reported gradual onset gait abnormality since 2013, fell often, legs give out underneath her often, right since to be more frequent, she denies bilateral feet paresthesia, denies bowel and bladder incontinence.  MRI brain 02/2017-generalized atrophy supratentorium small vessel disease EEG 05/2017 mild generalized slowing during admission acute encephalopathy EMG/NCV 07/2017 moderate right carpal tunnel syndrome MRI cervical spine 11/2016 no evidence of nerve root or spinal cord compression with only mild to moderate DJD CT head 01/2019 traumatic right frontal lobe parenchymal hemorrhage post fall      REVIEW OF SYSTEMS: Out of a complete 14 system review of symptoms, the patient complains only of the following symptoms, and all other reviewed systems are negative. Depression, anxiety increased falls and neck pain   ALLERGIES: Allergies  Allergen Reactions   Norvasc [Amlodipine] Swelling    Pt not aware of    Benicar [Olmesartan] Other (See Comments)    Nervousness Pt not aware of    Lisinopril Cough    Pt not aware of     HOME MEDICATIONS: Outpatient Medications Prior to Visit  Medication Sig Dispense Refill   cyclobenzaprine (FLEXERIL) 5 MG tablet Take 1 tablet (5 mg total) by mouth 2 (two) times daily as needed for  muscle spasms. (Patient taking differently: Take 5 mg by mouth 2 (two) times daily. ) 180 tablet 3   diphenhydramine-acetaminophen (TYLENOL PM) 25-500 MG TABS tablet Take 3 tablets by mouth at bedtime.     DULoxetine (CYMBALTA) 60 MG capsule Take  1 capsule (60 mg total) by mouth daily. 90 capsule 3   HYDROcodone-acetaminophen (NORCO/VICODIN) 5-325 MG tablet Take 1-2 tablets by mouth every 6 (six) hours as needed for moderate pain (pain score 4-6). 35 tablet 0   metoprolol succinate (TOPROL-XL) 50 MG 24 hr tablet Take 75 mg by mouth 2 (two) times daily. Take with or immediately following a meal.  "takes 1.5 of the pills."     No facility-administered medications prior to visit.    PAST MEDICAL HISTORY: Past Medical History:  Diagnosis Date   Allergic rhinitis    Anxiety    Arthritis    right shoulder   Congenital defect    left arm   Depression    GERD (gastroesophageal reflux disease)    Headache    Hyperlipemia    Hypertension    IBS (irritable bowel syndrome)    Insomnia    Memory loss    Vitamin D deficiency    Wears glasses     PAST SURGICAL HISTORY: Past Surgical History:  Procedure Laterality Date   ABDOMINAL HYSTERECTOMY     CATARACT EXTRACTION Bilateral    NECK SURGERY     REVERSE SHOULDER ARTHROPLASTY Right 09/09/2019   Procedure: REVERSE SHOULDER ARTHROPLASTY;  Surgeon: Ernest Mallickreighton, James J III, MD;  Location: [facility] OR;  Service: Orthopedics;  Laterality: Right;  120min    FAMILY HISTORY: Family History  Adopted: Yes  Problem Relation Age of Onset   Breast cancer Mother    Other Father        unsure of history    SOCIAL HISTORY: Social History   Socioeconomic History   Marital status: Widowed    Spouse name: Not on file   Number of children: 1   Years of education: HS   Highest education level: Not on file  Occupational History   Occupation: Retired  Tobacco Use   Smoking status: Never Smoker   Smokeless tobacco: Never Used  Building services engineerVaping Use   Vaping Use: Never used  Substance and Sexual Activity   Alcohol use: No   Drug use: No   Sexual activity: Not on file  Other Topics Concern   Not on file  Social History Narrative   [facility]  this time.  Daughter Erskine SquibbJane and Victorino DikeJennifer.    Right-handed.   No caffeine use.   Social Determinants of Health   Financial Resource Strain:    Difficulty of Paying Living Expenses:   Food Insecurity:    Worried About Programme researcher, broadcasting/film/videounning Out of Food in the Last Year:    Baristaan Out of Food in the Last Year:   Transportation Needs:    Freight forwarderLack of Transportation (Medical):    Lack of Transportation (Non-Medical):   Physical Activity:    Days of Exercise per Week:    Minutes of Exercise per Session:   Stress:    Feeling of Stress :   Social Connections:    Frequency of Communication with Friends and Family:    Frequency of Social Gatherings with Friends and Family:    Attends Religious Services:    Active Member of Clubs or Organizations:    Attends BankerClub or Organization Meetings:    Marital Status:   Intimate Partner Violence:  Fear of Current or Ex-Partner:    Emotionally Abused:    Physically Abused:    Sexually Abused:       PHYSICAL EXAM  There were no vitals filed for this visit. There is no height or weight on file to calculate BMI.  General: well developed, well nourished,  pleasant elderly Caucasian female, seated, in no evident distress Head: head normocephalic and atraumatic.   Neck: supple with no carotid or supraclavicular bruits Cardiovascular: regular rate and rhythm, no murmurs Musculoskeletal: no deformity Skin:  no rash/petichiae Vascular:  Normal pulses all extremities   Neurologic Exam Mental Status: Awake and fully alert. Oriented to place and time. Recent and remote memory intact. Attention span, concentration and fund of knowledge appropriate. Mood and affect appropriate.  Cranial Nerves: Pupils equal, briskly reactive to light. Extraocular movements full without nystagmus. Visual fields full to confrontation. Hearing intact. Facial sensation intact. Face, tongue, palate moves normally and symmetrically.  Motor: Normal bulk and tone. Normal strength  in all tested extremity muscles. Sensory.: intact to touch , pinprick , position and vibratory sensation.  Coordination: Rapid alternating movements normal in all extremities. Finger-to-nose and heel-to-shin performed accurately bilaterally. Gait and Station: Arises from chair without difficulty. Stance is normal. Gait demonstrates normal stride length and mild imbalance with difficulty performing tandem walk and walking on heels but is able to ambulate on toes without great difficulty Reflexes: 1+ and symmetric. Toes downgoing.      DIAGNOSTIC DATA (LABS, IMAGING, TESTING) - I reviewed patient records, labs, notes, testing and imaging myself where available.  Lab Results  Component Value Date   WBC 11.5 (H) 09/10/2019   HGB 11.9 (L) 09/10/2019   HCT 37.1 09/10/2019   MCV 87.5 09/10/2019   PLT 253 09/10/2019      Component Value Date/Time   NA 137 09/10/2019 0549   NA 141 02/06/2017 1616   K 3.8 09/10/2019 0549   CL 102 09/10/2019 0549   CO2 26 09/10/2019 0549   GLUCOSE 133 (H) 09/10/2019 0549   BUN 17 09/10/2019 0549   BUN 13 02/06/2017 1616   CREATININE 0.84 09/10/2019 0549   CALCIUM 9.1 09/10/2019 0549   PROT 5.9 (L) 05/01/2017 0715   PROT 6.7 02/06/2017 1616   ALBUMIN 4.0 05/12/2017 1053   AST 23 05/01/2017 0715   ALT 19 05/01/2017 0715   ALKPHOS 63 05/01/2017 0715   BILITOT 0.5 05/01/2017 0715   BILITOT <0.2 02/06/2017 1616   GFRNONAA >60 09/10/2019 0549   GFRAA >60 09/10/2019 0549   Lab Results  Component Value Date   VITAMINB12 504 08/31/2017   Lab Results  Component Value Date   TSH 2.640 08/31/2017      ASSESSMENT AND PLAN 79 y.o. year old female  has a past medical history of Allergic rhinitis, Anxiety, Arthritis, Congenital defect, Depression, GERD (gastroesophageal reflux disease), Headache, Hyperlipemia, Hypertension, IBS (irritable bowel syndrome), Insomnia, Memory loss, Vitamin D deficiency, and Wears glasses. here with:  1.  Neck pain 2.   Recent falls 3.  Memory loss 3.  Anxiety/depression  Continuation of Flexeril 5 mg twice daily and Cymbalta 60 mg daily for ongoing benefit of neck pain.  Referral placed to outpatient PT for increased falls likely due to deconditioning and imbalance.  Referral will be placed for psychiatry once COVID-19 restrictions are lifted as patient declines interest in virtual visit.  Depression/anxiety has been overall stable.  Memory loss has been stable.    She will follow-up in 6 months or  call earlier if needed  Greater than 50% of time during this 20 minute visit was spent on counseling, review neck pain and the continued use of Cymbalta and Flexeril, planning of further management, discussion regarding recent falls and benefit of PT and answered all questions to patient satisfaction   Ihor Austin, [facility]  [facility] [address] [place], [place] [address]  Phone [phone] Fax [phone] Note: This document was prepared with digital dictation and possible smart phrase technology. Any transcriptional errors that result from this process are unintentional.

## 2020-07-13 ENCOUNTER — Other Ambulatory Visit: Payer: Self-pay | Admitting: Adult Health

## 2020-07-14 ENCOUNTER — Ambulatory Visit (INDEPENDENT_AMBULATORY_CARE_PROVIDER_SITE_OTHER): Payer: Medicare Other | Admitting: Adult Health

## 2020-07-14 ENCOUNTER — Encounter: Payer: Self-pay | Admitting: Adult Health

## 2020-07-14 VITALS — BP 156/80 | HR 69 | Ht 63.0 in | Wt 129.0 lb

## 2020-07-14 DIAGNOSIS — M542 Cervicalgia: Secondary | ICD-10-CM | POA: Diagnosis not present

## 2020-07-14 DIAGNOSIS — R2681 Unsteadiness on feet: Secondary | ICD-10-CM | POA: Diagnosis not present

## 2020-07-14 DIAGNOSIS — F418 Other specified anxiety disorders: Secondary | ICD-10-CM

## 2020-07-14 MED ORDER — DULOXETINE HCL 60 MG PO CPEP: 60.0000 mg | ORAL_CAPSULE | Freq: Every day | ORAL | 3 refills | Status: DC

## 2020-07-14 NOTE — Progress Notes (Signed)
PATIENT: Elizabeth [name] DOB: 06-Aug-1941    [facility] provider: Dr. Terrace Arabia REASON FOR VISIT: follow up neck pain HISTORY FROM: patient  Chief Complaint  Patient presents with  . Follow-up    tx rm here for a f/u on neck pain and medication refill     HPI:  Today,07/14/2020, Elizabeth [name] returns for 38-month follow-up regarding neck pain/occipital headache, chronic gait instability and mild cognitive impairment.   Chronic gait abnormality: Gradual onset since 2013.  3 sessions with PT with last session 02/06/2020.  She has had a couple falls since prior visit with minor skin abrasion but otherwise no other injury.  She denies losing balance or legs giving out but more due to tripping or not lifting her legs high enough  Neck pain/occipital headache: Stable.  Ongoing benefit of medication.  Continues on Cymbalta and Flexeril without side effects  Mild cognitive impairment stable without worsening.  Does report continued depression and anxiety and is interested in referral to behavioral health     History provided for reference purposes only Update 01/07/2020 JM Elizabeth [name] is a 79 year old female who is being seen today for 58-month follow-up with prior visit 06/2019.  Ongoing use of Cymbalta and and flexeril with ongoing benefit regarding neck pain and anxiety.  She did undergo right reverse total shoulder arthroplasty on 09/09/2019 due to osteoarthritis tolerated well without complication.  She does endorse increased falls since the holidays and is unsure if this is due to tripping or loss of balance.  Falls typically occur when ambulating on uneven surfaces or walking her dog.  She does have left orbital ecchymosis from prior fall.  Denies loss of consciousness or any other injuries.  She is interested in participating in outpatient PT for possible deconditioning.  At prior visit, she was referred to behavioral health but she did not make initial evaluation as she was not interested in  virtual visits.  She does states she will participate when she is able to be seen in the office.  Memory has been stable.  No further concerns at this time.   History: Elizabeth [name] is a 79 year old female with PMH  hypertension, orthostatic hypotension, depression anxiety, chronic insomnia, hyperlipidemia, congenital left arm defect, vitamin D deficiency, cervical decompression more than 30 years ago with benefit.  Initially evaluated by Dr. Terrace Arabia on 02/06/2017 after PCP referred to our office for evaluation of memory loss and head injury with ongoing complaints of memory disturbance, altered mental status, generalized weakness and speech difficulty.  In 11/2016, had a fall resulting in abrasion and transient loss of consciousness but no further injuries. She reported gradual onset gait abnormality since 2013, fell often, legs give out underneath her often, right since to be more frequent, she denies bilateral feet paresthesia, denies bowel and bladder incontinence.  MRI brain 02/2017-generalized atrophy supratentorium small vessel disease EEG 05/2017 mild generalized slowing during admission acute encephalopathy EMG/NCV 07/2017 moderate right carpal tunnel syndrome MRI cervical spine 11/2016 no evidence of nerve root or spinal cord compression with only mild to moderate DJD CT head 01/2019 traumatic right frontal lobe parenchymal hemorrhage post fall      REVIEW OF SYSTEMS: Out of a complete 14 system review of symptoms, the patient complains only of the following symptoms, and all other reviewed systems are negative. Depression, anxiety, gait impairment and neck pain   ALLERGIES: Allergies  Allergen Reactions  . Norvasc [Amlodipine] Swelling    Pt not aware of   . Benicar [  Olmesartan] Other (See Comments)    Nervousness Pt not aware of   . Lisinopril Cough    Pt not aware of     HOME MEDICATIONS: Outpatient Medications Prior to Visit  Medication Sig Dispense Refill  . cyclobenzaprine  (FLEXERIL) 5 MG tablet Take 1 tablet (5 mg total) by mouth 2 (two) times daily as needed for muscle spasms. (Patient taking differently: Take 5 mg by mouth 2 (two) times daily. ) 180 tablet 3  . diphenhydramine-acetaminophen (TYLENOL PM) 25-500 MG TABS tablet Take 3 tablets by mouth at bedtime.    . DULoxetine (CYMBALTA) 60 MG capsule Take 1 capsule (60 mg total) by mouth daily. 90 capsule 3  . HYDROcodone-acetaminophen (NORCO/VICODIN) 5-325 MG tablet Take 1-2 tablets by mouth every 6 (six) hours as needed for moderate pain (pain score 4-6). 35 tablet 0  . metoprolol succinate (TOPROL-XL) 50 MG 24 hr tablet Take 75 mg by mouth 2 (two) times daily. Take with or immediately following a meal.  "takes 1.5 of the pills."     No facility-administered medications prior to visit.    PAST MEDICAL HISTORY: Past Medical History:  Diagnosis Date  . Allergic rhinitis   . Anxiety   . Arthritis    right shoulder  . Congenital defect    left arm  . Depression   . GERD (gastroesophageal reflux disease)   . Headache   . Hyperlipemia   . Hypertension   . IBS (irritable bowel syndrome)   . Insomnia   . Memory loss   . Vitamin D deficiency   . Wears glasses     PAST SURGICAL HISTORY: Past Surgical History:  Procedure Laterality Date  . ABDOMINAL HYSTERECTOMY    . CATARACT EXTRACTION Bilateral   . NECK SURGERY    . REVERSE SHOULDER ARTHROPLASTY Right 09/09/2019   Procedure: REVERSE SHOULDER ARTHROPLASTY;  Surgeon: Ernest Mallick, MD;  Location: [facility];  Service: Orthopedics;  Laterality: Right;     FAMILY HISTORY: Family History  Adopted: Yes  Problem Relation Age of Onset  . Breast cancer Mother   . Other Father        unsure of history    SOCIAL HISTORY: Social History   Socioeconomic History  . Marital status: Widowed    Spouse name: Not on file  . Number of children: 1  . Years of education: HS  . Highest education level: Not on file  Occupational History  .  Occupation: Retired  Tobacco Use  . Smoking status: Never Smoker  . Smokeless tobacco: Never Used  Vaping Use  . Vaping Use: Never used  Substance and Sexual Activity  . Alcohol use: No  . Drug use: No  . Sexual activity: Not on file  Other Topics Concern  . Not on file  Social History Narrative   Lives at [facility] at this time.  Daughter Erskine Squibb and Victorino Dike.    Right-handed.   No caffeine use.   Social Determinants of Health   Financial Resource Strain:   . Difficulty of Paying Living Expenses:   Food Insecurity:   . Worried About Programme researcher, broadcasting/film/video in the Last Year:   . Barista in the Last Year:   Transportation Needs:   . Freight forwarder (Medical):   Marland Kitchen Lack of Transportation (Non-Medical):   Physical Activity:   . Days of Exercise per Week:   . Minutes of Exercise per Session:   Stress:   .  Feeling of Stress :   Social Connections:   . Frequency of Communication with Friends and Family:   . Frequency of Social Gatherings with Friends and Family:   . Attends Religious Services:   . Active Member of Clubs or Organizations:   . Attends BankerClub or Organization Meetings:   Marland Kitchen. Marital Status:   Intimate Partner Violence:   . Fear of Current or Ex-Partner:   . Emotionally Abused:   Marland Kitchen. Physically Abused:   . Sexually Abused:       PHYSICAL EXAM  Vitals:   07/14/20 1543 07/14/20 1544  BP: (!) 171/71 (!) 156/80  Pulse: 71 69  Weight: 129 lb (58.5 kg)   Height: 5\' 3"  (1.6 m)    Body mass index is 22.85 kg/m.  General: well developed, well nourished, pleasant elderly Caucasian female, seated, in no evident distress Head: head normocephalic and atraumatic.   Neck: supple with no carotid or supraclavicular bruits Cardiovascular: regular rate and rhythm, no murmurs Musculoskeletal: no deformity; LUE deformity distal Skin:  no rash/petichiae Vascular:  Normal pulses all extremities   Neurologic Exam Mental Status: Awake and fully alert.  Oriented to place and time. Recent and remote memory intact. Attention span, concentration and fund of knowledge appropriate. Mood and affect appropriate.  Cranial Nerves: Pupils equal, briskly reactive to light. Extraocular movements full without nystagmus. Visual fields full to confrontation. Hearing intact. Facial sensation intact. Face, tongue, palate moves normally and symmetrically.  Motor: Normal bulk and tone. Normal strength in all tested extremity muscles. Sensory.: intact to touch , pinprick , position and vibratory sensation.  Coordination: Rapid alternating movements normal in all extremities. Finger-to-nose and heel-to-shin performed accurately bilaterally. Gait and Station: Arises from chair without difficulty. Stance is normal. Gait demonstrates normal stride length and mild imbalance with difficulty performing tandem walk and walking on heels but is able to ambulate on toes without great difficulty Reflexes: 1+ and symmetric. Toes downgoing.      DIAGNOSTIC DATA (LABS, IMAGING, TESTING) - I reviewed patient records, labs, notes, testing and imaging myself where available.  Lab Results  Component Value Date   WBC 11.5 (H) 09/10/2019   HGB 11.9 (L) 09/10/2019   HCT 37.1 09/10/2019   MCV 87.5 09/10/2019   PLT 253 09/10/2019      Component Value Date/Time   NA 137 09/10/2019 0549   NA 141 02/06/2017 1616   K 3.8 09/10/2019 0549   CL 102 09/10/2019 0549   CO2 26 09/10/2019 0549   GLUCOSE 133 (H) 09/10/2019 0549   BUN 17 09/10/2019 0549   BUN 13 02/06/2017 1616   CREATININE 0.84 09/10/2019 0549   CALCIUM 9.1 09/10/2019 0549   PROT 5.9 (L) 05/01/2017 0715   PROT 6.7 02/06/2017 1616   ALBUMIN 4.0 05/12/2017 1053   AST 23 05/01/2017 0715   ALT 19 05/01/2017 0715   ALKPHOS 63 05/01/2017 0715   BILITOT 0.5 05/01/2017 0715   BILITOT <0.2 02/06/2017 1616   GFRNONAA >60 09/10/2019 0549   GFRAA >60 09/10/2019 0549   Lab Results  Component Value Date   VITAMINB12 504  08/31/2017   Lab Results  Component Value Date   TSH 2.640 08/31/2017      ASSESSMENT AND PLAN 79 y.o. year old female  has a past medical history of Allergic rhinitis, Anxiety, Arthritis, Congenital defect, Depression, GERD (gastroesophageal reflux disease), Headache, Hyperlipemia, Hypertension, IBS (irritable bowel syndrome), Insomnia, Memory loss, Vitamin D deficiency, and Wears glasses. here with:  1.  Neck  pain 2.  Gait abnormality 3.  Memory loss 3.  Anxiety/depression  Continuation of Flexeril 5 mg twice daily and Cymbalta 60 mg daily for ongoing benefit of neck pain -refills provided Referral placed to behavioral health for ongoing depression/anxiety monitoring management Gait has been overall stable with occasional falls without major injury.  Discussed importance of fall prevention and to call office if interested in additional therapy sessions   Follow-up in 1 year or call earlier if needed  I spent 20 minutes of face-to-face and non-face-to-face time with patient.  This included previsit chart review, lab review, study review, order entry, electronic health record documentation, patient education regarding neck pain with ongoing medication use, gait abnormality, anxiety/depression, cognitive impairment and answered all questions to patient satisfaction    Ihor Austin, AGNP-BC  [facility] [address] [place], [place] [address]  Phone [phone] Fax [phone] Note: This document was prepared with digital dictation and possible smart phrase technology. Any transcriptional errors that result from this process are unintentional.

## 2020-07-14 NOTE — Patient Instructions (Signed)
Continue Flexeril and Cymbalta for neck pain management  Referral placed for behavioral health for depression/anxiety management     Followup in the future with me in 1 year or call earlier if needed       Thank you for coming to see [place] at [facility]. I hope we have been able to provide you high quality care today.  You may receive a patient satisfaction survey over the next few weeks. We would appreciate your feedback and comments so that we may continue to improve ourselves and the health of our patients.

## 2020-08-20 DIAGNOSIS — Z23 Encounter for immunization: Secondary | ICD-10-CM | POA: Diagnosis not present

## 2020-08-20 DIAGNOSIS — I10 Essential (primary) hypertension: Secondary | ICD-10-CM | POA: Diagnosis not present

## 2020-08-20 DIAGNOSIS — F419 Anxiety disorder, unspecified: Secondary | ICD-10-CM | POA: Diagnosis not present

## 2020-08-20 DIAGNOSIS — F341 Dysthymic disorder: Secondary | ICD-10-CM | POA: Diagnosis not present

## 2020-10-13 NOTE — Progress Notes (Signed)
I have reviewed and agreed above plan. 

## 2020-10-21 DIAGNOSIS — Z23 Encounter for immunization: Secondary | ICD-10-CM | POA: Diagnosis not present

## 2021-02-17 DIAGNOSIS — Z1211 Encounter for screening for malignant neoplasm of colon: Secondary | ICD-10-CM | POA: Diagnosis not present

## 2021-02-17 DIAGNOSIS — G5601 Carpal tunnel syndrome, right upper limb: Secondary | ICD-10-CM | POA: Diagnosis not present

## 2021-02-17 DIAGNOSIS — R1310 Dysphagia, unspecified: Secondary | ICD-10-CM | POA: Diagnosis not present

## 2021-02-17 DIAGNOSIS — F341 Dysthymic disorder: Secondary | ICD-10-CM | POA: Diagnosis not present

## 2021-02-17 DIAGNOSIS — Z0000 Encounter for general adult medical examination without abnormal findings: Secondary | ICD-10-CM | POA: Diagnosis not present

## 2021-02-17 DIAGNOSIS — Q7132 Congenital absence of left hand and finger: Secondary | ICD-10-CM | POA: Diagnosis not present

## 2021-02-17 DIAGNOSIS — I10 Essential (primary) hypertension: Secondary | ICD-10-CM | POA: Diagnosis not present

## 2021-02-17 DIAGNOSIS — F419 Anxiety disorder, unspecified: Secondary | ICD-10-CM | POA: Diagnosis not present

## 2021-03-31 DIAGNOSIS — Z1211 Encounter for screening for malignant neoplasm of colon: Secondary | ICD-10-CM | POA: Diagnosis not present

## 2021-04-02 DIAGNOSIS — M1811 Unilateral primary osteoarthritis of first carpometacarpal joint, right hand: Secondary | ICD-10-CM | POA: Diagnosis not present

## 2021-04-02 DIAGNOSIS — M79641 Pain in right hand: Secondary | ICD-10-CM | POA: Diagnosis not present

## 2021-04-15 ENCOUNTER — Emergency Department (HOSPITAL_BASED_OUTPATIENT_CLINIC_OR_DEPARTMENT_OTHER)
Admission: EM | Admit: 2021-04-15 | Discharge: 2021-04-15 | Disposition: A | Discharge status: Home / Self Care | Payer: Medicare Other | Attending: Emergency Medicine | Admitting: Emergency Medicine

## 2021-04-15 ENCOUNTER — Other Ambulatory Visit: Payer: Self-pay

## 2021-04-15 ENCOUNTER — Encounter (HOSPITAL_BASED_OUTPATIENT_CLINIC_OR_DEPARTMENT_OTHER): Payer: Self-pay | Admitting: *Deleted

## 2021-04-15 DIAGNOSIS — Z79899 Other long term (current) drug therapy: Secondary | ICD-10-CM | POA: Diagnosis not present

## 2021-04-15 DIAGNOSIS — R21 Rash and other nonspecific skin eruption: Secondary | ICD-10-CM | POA: Diagnosis not present

## 2021-04-15 DIAGNOSIS — I10 Essential (primary) hypertension: Secondary | ICD-10-CM | POA: Insufficient documentation

## 2021-04-15 DIAGNOSIS — Z96611 Presence of right artificial shoulder joint: Secondary | ICD-10-CM | POA: Insufficient documentation

## 2021-04-15 DIAGNOSIS — L299 Pruritus, unspecified: Secondary | ICD-10-CM | POA: Insufficient documentation

## 2021-04-15 MED ORDER — HYDROXYZINE HCL 25 MG PO TABS: 25.0000 mg | ORAL_TABLET | Freq: Three times a day (TID) | ORAL | 0 refills | Status: DC | PRN

## 2021-04-15 MED ORDER — HYDROCORTISONE 2.5 % EX LOTN: TOPICAL_LOTION | Freq: Two times a day (BID) | CUTANEOUS | 0 refills | Status: AC

## 2021-04-15 MED ORDER — CALAMINE EX LOTN: 1.0000 "application " | TOPICAL_LOTION | Freq: Every day | CUTANEOUS | 0 refills | Status: AC | PRN

## 2021-04-15 MED ORDER — DIPHENHYDRAMINE HCL 25 MG PO TABS: 25.0000 mg | ORAL_TABLET | Freq: Every evening | ORAL | 0 refills | Status: DC | PRN

## 2021-04-15 NOTE — ED Triage Notes (Signed)
Rashes to left arm, under her left breast and some on her abdomen.  Pt stated that it itches.  Patient was cleaning her backyard 2-3 days ago.

## 2021-04-15 NOTE — ED Provider Notes (Signed)
[facility] EMERGENCY DEPT Provider Note   CSN: [id number] Arrival date & time: 04/15/21  1707     History Chief Complaint  Patient presents with  . Rash    Elizabeth [name] is a 80 y.o. female presenting to the emergency department with pruritic rash.  She reports she was working in the yard a week ago.  That same day she began having an itching rash and spots on her right arm.  She says it is spread to most of her left abdomen and under her left breast.  It is extremely itchy.  She thinks he may been exposed to poison oak or poison ivy.  She has washed all her clothes since then.  She has not taken any medications aside from applying topical antifungal medication her left breast, which sometimes gets yeast infections.  She has not used any other new medications.  No other sick contacts in the house.  No one else with similar lesions in household.  HPI     Past Medical History:  Diagnosis Date  . Allergic rhinitis   . Anxiety   . Arthritis    right shoulder  . Congenital defect    left arm  . Depression   . GERD (gastroesophageal reflux disease)   . Headache   . Hyperlipemia   . Hypertension   . IBS (irritable bowel syndrome)   . Insomnia   . Memory loss   . Vitamin D deficiency   . Wears glasses     Patient Active Problem List   Diagnosis Date Noted  . S/p reverse total shoulder arthroplasty 09/09/2019  . Neck pain 08/31/2017  . Encephalopathy 05/11/2017  . Orthostatic hypotension 05/01/2017  . Generalized weakness 04/30/2017  . Hypertension 04/30/2017  . Depression 04/30/2017  . Right arm pain 04/30/2017  . Anxiety 04/30/2017  . Passed out 02/06/2017  . Memory loss 02/06/2017    Past Surgical History:  Procedure Laterality Date  . ABDOMINAL HYSTERECTOMY    . CATARACT EXTRACTION Bilateral   . NECK SURGERY    . REVERSE SHOULDER ARTHROPLASTY Right 09/09/2019   Procedure: REVERSE SHOULDER ARTHROPLASTY;  Surgeon: Ernest Mallick, MD;   [facility];  Service: Orthopedics;  Laterality: Right;      OB History    Gravida  1   Para  1   Term      Preterm      AB      Living        SAB      IAB      Ectopic      Multiple      Live Births              Family History  Adopted: Yes  Problem Relation Age of Onset  . Breast cancer Mother   . Other Father        unsure of history    Social History   Tobacco Use  . Smoking status: Never Smoker  . Smokeless tobacco: Never Used  Vaping Use  . Vaping Use: Never used  Substance Use Topics  . Alcohol use: No  . Drug use: No    Home Medications Prior to Admission medications   Medication Sig Start Date End Date Taking? Authorizing Provider  calamine lotion Apply 1 application topically daily as needed for up to 10 days for itching. For itching 04/15/21 04/25/21 Yes Abdi Husak, Kermit Balo, MD  diphenhydrAMINE (BENADRYL) 25 MG tablet Take 1 tablet (25  mg total) by mouth at bedtime as needed for up to 10 doses for itching. 04/15/21  Yes Terald Sleeper, MD  diphenhydramine-acetaminophen (TYLENOL PM) 25-500 MG TABS tablet Take 3 tablets by mouth at bedtime.   Yes [provider]  DULoxetine (CYMBALTA) 60 MG capsule Take 1 capsule (60 mg total) by mouth daily. 07/14/20  Yes McCue, Shanda Bumps, NP  hydrocortisone 2.5 % lotion Apply topically 2 (two) times daily for 7 days. Do not use on face 04/15/21 04/22/21 Yes Clarita Mcelvain, Kermit Balo, MD  hydrOXYzine (ATARAX/VISTARIL) 25 MG tablet Take 1 tablet (25 mg total) by mouth every 8 (eight) hours as needed for up to 18 doses for itching. 04/15/21  Yes Terald Sleeper, MD  metoprolol succinate (TOPROL-XL) 50 MG 24 hr tablet Take 75 mg by mouth 2 (two) times daily. Take with or immediately following a meal.  "takes 1.5 of the pills."   Yes [provider]    Allergies    Norvasc [amlodipine], Benicar [olmesartan], and Lisinopril  Review of Systems   Review of Systems  Constitutional: Negative for chills  and fever.  HENT: Negative for ear pain.   Respiratory: Negative for cough and shortness of breath.   Cardiovascular: Negative for chest pain and palpitations.  Gastrointestinal: Negative for abdominal pain and vomiting.  Musculoskeletal: Negative for arthralgias and back pain.  Skin: Positive for rash and wound.  All other systems reviewed and are negative.   Physical Exam Updated Vital Signs BP (!) 158/84 (BP Location: Left Arm)   Pulse 68   Temp 97.6 F (36.4 C) (Oral)   Resp 16   Ht 5\' 3"  (1.6 m)   Wt 63.5 kg   SpO2 100%   BMI 24.80 kg/m   Physical Exam Constitutional:      General: She is not in acute distress. HENT:     Head: Normocephalic and atraumatic.  Eyes:     Conjunctiva/sclera: Conjunctivae normal.     Pupils: Pupils are equal, round, and reactive to light.  Cardiovascular:     Rate and Rhythm: Normal rate and regular rhythm.  Pulmonary:     Effort: Pulmonary effort is normal. No respiratory distress.  Abdominal:     General: There is no distension.     Tenderness: There is no abdominal tenderness.  Skin:    General: Skin is warm and dry.     Comments: Pruritic rash with small scattered wheals involving the right arm and under the left breast.  No overlying erythema or excoriation marks to suggest cellulitis.  No evidence of fungal infection under the left breast.  Neurological:     General: No focal deficit present.     Mental Status: She is alert. Mental status is at baseline.     ED Results / Procedures / Treatments   Labs (all labs ordered are listed, but only abnormal results are displayed) Labs Reviewed - No data to display  EKG None  Radiology No results found.  Procedures Procedures   Medications Ordered in ED Medications - No data to display  ED Course  I have reviewed the triage vital signs and the nursing notes.  Pertinent labs & imaging results that were available during my care of the patient were reviewed by me and  considered in my medical decision making (see chart for details).  Patient is here with pruritic rash in the setting working outdoors, very likely poison oak or poison ivy exposure.  She has since washed all of her close.  We will prescribed a topical cream as well as Benadryl and Vistaril to help with itching, and I advised her to follow-up with her PCP in 1 week.  I have lower suspicion for allergic reaction or scabies.  Do not suspect fungal infection.     Final Clinical Impression(s) / ED Diagnoses Final diagnoses:  Rash    Rx / DC Orders ED Discharge Orders         Ordered    hydrocortisone 2.5 % lotion  2 times daily        04/15/21 1953    calamine lotion  Daily PRN        04/15/21 1953    hydrOXYzine (ATARAX/VISTARIL) 25 MG tablet  Every 8 hours PRN        04/15/21 1953    diphenhydrAMINE (BENADRYL) 25 MG tablet  At bedtime PRN        04/15/21 1953           Terald Sleeper, MD 04/16/21 [phone]

## 2021-04-15 NOTE — Discharge Instructions (Addendum)
The rash may be from poison oak or poison ivy.  This should get better on average in 1 to 3 weeks after your exposure.  You should put the steroid cream on the rash on your arms and belly twice a day. (Do not use this cream on your face around your eyes).  You can also buy and use calamine lotion which is a soothing lotion and may help with the itching.  In the daytime you can take Vistaril for itching.  At night can take Benadryl before bedtime for itching to help you sleep.  Please call and follow-up with your primary care doctor in 1 week.

## 2021-06-30 DIAGNOSIS — R410 Disorientation, unspecified: Secondary | ICD-10-CM | POA: Diagnosis not present

## 2021-07-09 ENCOUNTER — Other Ambulatory Visit: Payer: Self-pay | Admitting: Adult Health

## 2021-07-14 ENCOUNTER — Ambulatory Visit: Payer: Medicare Other | Admitting: Adult Health

## 2021-07-20 ENCOUNTER — Telehealth: Payer: Self-pay | Admitting: Adult Health

## 2021-07-20 MED ORDER — DULOXETINE HCL 60 MG PO CPEP: 60.0000 mg | ORAL_CAPSULE | Freq: Every day | ORAL | 3 refills | Status: DC

## 2021-07-20 MED ORDER — CYCLOBENZAPRINE HCL 5 MG PO TABS
5.0000 mg | ORAL_TABLET | Freq: Three times a day (TID) | ORAL | 1 refills | Status: DC | PRN
Start: 2021-07-20 — End: 2022-01-04

## 2021-07-20 NOTE — Telephone Encounter (Signed)
Noted that RN had discontinued cyclobenzaprine when patient went to ED 04/15/21. Called patient to discuss. She states she never reported any side effects, didn't know why the RN discontinued it. She still needs refill. I advised we can refill both meds. She confirmed her FU,   verbalized understanding, appreciation. Refills sent.

## 2021-07-20 NOTE — Telephone Encounter (Signed)
Pt request refill Cyclobenzaprine HCl 5 MG and DULoxetine (CYMBALTA) 60 MG capsule at [id number]

## 2021-08-25 ENCOUNTER — Ambulatory Visit: Payer: Medicare Other | Admitting: Adult Health

## 2021-08-27 DIAGNOSIS — M79602 Pain in left arm: Secondary | ICD-10-CM | POA: Diagnosis not present

## 2021-08-27 DIAGNOSIS — Z23 Encounter for immunization: Secondary | ICD-10-CM | POA: Diagnosis not present

## 2021-08-27 DIAGNOSIS — F419 Anxiety disorder, unspecified: Secondary | ICD-10-CM | POA: Diagnosis not present

## 2021-08-27 DIAGNOSIS — F341 Dysthymic disorder: Secondary | ICD-10-CM | POA: Diagnosis not present

## 2021-08-27 DIAGNOSIS — I10 Essential (primary) hypertension: Secondary | ICD-10-CM | POA: Diagnosis not present

## 2021-09-27 DIAGNOSIS — Z23 Encounter for immunization: Secondary | ICD-10-CM | POA: Diagnosis not present

## 2021-10-01 ENCOUNTER — Other Ambulatory Visit: Payer: Self-pay | Admitting: Family Medicine

## 2021-10-01 DIAGNOSIS — Z1231 Encounter for screening mammogram for malignant neoplasm of breast: Secondary | ICD-10-CM

## 2021-10-27 ENCOUNTER — Other Ambulatory Visit: Payer: Self-pay

## 2021-10-27 ENCOUNTER — Emergency Department (HOSPITAL_BASED_OUTPATIENT_CLINIC_OR_DEPARTMENT_OTHER)
Admission: EM | Admit: 2021-10-27 | Discharge: 2021-10-27 | Disposition: A | Discharge status: Home / Self Care | Payer: Medicare Other | Attending: Emergency Medicine | Admitting: Emergency Medicine

## 2021-10-27 ENCOUNTER — Emergency Department (HOSPITAL_BASED_OUTPATIENT_CLINIC_OR_DEPARTMENT_OTHER): Payer: Medicare Other | Admitting: Radiology

## 2021-10-27 ENCOUNTER — Encounter (HOSPITAL_BASED_OUTPATIENT_CLINIC_OR_DEPARTMENT_OTHER): Payer: Self-pay | Admitting: Emergency Medicine

## 2021-10-27 DIAGNOSIS — Z79899 Other long term (current) drug therapy: Secondary | ICD-10-CM | POA: Insufficient documentation

## 2021-10-27 DIAGNOSIS — F4321 Adjustment disorder with depressed mood: Secondary | ICD-10-CM

## 2021-10-27 DIAGNOSIS — R002 Palpitations: Secondary | ICD-10-CM | POA: Insufficient documentation

## 2021-10-27 DIAGNOSIS — F4320 Adjustment disorder, unspecified: Secondary | ICD-10-CM | POA: Diagnosis not present

## 2021-10-27 DIAGNOSIS — I10 Essential (primary) hypertension: Secondary | ICD-10-CM | POA: Diagnosis not present

## 2021-10-27 DIAGNOSIS — R0602 Shortness of breath: Secondary | ICD-10-CM | POA: Diagnosis not present

## 2021-10-27 LAB — CBC
HCT: 42.4 % (ref 36.0–46.0)
Hemoglobin: 13.5 g/dL (ref 12.0–15.0)
MCH: 26.9 pg (ref 26.0–34.0)
MCHC: 31.8 g/dL (ref 30.0–36.0)
MCV: 84.6 fL (ref 80.0–100.0)
Platelets: 263 10*3/uL (ref 150–400)
RBC: 5.01 MIL/uL (ref 3.87–5.11)
RDW: 14.9 % (ref 11.5–15.5)
WBC: 6.1 10*3/uL (ref 4.0–10.5)
nRBC: 0 % (ref 0.0–0.2)

## 2021-10-27 LAB — BASIC METABOLIC PANEL
Anion gap: 8 (ref 5–15)
BUN: 11 mg/dL (ref 8–23)
CO2: 27 mmol/L (ref 22–32)
Calcium: 9.4 mg/dL (ref 8.9–10.3)
Chloride: 105 mmol/L (ref 98–111)
Creatinine, Ser: 0.8 mg/dL (ref 0.44–1.00)
GFR, Estimated: >60 mL/min (ref >60)
Glucose, Bld: 124 mg/dL — ABNORMAL HIGH (ref 70–99)
Potassium: 3.8 mmol/L (ref 3.5–5.1)
Sodium: 140 mmol/L (ref 135–145)

## 2021-10-27 NOTE — Discharge Instructions (Signed)
Your work-up here today was negative.  Follow-up with your doctor as needed

## 2021-10-27 NOTE — ED Provider Notes (Signed)
[facility] EMERGENCY DEPT Provider Note   CSN: [id number] Arrival date & time: 10/27/21  1240     History Chief Complaint  Patient presents with   Shortness of Breath    Elizabeth [name] is a 80 y.o. female.  80 year old female presents with palpitations.  Symptoms began after the death of her pet.  States that when she looks areas in her house where where the animal was she becomes desponded.  Denies any SI or HI.  No anginal or CHF type symptoms.  No treatment use prior to arrival      Past Medical History:  Diagnosis Date   Allergic rhinitis    Anxiety    Arthritis    right shoulder   Congenital defect    left arm   Depression    GERD (gastroesophageal reflux disease)    Headache    Hyperlipemia    Hypertension    IBS (irritable bowel syndrome)    Insomnia    Memory loss    Vitamin D deficiency    Wears glasses     Patient Active Problem List   Diagnosis Date Noted   S/p reverse total shoulder arthroplasty 09/09/2019   Neck pain 08/31/2017   Encephalopathy 05/11/2017   Orthostatic hypotension 05/01/2017   Generalized weakness 04/30/2017   Hypertension 04/30/2017   Depression 04/30/2017   Right arm pain 04/30/2017   Anxiety 04/30/2017   Passed out 02/06/2017   Memory loss 02/06/2017    Past Surgical History:  Procedure Laterality Date   ABDOMINAL HYSTERECTOMY     CATARACT EXTRACTION Bilateral    NECK SURGERY     REVERSE SHOULDER ARTHROPLASTY Right 09/09/2019   Procedure: REVERSE SHOULDER ARTHROPLASTY;  Surgeon: Ernest Mallick, MD;  Location: [facility] OR;  Service: Orthopedics;  Laterality: Right;      OB History     Gravida  1   Para  1   Term      Preterm      AB      Living         SAB      IAB      Ectopic      Multiple      Live Births              Family History  Adopted: Yes  Problem Relation Age of Onset   Breast cancer Mother    Other Father        unsure of history    Social  History   Tobacco Use   Smoking status: Never   Smokeless tobacco: Never  Vaping Use   Vaping Use: Never used  Substance Use Topics   Alcohol use: No   Drug use: No    Home Medications Prior to Admission medications   Medication Sig Start Date End Date Taking? Authorizing Provider  cyclobenzaprine (FLEXERIL) 5 MG tablet Take 1 tablet (5 mg total) by mouth every 8 (eight) hours as needed for muscle spasms. 07/20/21   Ihor Austin, NP  diphenhydrAMINE (BENADRYL) 25 MG tablet Take 1 tablet (25 mg total) by mouth at bedtime as needed for up to 10 doses for itching. 04/15/21   Terald Sleeper, MD  diphenhydramine-acetaminophen (TYLENOL PM) 25-500 MG TABS tablet Take 3 tablets by mouth at bedtime.    [provider]  DULoxetine (CYMBALTA) 60 MG capsule Take 1 capsule (60 mg total) by mouth daily. 07/20/21   Ihor Austin, NP  hydrOXYzine (ATARAX/VISTARIL) 25 MG tablet  Take 1 tablet (25 mg total) by mouth every 8 (eight) hours as needed for up to 18 doses for itching. 04/15/21   Terald Sleeper, MD  metoprolol succinate (TOPROL-XL) 50 MG 24 hr tablet Take 75 mg by mouth 2 (two) times daily. Take with or immediately following a meal.  "takes 1.5 of the pills."    [provider]    Allergies    Norvasc [amlodipine], Benicar [olmesartan], and Lisinopril  Review of Systems   Review of Systems  All other systems reviewed and are negative.  Physical Exam Updated Vital Signs BP (!) 183/97 (BP Location: Right Arm)   Pulse 77   Temp 98.1 F (36.7 C)   Resp 16   Ht 1.6 m (5\' 3" )   Wt 59 kg   SpO2 99%   BMI 23.03 kg/m   Physical Exam Vitals and nursing note reviewed.  Constitutional:      General: She is not in acute distress.    Appearance: Normal appearance. She is well-developed. She is not toxic-appearing.  HENT:     Head: Normocephalic and atraumatic.  Eyes:     General: Lids are normal.     Conjunctiva/sclera: Conjunctivae normal.     Pupils: Pupils are  equal, round, and reactive to light.  Neck:     Thyroid: No thyroid mass.     Trachea: No tracheal deviation.  Cardiovascular:     Rate and Rhythm: Normal rate and regular rhythm.     Heart sounds: Normal heart sounds. No murmur heard.   No gallop.  Pulmonary:     Effort: Pulmonary effort is normal. No respiratory distress.     Breath sounds: Normal breath sounds. No stridor. No decreased breath sounds, wheezing, rhonchi or rales.  Abdominal:     General: There is no distension.     Palpations: Abdomen is soft.     Tenderness: There is no abdominal tenderness. There is no rebound.  Musculoskeletal:        General: No tenderness. Normal range of motion.     Cervical back: Normal range of motion and neck supple.  Skin:    General: Skin is warm and dry.     Findings: No abrasion or rash.  Neurological:     Mental Status: She is alert and oriented to person, place, and time. Mental status is at baseline.     GCS: GCS eye subscore is 4. GCS verbal subscore is 5. GCS motor subscore is 6.     Cranial Nerves: No cranial nerve deficit.     Sensory: No sensory deficit.     Motor: Motor function is intact.  Psychiatric:        Attention and Perception: Attention normal.        Mood and Affect: Mood is depressed.        Speech: Speech normal.        Behavior: Behavior is withdrawn.    ED Results / Procedures / Treatments   Labs (all labs ordered are listed, but only abnormal results are displayed) Labs Reviewed  CBC  BASIC METABOLIC PANEL    EKG EKG Interpretation  Date/Time:  Wednesday October 27 2021 12:50:56 EST Ventricular Rate:  75 PR Interval:  126 QRS Duration: 74 QT Interval:  378 QTC Calculation: 422 R Axis:   6 Text Interpretation: Normal sinus rhythm Cannot rule out Anterior infarct , age undetermined Abnormal ECG No significant change since last tracing Confirmed by 08-06-2005 (Lorre Nick) on 10/27/2021 12:57:30  PM  Radiology No results  found.  Procedures Procedures   Medications Ordered in ED Medications - No data to display  ED Course  I have reviewed the triage vital signs and the nursing notes.  Pertinent labs & imaging results that were available during my care of the patient were reviewed by me and considered in my medical decision making (see chart for details).    MDM Rules/Calculators/A&P                           Patient work appears reassuring.  Suspect grief reaction.  Stable for discharge Final Clinical Impression(s) / ED Diagnoses Final diagnoses:  None    Rx / DC Orders ED Discharge Orders     None        Lorre Nick, MD 10/27/21 1411

## 2021-10-27 NOTE — ED Triage Notes (Addendum)
Pt presents to ED POV. Pt c/o SOB and racing heart since waking up this morning. Pt denies sick contacts or any cardiac hx. Reports increased stress at home

## 2021-10-29 ENCOUNTER — Ambulatory Visit
Admission: RE | Admit: 2021-10-29 | Discharge: 2021-10-29 | Disposition: A | Discharge status: Home / Self Care | Payer: Medicare Other | Source: Ambulatory Visit | Attending: Family Medicine | Admitting: Family Medicine

## 2021-10-29 ENCOUNTER — Other Ambulatory Visit: Payer: Self-pay

## 2021-10-29 DIAGNOSIS — Z1231 Encounter for screening mammogram for malignant neoplasm of breast: Secondary | ICD-10-CM | POA: Diagnosis not present

## 2021-11-10 ENCOUNTER — Ambulatory Visit: Payer: Medicare Other | Admitting: Adult Health

## 2021-11-10 NOTE — Progress Notes (Deleted)
PATIENT: Elizabeth [name] DOB: 10/09/1941    [facility] provider: Dr. Terrace Arabia REASON FOR VISIT: follow up neck pain HISTORY FROM: patient  No chief complaint on file.    HPI:  Today,07/14/2020, Ms. Diveley returns for 49-month follow-up regarding neck pain/occipital headache, chronic gait instability and mild cognitive impairment.   Chronic gait abnormality: Gradual onset since 2013.  3 sessions with PT with last session 02/06/2020.  She has had a couple falls since prior visit with minor skin abrasion but otherwise no other injury.  She denies losing balance or legs giving out but more due to tripping or not lifting her legs high enough  Neck pain/occipital headache: Stable.  Ongoing benefit of medication.  Continues on Cymbalta and Flexeril without side effects  Mild cognitive impairment stable without worsening.  Does report continued depression and anxiety and is interested in referral to behavioral health     History provided for reference purposes only Update 01/07/2020 JM Ms. Devino is a 80 year old female who is being seen today for 43-month follow-up with prior visit 06/2019.  Ongoing use of Cymbalta and and flexeril with ongoing benefit regarding neck pain and anxiety.  She did undergo right reverse total shoulder arthroplasty on 09/09/2019 due to osteoarthritis tolerated well without complication.  She does endorse increased falls since the holidays and is unsure if this is due to tripping or loss of balance.  Falls typically occur when ambulating on uneven surfaces or walking her dog.  She does have left orbital ecchymosis from prior fall.  Denies loss of consciousness or any other injuries.  She is interested in participating in outpatient PT for possible deconditioning.  At prior visit, she was referred to behavioral health but she did not make initial evaluation as she was not interested in virtual visits.  She does states she will participate when she is able to be seen in the  office.  Memory has been stable.  No further concerns at this time.   History: Ms. Gorelick is a 80 year old female with PMH  hypertension, orthostatic hypotension, depression anxiety, chronic insomnia, hyperlipidemia, congenital left arm defect, vitamin D deficiency, cervical decompression more than 30 years ago with benefit.  Initially evaluated by Dr. Terrace Arabia on 02/06/2017 after PCP referred to our office for evaluation of memory loss and head injury with ongoing complaints of memory disturbance, altered mental status, generalized weakness and speech difficulty.  In 11/2016, had a fall resulting in abrasion and transient loss of consciousness but no further injuries. She reported gradual onset gait abnormality since 2013, fell often, legs give out underneath her often, right since to be more frequent, she denies bilateral feet paresthesia, denies bowel and bladder incontinence.  MRI brain 02/2017-generalized atrophy supratentorium small vessel disease EEG 05/2017 mild generalized slowing during admission acute encephalopathy EMG/NCV 07/2017 moderate right carpal tunnel syndrome MRI cervical spine 11/2016 no evidence of nerve root or spinal cord compression with only mild to moderate DJD CT head 01/2019 traumatic right frontal lobe parenchymal hemorrhage post fall       REVIEW OF SYSTEMS: Out of a complete 14 system review of symptoms, the patient complains only of the following symptoms, and all other reviewed systems are negative. Depression, anxiety, gait impairment and neck pain   ALLERGIES: Allergies  Allergen Reactions   Norvasc [Amlodipine] Swelling    Pt not aware of    Benicar [Olmesartan] Other (See Comments)    Nervousness Pt not aware of    Lisinopril Cough  Pt not aware of     HOME MEDICATIONS: Outpatient Medications Prior to Visit  Medication Sig Dispense Refill   cyclobenzaprine (FLEXERIL) 5 MG tablet Take 1 tablet (5 mg total) by mouth every 8 (eight) hours as needed for  muscle spasms. 180 tablet 1   diphenhydrAMINE (BENADRYL) 25 MG tablet Take 1 tablet (25 mg total) by mouth at bedtime as needed for up to 10 doses for itching. 10 tablet 0   diphenhydramine-acetaminophen (TYLENOL PM) 25-500 MG TABS tablet Take 3 tablets by mouth at bedtime.     DULoxetine (CYMBALTA) 60 MG capsule Take 1 capsule (60 mg total) by mouth daily. 90 capsule 3   hydrOXYzine (ATARAX/VISTARIL) 25 MG tablet Take 1 tablet (25 mg total) by mouth every 8 (eight) hours as needed for up to 18 doses for itching. (Patient not taking: Reported on 10/27/2021) 18 tablet 0   metoprolol succinate (TOPROL-XL) 50 MG 24 hr tablet Take 75 mg by mouth 2 (two) times daily. Take with or immediately following a meal.  "takes 1.5 of the pills."     No facility-administered medications prior to visit.    PAST MEDICAL HISTORY: Past Medical History:  Diagnosis Date   Allergic rhinitis    Anxiety    Arthritis    right shoulder   Congenital defect    left arm   Depression    GERD (gastroesophageal reflux disease)    Headache    Hyperlipemia    Hypertension    IBS (irritable bowel syndrome)    Insomnia    Memory loss    Vitamin D deficiency    Wears glasses     PAST SURGICAL HISTORY: Past Surgical History:  Procedure Laterality Date   ABDOMINAL HYSTERECTOMY     CATARACT EXTRACTION Bilateral    NECK SURGERY     REVERSE SHOULDER ARTHROPLASTY Right 09/09/2019   Procedure: REVERSE SHOULDER ARTHROPLASTY;  Surgeon: Ernest Mallick, MD;  Location: [facility] OR;  Service: Orthopedics;  Laterality: Right;     FAMILY HISTORY: Family History  Adopted: Yes  Problem Relation Age of Onset   Breast cancer Mother    Other Father        unsure of history    SOCIAL HISTORY: Social History   Socioeconomic History   Marital status: Widowed    Spouse name: Not on file   Number of children: 1   Years of education: HS   Highest education level: Not on file  Occupational History   Occupation:  Retired  Tobacco Use   Smoking status: Never   Smokeless tobacco: Never  Vaping Use   Vaping Use: Never used  Substance and Sexual Activity   Alcohol use: No   Drug use: No   Sexual activity: Not on file  Other Topics Concern   Not on file  Social History Narrative   Lives at [facility] at this time.  Daughter Erskine Squibb and Victorino Dike.    Right-handed.   No caffeine use.   Social Determinants of Health   Financial Resource Strain: Not on file  Food Insecurity: Not on file  Transportation Needs: Not on file  Physical Activity: Not on file  Stress: Not on file  Social Connections: Not on file  Intimate Partner Violence: Not on file      PHYSICAL EXAM  There were no vitals filed for this visit.  There is no height or weight on file to calculate BMI.  General: well developed, well nourished, pleasant elderly Caucasian  female, seated, in no evident distress Head: head normocephalic and atraumatic.   Neck: supple with no carotid or supraclavicular bruits Cardiovascular: regular rate and rhythm, no murmurs Musculoskeletal: no deformity; LUE deformity distal Skin:  no rash/petichiae Vascular:  Normal pulses all extremities   Neurologic Exam Mental Status: Awake and fully alert. Oriented to place and time. Recent and remote memory intact. Attention span, concentration and fund of knowledge appropriate. Mood and affect appropriate.  Cranial Nerves: Pupils equal, briskly reactive to light. Extraocular movements full without nystagmus. Visual fields full to confrontation. Hearing intact. Facial sensation intact. Face, tongue, palate moves normally and symmetrically.  Motor: Normal bulk and tone. Normal strength in all tested extremity muscles. Sensory.: intact to touch , pinprick , position and vibratory sensation.  Coordination: Rapid alternating movements normal in all extremities. Finger-to-nose and heel-to-shin performed accurately bilaterally. Gait and Station: Arises  from chair without difficulty. Stance is normal. Gait demonstrates normal stride length and mild imbalance with difficulty performing tandem walk and walking on heels but is able to ambulate on toes without great difficulty Reflexes: 1+ and symmetric. Toes downgoing.      DIAGNOSTIC DATA (LABS, IMAGING, TESTING) - I reviewed patient records, labs, notes, testing and imaging myself where available.  Lab Results  Component Value Date   WBC 6.1 10/27/2021   HGB 13.5 10/27/2021   HCT 42.4 10/27/2021   MCV 84.6 10/27/2021   PLT 263 10/27/2021      Component Value Date/Time   NA 140 10/27/2021 1322   NA 141 02/06/2017 1616   K 3.8 10/27/2021 1322   CL 105 10/27/2021 1322   CO2 27 10/27/2021 1322   GLUCOSE 124 (H) 10/27/2021 1322   BUN 11 10/27/2021 1322   BUN 13 02/06/2017 1616   CREATININE 0.80 10/27/2021 1322   CALCIUM 9.4 10/27/2021 1322   PROT 5.9 (L) 05/01/2017 0715   PROT 6.7 02/06/2017 1616   ALBUMIN 4.0 05/12/2017 1053   AST 23 05/01/2017 0715   ALT 19 05/01/2017 0715   ALKPHOS 63 05/01/2017 0715   BILITOT 0.5 05/01/2017 0715   BILITOT <0.2 02/06/2017 1616   GFRNONAA >60 10/27/2021 1322   GFRAA >60 09/10/2019 0549   Lab Results  Component Value Date   VITAMINB12 504 08/31/2017   Lab Results  Component Value Date   TSH 2.640 08/31/2017      ASSESSMENT AND PLAN 80 y.o. year old female  has a past medical history of Allergic rhinitis, Anxiety, Arthritis, Congenital defect, Depression, GERD (gastroesophageal reflux disease), Headache, Hyperlipemia, Hypertension, IBS (irritable bowel syndrome), Insomnia, Memory loss, Vitamin D deficiency, and Wears glasses. here with:  1.  Neck pain 2.  Gait abnormality 3.  Memory loss 3.  Anxiety/depression  Continuation of Flexeril 5 mg twice daily and Cymbalta 60 mg daily for ongoing benefit of neck pain -refills provided Referral placed to behavioral health for ongoing depression/anxiety monitoring management Gait has  been overall stable with occasional falls without major injury.  Discussed importance of fall prevention and to call office if interested in additional therapy sessions   Follow-up in 1 year or call earlier if needed  I spent 20 minutes of face-to-face and non-face-to-face time with patient.  This included previsit chart review, lab review, study review, order entry, electronic health record documentation, patient education regarding neck pain with ongoing medication use, gait abnormality, anxiety/depression, cognitive impairment and answered all questions to patient satisfaction    Ihor Austin, AGNP-BC  [facility] [address]  [place], [place] [address]  Phone [phone] Fax [phone] Note: This document was prepared with digital dictation and possible smart phrase technology. Any transcriptional errors that result from this process are unintentional.

## 2021-12-29 DIAGNOSIS — M25512 Pain in left shoulder: Secondary | ICD-10-CM | POA: Diagnosis not present

## 2022-01-04 ENCOUNTER — Encounter: Payer: Self-pay | Admitting: Adult Health

## 2022-01-04 ENCOUNTER — Ambulatory Visit (INDEPENDENT_AMBULATORY_CARE_PROVIDER_SITE_OTHER): Payer: Medicare Other | Admitting: Adult Health

## 2022-01-04 VITALS — BP 182/88 | HR 81 | Ht 63.0 in | Wt 142.0 lb

## 2022-01-04 DIAGNOSIS — R2681 Unsteadiness on feet: Secondary | ICD-10-CM

## 2022-01-04 DIAGNOSIS — M542 Cervicalgia: Secondary | ICD-10-CM

## 2022-01-04 DIAGNOSIS — F418 Other specified anxiety disorders: Secondary | ICD-10-CM | POA: Diagnosis not present

## 2022-01-04 MED ORDER — CYCLOBENZAPRINE HCL 5 MG PO TABS: 5.0000 mg | ORAL_TABLET | Freq: Three times a day (TID) | ORAL | 1 refills | Status: DC | PRN

## 2022-01-04 MED ORDER — DULOXETINE HCL 60 MG PO CPEP: 60.0000 mg | ORAL_CAPSULE | Freq: Every day | ORAL | 3 refills | Status: DC

## 2022-01-04 NOTE — Patient Instructions (Signed)
Your Plan:  Continue current plan  You should be called by behavioral health for depression and anxiety     Follow up in 1 year or call earlier if needed     Thank you for coming to see [place] at [facility]. I hope we have been able to provide you high quality care today.  You may receive a patient satisfaction survey over the next few weeks. We would appreciate your feedback and comments so that we may continue to improve ourselves and the health of our patients.

## 2022-01-04 NOTE — Progress Notes (Signed)
PATIENT: Elizabeth [name] DOB: 1941/01/27    [facility] provider: Dr. Terrace ArabiaYan REASON FOR VISIT: follow up neck pain HISTORY FROM: patient  Chief Complaint  Patient presents with   Follow-up    RM 2 alone Pt is well, neck pain has been tolerable with meds.      HPI:  Update 01/04/2022 JM: Returns for overdue 1 year follow-up. Stable from neuro standpoint. Neck pain stable on duloxetine and Flexeril, denies side effects. Cognition stable without worsening. Chronic gait impairment stable. Denies any recent falls. Does c/o continued depression - lost her 83 yo dog back in November and still having grief and increased depression for this. She has not yet [facility] - referral placed at prior visit but upon review, referral sent to office not accepting patients. She is [facility]. No new concerns at this time.     History provided for reference purposes only Update 07/14/2020 JM: [name] returns for 4323-month follow-up regarding neck pain/occipital headache, chronic gait instability and mild cognitive impairment.   Chronic gait abnormality: Gradual onset since 2013.  3 sessions with PT with last session 02/06/2020.  She has had a couple falls since prior visit with minor skin abrasion but otherwise no other injury.  She denies losing balance or legs giving out but more due to tripping or not lifting her legs high enough  Neck pain/occipital headache: Stable.  Ongoing benefit of medication.  Continues on Cymbalta and Flexeril without side effects  Mild cognitive impairment stable without worsening.  Does report continued depression and anxiety and is interested in referral to behavioral health  Update 01/07/2020 JM [name] is a 81 year old female who is being seen today for 7023-month follow-up with prior visit 06/2019.  Ongoing use of Cymbalta and and flexeril with ongoing benefit regarding neck pain and anxiety.  She did undergo right reverse total  shoulder arthroplasty on 09/09/2019 due to osteoarthritis tolerated well without complication.  She does endorse increased falls since the holidays and is unsure if this is due to tripping or loss of balance.  Falls typically occur when ambulating on uneven surfaces or walking her dog.  She does have left orbital ecchymosis from prior fall.  Denies loss of consciousness or any other injuries.  She is interested in participating in outpatient PT for possible deconditioning.  At prior visit, she was referred to behavioral health but she did not make initial evaluation as she was not interested in virtual visits.  She does states she will participate when she is able to be seen in the office.  Memory has been stable.  No further concerns at this time.   History: [name] is a 81 year old female with PMH  hypertension, orthostatic hypotension, depression anxiety, chronic insomnia, hyperlipidemia, congenital left arm defect, vitamin D deficiency, cervical decompression more than 30 years ago with benefit.  Initially evaluated by Dr. Terrace ArabiaYan on 02/06/2017 after PCP referred to our office for evaluation of memory loss and head injury with ongoing complaints of memory disturbance, altered mental status, generalized weakness and speech difficulty.  In 11/2016, had a fall resulting in abrasion and transient loss of consciousness but no further injuries. She reported gradual onset gait abnormality since 2013, fell often, legs give out underneath her often, right since to be more frequent, she denies bilateral feet paresthesia, denies bowel and bladder incontinence.  MRI brain 02/2017-generalized atrophy supratentorium small vessel disease EEG 05/2017 mild generalized slowing during admission acute  encephalopathy EMG/NCV 07/2017 moderate right carpal tunnel syndrome MRI cervical spine 11/2016 no evidence of nerve root or spinal cord compression with only mild to moderate DJD CT head 01/2019 traumatic right frontal lobe  parenchymal hemorrhage post fall       REVIEW OF SYSTEMS: Out of a complete 14 system review of symptoms, the patient complains only of the following symptoms, and all other reviewed systems are negative. Depression, anxiety, gait impairment and neck pain   ALLERGIES: Allergies  Allergen Reactions   Norvasc [Amlodipine] Swelling    Pt not aware of    Benicar [Olmesartan] Other (See Comments)    Nervousness Pt not aware of    Lisinopril Cough    Pt not aware of     HOME MEDICATIONS: Outpatient Medications Prior to Visit  Medication Sig Dispense Refill   diphenhydramine-acetaminophen (TYLENOL PM) 25-500 MG TABS tablet Take 3 tablets by mouth at bedtime.     metoprolol succinate (TOPROL-XL) 50 MG 24 hr tablet Take 75 mg by mouth 2 (two) times daily. Take with or immediately following a meal.  "takes 1.5 of the pills."     cyclobenzaprine (FLEXERIL) 5 MG tablet Take 1 tablet (5 mg total) by mouth every 8 (eight) hours as needed for muscle spasms. 180 tablet 1   DULoxetine (CYMBALTA) 60 MG capsule Take 1 capsule (60 mg total) by mouth daily. 90 capsule 3   diphenhydrAMINE (BENADRYL) 25 MG tablet Take 1 tablet (25 mg total) by mouth at bedtime as needed for up to 10 doses for itching. 10 tablet 0   hydrOXYzine (ATARAX/VISTARIL) 25 MG tablet Take 1 tablet (25 mg total) by mouth every 8 (eight) hours as needed for up to 18 doses for itching. (Patient not taking: Reported on 10/27/2021) 18 tablet 0   No facility-administered medications prior to visit.    PAST MEDICAL HISTORY: Past Medical History:  Diagnosis Date   Allergic rhinitis    Anxiety    Arthritis    right shoulder   Congenital defect    left arm   Depression    GERD (gastroesophageal reflux disease)    Headache    Hyperlipemia    Hypertension    IBS (irritable bowel syndrome)    Insomnia    Memory loss    Vitamin D deficiency    Wears glasses     PAST SURGICAL HISTORY: Past Surgical History:  Procedure  Laterality Date   ABDOMINAL HYSTERECTOMY     CATARACT EXTRACTION Bilateral    NECK SURGERY     REVERSE SHOULDER ARTHROPLASTY Right 09/09/2019   Procedure: REVERSE SHOULDER ARTHROPLASTY;  Surgeon: Ernest Mallickreighton, James J III, MD;  Location: [facility] OR;  Service: Orthopedics;  Laterality: Right;  120min    FAMILY HISTORY: Family History  Adopted: Yes  Problem Relation Age of Onset   Breast cancer Mother    Other Father        unsure of history    SOCIAL HISTORY: Social History   Socioeconomic History   Marital status: Widowed    Spouse name: Not on file   Number of children: 1   Years of education: HS   Highest education level: Not on file  Occupational History   Occupation: Retired  Tobacco Use   Smoking status: Never   Smokeless tobacco: Never  Vaping Use   Vaping Use: Never used  Substance and Sexual Activity   Alcohol use: No   Drug use: No   Sexual activity: Not on file  Other Topics  Concern   Not on file  Social History Narrative   Lives at [facility] at this time.  Daughter Erskine Squibb and Victorino Dike.    Right-handed.   No caffeine use.   Social Determinants of Health   Financial Resource Strain: Not on file  Food Insecurity: Not on file  Transportation Needs: Not on file  Physical Activity: Not on file  Stress: Not on file  Social Connections: Not on file  Intimate Partner Violence: Not on file      PHYSICAL EXAM  Vitals:   01/04/22 1504 01/04/22 1534  BP: (!) 190/81 (!) 182/88  Pulse: 81   Weight: 142 lb (64.4 kg)   Height: 5\' 3"  (1.6 m)    Body mass index is 25.15 kg/m.  General: well developed, well nourished, very pleasant elderly Caucasian female, seated, in no evident distress Head: head normocephalic and atraumatic.   Neck: supple with no carotid or supraclavicular bruits Cardiovascular: regular rate and rhythm, no murmurs Musculoskeletal: no deformity; LUE deformity distal Skin:  no rash/petichiae Vascular:  Normal pulses all  extremities   Neurologic Exam Mental Status: Awake and fully alert. Oriented to place and time. Recent and remote memory intact. Attention span, concentration and fund of knowledge appropriate. Mood and affect appropriate.  Cranial Nerves: Pupils equal, briskly reactive to light. Extraocular movements full without nystagmus. Visual fields full to confrontation. Hearing intact. Facial sensation intact. Face, tongue, palate moves normally and symmetrically.  Motor: Normal bulk and tone. Normal strength in all tested extremity muscles. Sensory.: intact to touch , pinprick , position and vibratory sensation.  Coordination: Rapid alternating movements normal in all extremities. Finger-to-nose and heel-to-shin performed accurately bilaterally. Gait and Station: Arises from chair without difficulty. Stance is normal. Gait demonstrates normal stride length and mild imbalance with difficulty performing tandem walk and walking on heels but is able to ambulate on toes without great difficulty Reflexes: 1+ and symmetric. Toes downgoing.      DIAGNOSTIC DATA (LABS, IMAGING, TESTING) - I reviewed patient records, labs, notes, testing and imaging myself where available.  Lab Results  Component Value Date   WBC 6.1 10/27/2021   HGB 13.5 10/27/2021   HCT 42.4 10/27/2021   MCV 84.6 10/27/2021   PLT 263 10/27/2021      Component Value Date/Time   NA 140 10/27/2021 1322   NA 141 02/06/2017 1616   K 3.8 10/27/2021 1322   CL 105 10/27/2021 1322   CO2 27 10/27/2021 1322   GLUCOSE 124 (H) 10/27/2021 1322   BUN 11 10/27/2021 1322   BUN 13 02/06/2017 1616   CREATININE 0.80 10/27/2021 1322   CALCIUM 9.4 10/27/2021 1322   PROT 5.9 (L) 05/01/2017 0715   PROT 6.7 02/06/2017 1616   ALBUMIN 4.0 05/12/2017 1053   AST 23 05/01/2017 0715   ALT 19 05/01/2017 0715   ALKPHOS 63 05/01/2017 0715   BILITOT 0.5 05/01/2017 0715   BILITOT <0.2 02/06/2017 1616   GFRNONAA >60 10/27/2021 1322   GFRAA >60 09/10/2019  0549   Lab Results  Component Value Date   VITAMINB12 504 08/31/2017   Lab Results  Component Value Date   TSH 2.640 08/31/2017      ASSESSMENT AND PLAN 81 y.o. year old female  has a past medical history of Allergic rhinitis, Anxiety, Arthritis, Congenital defect, Depression, GERD (gastroesophageal reflux disease), Headache, Hyperlipemia, Hypertension, IBS (irritable bowel syndrome), Insomnia, Memory loss, Vitamin D deficiency, and Wears glasses. here with:  1.  Neck pain 2.  Gait abnormality 3.  Memory loss 3.  Anxiety/depression  Continuation of Flexeril 5 mg twice daily and Cymbalta 60 mg daily for ongoing benefit of neck pain -refills provided Referral placed to behavioral health for ongoing depression/anxiety monitoring management - advised to call if she does not hear anything    Follow-up in 1 year or call earlier if needed   I spent 20 minutes of face-to-face and non-face-to-face time with patient.  This included previsit chart review, lab review, study review, order entry, electronic health record documentation, patient education regarding neck pain with ongoing medication use, gait abnormality, anxiety/depression, cognitive impairment and answered all questions to patient satisfaction  Ihor Austin, AGNP-BC  [facility] [address] [place], [place] [address]  Phone [phone] Fax [phone] Note: This document was prepared with digital dictation and possible smart phrase technology. Any transcriptional errors that result from this process are unintentional.

## 2022-01-18 ENCOUNTER — Telehealth: Payer: Self-pay | Admitting: Adult Health

## 2022-01-18 NOTE — Telephone Encounter (Signed)
Contacted pt, LVM rq call back  

## 2022-01-18 NOTE — Telephone Encounter (Signed)
Pt would like a call from the nurse discuss provider recommending a therapist. Was discussed last office visit.

## 2022-01-18 NOTE — Telephone Encounter (Signed)
Pt returned call, states she is in need of therapy to talk to someone about her feelings and emotions. Informed her Shanda Bumps placed to referral at last OV 01/04/22 to [facility]. It looks like they tried to contact her today and LVM. Pt denies receiving a call or getting a message. Gave her number to [facility] [phone].

## 2022-01-20 ENCOUNTER — Ambulatory Visit (HOSPITAL_COMMUNITY)
Admission: RE | Admit: 2022-01-20 | Discharge: 2022-01-20 | Disposition: A | Discharge status: Home / Self Care | Payer: Federal, State, Local not specified - PPO | Attending: Psychiatry | Admitting: Psychiatry

## 2022-01-20 NOTE — H&P (Signed)
Behavioral Health Medical Screening Exam  Elizabeth [name] is a 81 y.o. female who presented to [facility] as a voluntary walk-in, accompanied by her step-son Bruce, for evaluation of "needing someone to talk to." Patient was seen, chart reviewed and case discussed with Dr Lucianne Muss. Patient stated she was sent here by her neurologist, patient stated she sent her here to have someone to talk to. Per chart review, patient was referred to [facility] outpatient therapy but the office she was referred to was not taking any new patients so her step-son brought her here. Patient and her step-son both appeared to have some cognitive impairment. They live together and while she talked a lot, he hardly said a word. She ruminated throughout the entire assessment about growing up in an orphanage in [place] and not knowing anything about her parents, not even where they were buried. She rambled on and on about this. She stated she was sent to [facility] after graduation to learn a skill and she worked for [employer] for 30 years.  She is not currently in therapy and does not see a psychiatrist.  Patient is calm and cooperative. She is dressed appropriately, her hygiene appears adequate. She is disabled and has no left hand. Patient is on an antidepressant but is unable to say the name of this medication. She stated she feels depressed, worthless with no purpose, tearful and isolates. She is especially tearful when she thinks of the girls she lived in the orphanage with. She stated she has had suicidal thoughts almost daily since childhood, in the context of wishing she would just "not be here anymore." She has no plan, intent or access to means. She denies previous suicide attempts. She denies NSSIB. She denies hallucinations, however she sees and hears bugs flying around in the morning when she wakes up. She stated she closed the toilet and plugged the bathtub to help get rid of them. She denies memory problems but stated the year as  [birth year], 1924, she knew the month, president and the last major holiday. She stated "[name] is the one with dementia."  Patient stated her husband passed away in 04-23-94 and she recently had a pet pass away. Patient is agreeable to receiving outpatient resources for therapy and medication management.   Patient does not meet criteria for inpatient psychiatric admission. She was provided with resources for [facility] and [facility].   Total Time spent with patient: 30 minutes  Psychiatric Specialty Exam:  Presentation  General Appearance: Appropriate for Environment; Casual  Eye Contact:Good  Speech:Clear and Coherent; Normal Rate  Speech Volume:Normal  Handedness:Right  Mood and Affect  Mood:Euthymic  Affect:Appropriate; Congruent  Thought Process  Thought Processes:Coherent  Descriptions of Associations:Tangential  Orientation:Full (Time, Place and Person)  Thought Content:Rumination; Tangential; Scattered  History of Schizophrenia/Schizoaffective disorder:No  Duration of Psychotic Symptoms:Greater than six months  Hallucinations:Hallucinations: None  Ideas of Reference:None  Suicidal Thoughts:Suicidal Thoughts: No  Homicidal Thoughts:Homicidal Thoughts: No   Sensorium  Memory:Immediate Fair; Recent Poor; Remote Poor  Judgment:Poor  Insight:Shallow  Executive Functions  Concentration:Fair  Attention Span:Fair  Recall:Poor  Fund of Knowledge:Fair  Language:Good  Psychomotor Activity  Psychomotor Activity:Psychomotor Activity: Normal  Assets  Assets:Communication Skills; Housing; Health and safety inspector; Transportation  Sleep  Sleep:Sleep: Fair  Physical Exam: Physical Exam Vitals reviewed.  HENT:     Head: Normocephalic.     Nose: Nose normal.  Eyes:     Pupils: Pupils are equal, round, and reactive to light.  Pulmonary:  Effort: Pulmonary effort is normal.  Musculoskeletal:        General: Normal range of motion.      Cervical back: Normal range of motion.  Neurological:     General: No focal deficit present.     Mental Status: She is alert.  Psychiatric:        Attention and Perception: Perception normal. She does not perceive auditory or visual hallucinations.        Mood and Affect: Mood normal.        Speech: Speech normal.        Behavior: Behavior normal.        Thought Content: Thought content normal. Thought content is not paranoid or delusional. Thought content does not include homicidal or suicidal ideation. Thought content does not include homicidal or suicidal plan.        Cognition and Memory: Cognition is impaired.   Review of Systems  Constitutional:  Negative for fever.  HENT:  Negative for congestion and sore throat.   Respiratory:  Negative for cough and shortness of breath.   Cardiovascular:  Negative for chest pain.  Neurological: Negative.   Psychiatric/Behavioral:  Positive for memory loss (mild cognitive impairment per chart review).    Blood pressure 127/62, pulse 73, temperature 98.2 F (36.8 C), temperature source Oral, resp. rate 16, SpO2 99 %. There is no height or weight on file to calculate BMI.  Musculoskeletal: Strength & Muscle Tone: within normal limits Gait & Station: normal Patient leans: N/A  Recommendations:  Based on my evaluation the patient does not appear to have an emergency medical condition. Patient doe snot meet criteria for inpatient psychiatric admission. Patient was provided with resources to [facility] and [facility] for therapy and medication management.   Laveda Abbe, NP 01/20/2022, 5:27 PM

## 2022-01-20 NOTE — BH Assessment (Addendum)
Comprehensive Clinical Assessment (CCA) Note  01/20/2022 Elizabeth [name] [id number]   Disposition: TTS completed. Per Elizabeth Guadeloupe, NP, patient is psych cleared and recommended to follow up with a outpatient providers for medication management. She was given follow up referrals to [facility] and/or Dr Elizabeth [name].   Flowsheet Row OP [facility] from 10/27/2021 in [facility] ED from 04/15/2021 in [facility]  C-SSRS RISK CATEGORY Low Risk No Risk No Risk      The patient demonstrates the following risk factors for suicide: Chronic risk factors for suicide include: psychiatric disorder of Major Depressive Disorder, Recurrent, Severe, w/ psychotic features; Anxiety Disorder . Acute risk factors for suicide include: social withdrawal/isolation and unresolved feelings of abandonment and grief from recent loss of her dog . Protective factors for this patient include: responsibility to others (children, family). Considering these factors, the overall suicide risk at this point appears to be low. Patient is appropriate for outpatient follow up. .  Chief Complaint:  Chief Complaint  Patient presents with   Psychiatric Evaluation   Visit Diagnosis: Major Depressive Disorder, Recurrent, Severe, w/ psychotic features; Anxiety Disorder Patient presents to [facility] as a wak-in, accompanied by her step son Elizabeth [name]). She reports being referred by her neurologist for the following reason, "I want someone to talk to about being an orphan, I don't know where my mother and father are buried, they put in a home in [place] and lived at that home until I was 81 y/o, then went to live in [place], [place], where I learned to type, and worked for [employer] x30 yrs". Patient also with symptoms of depression that are daily and ongoing; chronic related symptoms. Also, chronic suicidal thoughts  since childhood. Denies plan and intent. No access to means. She says that her dog recently passed away and this is a Immunologist for her. Also, has constant thoughts of being abandoned by her bio parents and placed in an orphanage as a child. States that she was often kept a secret because of her physical disabilities. Sleep is poor and has difficult getting to sleep. Appetite is fair. Denies HI. She was asked abour AVH's and says that she see's bugs on the wall every more. She can hear the bugs too. Denies hx of drug use. Currently lives in a household with step son and is a widow. No hx of inpatient psychiatrist treatment and does not have a therapist; only Nuerologist and PCP.  CCA Screening, Triage and Referral (STR)  Patient Reported Information How did you hear about [place]? No data recorded What Is the Reason for Your Visit/Call Today? Patient presents to [facility] as a wak-in, accompanied by her step son Elizabeth [name]). She reports being referred by her neurologist for the following reason, "I want someone to talk to about being an orphan, I don't know where my mother and father are buried, they put in a home in [place] and lived at that home until I was 81 y/o, then went to live in [place], [place], where I learned to type, and worked for [employer] x30 yrs". Patient also with symptoms of depression that are daily and ongoing; chronic related symptoms. Also, chronic suicidal thoughts since childhood. Denies plan and intent. She says that her dog recently passed away and this is a Immunologist for her. Sleep is poor and has difficult getting to sleep. Appetite is fair. Denies HI. She was asked abour AVH's and  says that she see's bugs on the wall every more. She can hear the bugs too. Denies hx of drug use. Currently lives in a household with step son and is a widow. No hx of inpatient psychiatrist treatment and does not have a therapist; only Nuerologist and PCP.  How Long Has This Been Causing You Problems? > than  6 months  What Do You Feel Would Help You the Most Today? Treatment for Depression or other mood problem; Medication(s)   Have You Recently Had Any Thoughts About Hurting Yourself? Yes  Are You Planning to Commit Suicide/Harm Yourself At This time? No   Have you Recently Had Thoughts About Hurting Someone Elizabeth [name]? No  Are You Planning to Harm Someone at This Time? No  Explanation: No data recorded  Have You Used Any Alcohol or Drugs in the Past 24 Hours? No  How Long Ago Did You Use Drugs or Alcohol? No data recorded What Did You Use and How Much? No data recorded  Do You Currently Have a Therapist/Psychiatrist? No  Name of Therapist/Psychiatrist: No data recorded  Have You Been Recently Discharged From Any Office Practice or Programs? No  Explanation of Discharge From Practice/Program: No data recorded    CCA Screening Triage Referral Assessment Type of Contact: Face-to-Face  Telemedicine Service Delivery:   Is this Initial or Reassessment? Initial Assessment  Date Telepsych consult ordered in [facility]:  No data recorded Time Telepsych consult ordered in [facility]:  No data recorded Location of Assessment: No data recorded Provider [facility]   Collateral Involvement: Step son "Elizabeth CordsBruce" provided limited information. However, present during todays TTS assessment.   Does Patient Have a Automotive engineerCourt Appointed Legal Guardian? No data recorded Name and Contact of Legal Guardian: No data recorded If Minor and Not Living with Parent(s), Who has Custody? No data recorded Is CPS involved or ever been involved? Never  Is APS involved or ever been involved? Never   Patient Determined To Be At Risk for Harm To Self or Others Based on Review of Patient Reported Information or Presenting Complaint? No  Method: No data recorded Availability of Means: No data recorded Intent: No data recorded Notification Required: No data recorded Additional Information for Danger to  Others Potential: No data recorded Additional Comments for Danger to Others Potential: No data recorded Are There Guns or Other Weapons in Your Home? No data recorded Types of Guns/Weapons: No data recorded Are These Weapons Safely Secured?                            No data recorded Who Could Verify You Are Able To Have These Secured: No data recorded Do You Have any Outstanding Charges, Pending Court Dates, Parole/Probation? No data recorded Contacted To Inform of Risk of Harm To Self or Others: No data recorded   Does Patient Present under Involuntary Commitment? No  IVC Papers Initial File Date: No data recorded  [place]   Patient Currently Receiving the Following Services: Medication Management   Determination of Need: Routine (7 days)   Options For Referral: Medication Management; Outpatient Therapy     CCA Biopsychosocial Patient Reported Schizophrenia/Schizoaffective Diagnosis in Past: No   Strengths: No data recorded  Mental Health Symptoms Depression:   Difficulty Concentrating; Hopelessness; Fatigue; Change in energy/activity; Tearfulness; Sleep (too much or little)   Duration of Depressive symptoms:  Duration of Depressive Symptoms: Greater than two weeks   Mania:  None   Anxiety:    Tension; Worrying; Restlessness; Difficulty concentrating   Psychosis:   Delusions; Hallucinations (Reports seeing and hearing bugs in her home)   Duration of Psychotic symptoms:  Duration of Psychotic Symptoms: Greater than six months   Trauma:   None   Obsessions:   None   Compulsions:   None   Inattention:   None   Hyperactivity/Impulsivity:   None   Oppositional/Defiant Behaviors:   None   Emotional Irregularity:   Chronic feelings of emptiness; Frantic efforts to avoid abandonment   Other Mood/Personality Symptoms:   cooperative with answering questions; anxious; questionable cognitive impairment; some confusion     Mental Status Exam Appearance and self-care  Stature:   Average   Weight:   Average weight   Clothing:  No data recorded  Grooming:   Normal   Cosmetic use:   None   Posture/gait:   Normal   Motor activity:   Restless   Sensorium  Attention:   Normal   Concentration:   Normal   Orientation:   Time; Situation; Place; Object; Person   Recall/memory:   Normal   Affect and Mood  Affect:   Depressed; Anxious   Mood:   Anxious; Depressed   Relating  Eye contact:   None   Facial expression:   Anxious; Depressed; Sad; Tense   Attitude toward examiner:   Cooperative   Thought and Language  Speech flow:  Clear and Coherent   Thought content:   Appropriate to Mood and Circumstances   Preoccupation:   None   Hallucinations:   Auditory   Organization:  No data recorded  [employer] of Knowledge:   Average   Intelligence:   Above Average   Abstraction:   Overly abstract   Judgement:   Impaired   Reality Testing:   Adequate   Insight:   Lacking; Poor   Decision Making:   Confused   Social Functioning  Social Maturity:   Isolates   Social Judgement:   Heedless   Stress  Stressors:   Grief/losses; Other (Comment) (dog recently passed away)   Coping Ability:   Normal   Skill Deficits:   Self-care; Self-control; Interpersonal; Decision making   Supports:   Support needed     Religion: Religion/Spirituality Are You A Religious Person?: No (unknown)  Leisure/Recreation: Leisure / Recreation Do You Have Hobbies?: No  Exercise/Diet: Exercise/Diet Do You Exercise?: No Have You Gained or Lost A Significant Amount of Weight in the Past Six Months?: No Do You Follow a Special Diet?: No Do You Have Any Trouble Sleeping?: Yes Explanation of Sleeping Difficulties: has dfificulty falling asleep   CCA Employment/Education Employment/Work Situation: Employment / Work Psychologist, occupational Employment Situation:  Retired Passenger transport manager has Been Impacted by Current Illness: No Has Patient ever Been in Equities trader?: No  Education: Education Is Patient Currently Attending School?: No Last Grade Completed:  (unknown) Did You Product manager?: No Did You Have An Individualized Education Program (IIEP): No Did You Have Any Difficulty At School?: No Patient's Education Has Been Impacted by Current Illness: No   CCA Family/Childhood History Family and Relationship History: Family history Marital status: Widowed Does patient have children?: No (No bio children. However, has a step son.)  Childhood History:  Childhood History By whom was/is the patient raised?: Other (Comment) (raised in an orphan home) Did patient suffer any verbal/emotional/physical/sexual abuse as a child?: No Did patient suffer from severe childhood neglect?: No Has patient ever  been sexually abused/assaulted/raped as an adolescent or adult?: No Was the patient ever a victim of a crime or a disaster?: No Witnessed domestic violence?: No Has patient been affected by domestic violence as an adult?: No  Child/Adolescent Assessment:     CCA Substance Use Alcohol/Drug Use: Alcohol / Drug Use Pain Medications: SEE MAR Prescriptions: SEE MAR Over the Counter: SEE MAR History of alcohol / drug use?: No history of alcohol / drug abuse                         ASAM's:  Six Dimensions of Multidimensional Assessment  Dimension 1:  Acute Intoxication and/or Withdrawal Potential:      Dimension 2:  Biomedical Conditions and Complications:      Dimension 3:  Emotional, Behavioral, or Cognitive Conditions and Complications:     Dimension 4:  Readiness to Change:     Dimension 5:  Relapse, Continued use, or Continued Problem Potential:     Dimension 6:  Recovery/Living Environment:     ASAM Severity Score:    ASAM Recommended Level of Treatment:     Substance use Disorder (SUD)    Recommendations for  Services/Supports/Treatments: Recommendations for Services/Supports/Treatments Recommendations For Services/Supports/Treatments: Medication Management, Individual Therapy  Discharge Disposition:    DSM5 Diagnoses: Patient Active Problem List   Diagnosis Date Noted   S/p reverse total shoulder arthroplasty 09/09/2019   Neck pain 08/31/2017   Encephalopathy 05/11/2017   Orthostatic hypotension 05/01/2017   Generalized weakness 04/30/2017   Hypertension 04/30/2017   Depression 04/30/2017   Right arm pain 04/30/2017   Anxiety 04/30/2017   Passed out 02/06/2017   Memory loss 02/06/2017     Referrals to Alternative Service(s): Referred to Alternative Service(s):   Place:   Date:   Time:    Referred to Alternative Service(s):   Place:   Date:   Time:    Referred to Alternative Service(s):   Place:   Date:   Time:    Referred to Alternative Service(s):   Place:   Date:   Time:     Melynda Ripple, Counselor

## 2022-02-02 DIAGNOSIS — B379 Candidiasis, unspecified: Secondary | ICD-10-CM | POA: Diagnosis not present

## 2022-02-14 DIAGNOSIS — F4320 Adjustment disorder, unspecified: Secondary | ICD-10-CM | POA: Diagnosis not present

## 2022-02-21 DIAGNOSIS — F4320 Adjustment disorder, unspecified: Secondary | ICD-10-CM | POA: Diagnosis not present

## 2022-03-07 ENCOUNTER — Other Ambulatory Visit: Payer: Self-pay

## 2022-03-07 ENCOUNTER — Emergency Department (HOSPITAL_BASED_OUTPATIENT_CLINIC_OR_DEPARTMENT_OTHER): Payer: Medicare Other

## 2022-03-07 ENCOUNTER — Emergency Department (HOSPITAL_BASED_OUTPATIENT_CLINIC_OR_DEPARTMENT_OTHER)
Admission: EM | Admit: 2022-03-07 | Discharge: 2022-03-07 | Disposition: A | Discharge status: Home / Self Care | Payer: Medicare Other | Attending: Emergency Medicine | Admitting: Emergency Medicine

## 2022-03-07 ENCOUNTER — Encounter (HOSPITAL_BASED_OUTPATIENT_CLINIC_OR_DEPARTMENT_OTHER): Payer: Self-pay | Admitting: *Deleted

## 2022-03-07 DIAGNOSIS — S60811A Abrasion of right wrist, initial encounter: Secondary | ICD-10-CM

## 2022-03-07 DIAGNOSIS — Z23 Encounter for immunization: Secondary | ICD-10-CM | POA: Insufficient documentation

## 2022-03-07 DIAGNOSIS — W228XXA Striking against or struck by other objects, initial encounter: Secondary | ICD-10-CM | POA: Diagnosis not present

## 2022-03-07 DIAGNOSIS — S60211A Contusion of right wrist, initial encounter: Secondary | ICD-10-CM | POA: Diagnosis not present

## 2022-03-07 DIAGNOSIS — S6991XA Unspecified injury of right wrist, hand and finger(s), initial encounter: Secondary | ICD-10-CM | POA: Diagnosis present

## 2022-03-07 DIAGNOSIS — M7989 Other specified soft tissue disorders: Secondary | ICD-10-CM | POA: Diagnosis not present

## 2022-03-07 DIAGNOSIS — F4320 Adjustment disorder, unspecified: Secondary | ICD-10-CM | POA: Diagnosis not present

## 2022-03-07 MED ORDER — TETANUS-DIPHTH-ACELL PERTUSSIS 5-2.5-18.5 LF-MCG/0.5 IM SUSY
0.5000 mL | PREFILLED_SYRINGE | Freq: Once | INTRAMUSCULAR | Status: AC
  Administered 2022-03-07: 0.5 mL via INTRAMUSCULAR
  Filled 2022-03-07: qty 0.5

## 2022-03-07 NOTE — ED Triage Notes (Signed)
Pt says she was trying to get up out of bed and her arm got stuck in between the bed rails. C/o pain in the right wrist that goes up the right arm. Abrasion to the wrist, unknown last tetanus.  ?

## 2022-03-07 NOTE — ED Provider Notes (Signed)
? ?[facility]  ?Provider Note ? ?CSN: [id number] ?Arrival date & time: 03/07/22 0336 ? ?History ?Chief Complaint  ?Patient presents with  ? Wrist Pain  ? ? ?Elizabeth [name] is a 81 y.o. 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 She sustained a small abrasion, complaining of pain in R wrist that radiates up. No there injuries. Unsure last TDAP.  ? ? ?Home Medications ?Prior to Admission medications   ?Medication Sig Start Date End Date Taking? Authorizing Provider  ?cyclobenzaprine (FLEXERIL) 5 MG tablet Take 1 tablet (5 mg total) by mouth every 8 (eight) hours as needed for muscle spasms. 01/04/22   Ihor Austin, NP  ?diphenhydramine-acetaminophen (TYLENOL PM) 25-500 MG TABS tablet Take 3 tablets by mouth at bedtime.    [provider]  ?DULoxetine (CYMBALTA) 60 MG capsule Take 1 capsule (60 mg total) by mouth daily. 01/04/22   Ihor Austin, NP  ?metoprolol succinate (TOPROL-XL) 50 MG 24 hr tablet Take 75 mg by mouth 2 (two) times daily. Take with or immediately following a meal.  "takes 1.5 of the pills."    [provider]  ? ? ? ?Allergies    ?Norvasc [amlodipine], Benicar [olmesartan], and Lisinopril ? ? ?Review of Systems   ?Review of Systems ?Please see HPI for pertinent positives and negatives ? ?Physical Exam ?BP (!) 169/84   Pulse 76   Temp (!) 97.3 ?F (36.3 ?C) (Oral)   Resp 16   SpO2 95%  ? ?Physical Exam ?Vitals and nursing note reviewed.  ?HENT:  ?   Head: Normocephalic.  ?   Nose: Nose normal.  ?Eyes:  ?   Extraocular Movements: Extraocular movements intact.  ?Pulmonary:  ?   Effort: Pulmonary effort is normal.  ?Musculoskeletal:     ?   General: Swelling and tenderness present.  ?   Cervical back: Neck supple.  ?   Comments: Superficial abrasion R dorsal wrist, normal pulse  ?Skin: ?   Findings: No rash (on exposed skin).  ?Neurological:  ?    Mental Status: She is alert and oriented to person, place, and time.  ?Psychiatric:     ?   Mood and Affect: Mood normal.  ? ? ?ED Results / Procedures / Treatments   ?EKG ?None ? ?Procedures ?Procedures ? ?Medications Ordered in the ED ?Medications  ?Tdap (BOOSTRIX) injection 0.5 mL (has no administration in time range)  ? ? ?Initial Impression and Plan ? Patient with R wrist injury, send for xray. TDAP updated.  ? ?ED Course  ? ?Clinical Course as of 03/07/22 0420  ?Mon Mar 07, 2022  ?[id number] I personally viewed the images from radiology studies and agree with radiologist interpretation: Xray neg for fx. Plan wrist brace for comfort and PCP follow up.  ? [CS]  ?  ?Clinical Course User Index ?[CS] Pollyann Savoy, MD  ? ? ? ?MDM Rules/Calculators/A&P ?Medical Decision Making ?Problems Addressed: ?Abrasion of right wrist, initial encounter: acute illness or injury ?Contusion of right wrist, initial encounter: acute illness or injury ? ?Amount and/or Complexity of Data Reviewed ?Radiology: ordered and independent interpretation performed. Decision-making details documented in ED Course. ? ?Risk ?Prescription drug management. ? ? ? ?Final Clinical Impression(s) / ED Diagnoses ?Final diagnoses:  ?Contusion of right wrist, initial encounter  ?Abrasion of right wrist, initial encounter  ? ? ?Rx / DC Orders ?  ED Discharge Orders   ? ? None  ? ?  ? ?  ?Pollyann Savoy, MD ?03/07/22 [id number] ? ?

## 2022-03-22 DIAGNOSIS — F4320 Adjustment disorder, unspecified: Secondary | ICD-10-CM | POA: Diagnosis not present

## 2022-03-25 DIAGNOSIS — Z20822 Contact with and (suspected) exposure to covid-19: Secondary | ICD-10-CM | POA: Diagnosis not present

## 2022-03-28 DIAGNOSIS — F341 Dysthymic disorder: Secondary | ICD-10-CM | POA: Diagnosis not present

## 2022-03-28 DIAGNOSIS — G4709 Other insomnia: Secondary | ICD-10-CM | POA: Diagnosis not present

## 2022-03-28 DIAGNOSIS — I10 Essential (primary) hypertension: Secondary | ICD-10-CM | POA: Diagnosis not present

## 2022-03-29 ENCOUNTER — Emergency Department (HOSPITAL_BASED_OUTPATIENT_CLINIC_OR_DEPARTMENT_OTHER)
Admission: EM | Admit: 2022-03-29 | Discharge: 2022-03-29 | Disposition: A | Discharge status: Home / Self Care | Payer: Medicare Other | Attending: Emergency Medicine | Admitting: Emergency Medicine

## 2022-03-29 ENCOUNTER — Other Ambulatory Visit: Payer: Self-pay

## 2022-03-29 ENCOUNTER — Encounter (HOSPITAL_BASED_OUTPATIENT_CLINIC_OR_DEPARTMENT_OTHER): Payer: Self-pay

## 2022-03-29 DIAGNOSIS — Z79899 Other long term (current) drug therapy: Secondary | ICD-10-CM | POA: Diagnosis not present

## 2022-03-29 DIAGNOSIS — I10 Essential (primary) hypertension: Secondary | ICD-10-CM | POA: Diagnosis not present

## 2022-03-29 NOTE — Discharge Instructions (Addendum)
Continue your blood pressure checks for trending as per the request of your primary care doctor.  The actual number does not matter it just needs to be trended so they can decide how to adjust your blood pressure medicine if at all.  You do need to get seen for severe headache severe chest pain, trouble breathing, or any strokelike symptoms.  Otherwise just trend the blood pressure. ?

## 2022-03-29 NOTE — ED Triage Notes (Signed)
Patient here POV from Home. ? ?Patient endorses her BP was high at her PCP Office yesterday. BP was above [id number] Systolic at the Office.  ? ?No Changes in BP Medication recently. No Symptoms. Endorses recent Stressors in Life that may be causing increase in BP. ? ?NAD Noted during Triage. A&Ox4. GCS 15. Ambulatory.  ?

## 2022-03-29 NOTE — ED Provider Notes (Signed)
?[facility] ?Provider Note ? ? ?CSN: [id number] ?Arrival date & time: 2022/04/26  1854 ? ?  ? ?History ? ?Chief Complaint  ?Patient presents with  ? Hypertension  ? ? ?Elizabeth [name] is a 81 y.o. female. ? ?Patient seen by her [facility] physician yesterday.  Blood pressure noted to be high.  She was asked to check her blood pressure 4 times over the next several days for a trending of her blood pressure.  Patient went to the fire station today and her blood pressure was elevated and the firefighters insisted that she get seen in the emergency department.  Patient has no symptoms.  Does have a mild headache not a severe headache no strokelike symptoms no chest pain no trouble breathing.  Patient has a history of hypertension and is on antihypertensive meds.  They are just trying to see if she needs an adjustment.  She is having a lot of stressors in her life currently. ? ?Past medical history sniffing for hypertension gastroesophageal reflux disease hyperlipidemia.  Congenital defect to her left hand. ? ? ?  ? ?Home Medications ?Prior to Admission medications   ?Medication Sig Start Date End Date Taking? Authorizing Provider  ?cyclobenzaprine (FLEXERIL) 5 MG tablet Take 1 tablet (5 mg total) by mouth every 8 (eight) hours as needed for muscle spasms. 01/04/22   Ihor Austin, NP  ?diphenhydramine-acetaminophen (TYLENOL PM) 25-500 MG TABS tablet Take 3 tablets by mouth at bedtime.    [provider]  ?DULoxetine (CYMBALTA) 60 MG capsule Take 1 capsule (60 mg total) by mouth daily. 01/04/22   Ihor Austin, NP  ?metoprolol succinate (TOPROL-XL) 50 MG 24 hr tablet Take 75 mg by mouth 2 (two) times daily. Take with or immediately following a meal.  "takes 1.5 of the pills."    [provider]  ?   ? ?Allergies    ?Norvasc [amlodipine], Benicar [olmesartan], and Lisinopril   ? ?Review of Systems   ?Review of Systems  ?Constitutional:  Negative for chills and fever.  ?HENT:   Negative for ear pain and sore throat.   ?Eyes:  Negative for pain and visual disturbance.  ?Respiratory:  Negative for cough and shortness of breath.   ?Cardiovascular:  Negative for chest pain and palpitations.  ?Gastrointestinal:  Negative for abdominal pain and vomiting.  ?Genitourinary:  Negative for dysuria and hematuria.  ?Musculoskeletal:  Negative for arthralgias and back pain.  ?Skin:  Negative for color change and rash.  ?Neurological:  Positive for headaches. Negative for seizures and syncope.  ?All other systems reviewed and are negative. ? ?Physical Exam ?Updated Vital Signs ?BP (!) 168/101 (BP Location: Right Arm)   Pulse 85   Temp 98.1 ?F (36.7 ?C) (Temporal)   Resp 16   Ht 1.6 m (5\' 3" )   Wt 64.4 kg   SpO2 96%   BMI 25.15 kg/m?  ?Physical Exam ?Vitals and nursing note reviewed.  ?Constitutional:   ?   [name]: She is not in acute distress. ?   Appearance: Normal appearance. She is well-developed.  ?HENT:  ?   Head: Normocephalic and atraumatic.  ?Eyes:  ?   Extraocular Movements: Extraocular movements intact.  ?   Conjunctiva/sclera: Conjunctivae normal.  ?   Pupils: Pupils are equal, round, and reactive to light.  ?Cardiovascular:  ?   Rate and Rhythm: Normal rate and regular rhythm.  ?   Heart sounds: No murmur heard. ?Pulmonary:  ?   Effort: Pulmonary effort is normal. No  respiratory distress.  ?   Breath sounds: Normal breath sounds. No wheezing, rhonchi or rales.  ?Abdominal:  ?   Palpations: Abdomen is soft.  ?   Tenderness: There is no abdominal tenderness.  ?Musculoskeletal:     ?   [name]: No swelling.  ?   Cervical back: Normal range of motion and neck supple.  ?   Comments: Congenital defect to the left hand that did not fully develop  ?Skin: ?   [name]: Skin is warm and dry.  ?   Capillary Refill: Capillary refill takes less than 2 seconds.  ?Neurological:  ?   [name]: No focal deficit present.  ?   Mental Status: She is alert and oriented to person, place, and time.  ?    Cranial Nerves: No cranial nerve deficit.  ?   Sensory: No sensory deficit.  ?   Motor: No weakness.  ?   Coordination: Coordination normal.  ?Psychiatric:     ?   Mood and Affect: Mood normal.  ? ? ?ED Results / Procedures / Treatments   ?Labs ?(all labs ordered are listed, but only abnormal results are displayed) ?Labs Reviewed - No data to display ? ?EKG ?None ? ?Radiology ?No results found. ? ?Procedures ?Procedures  ? ? ?Medications Ordered in ED ?Medications - No data to display ? ?ED Course/ Medical Decision Making/ A&P ?  ?                        ?Medical Decision Making ? ?Blood pressure here 168/101.  Patient without any significant symptoms.  Does have a headache but it is very mild.  Not concern for head bleed. ? ?Patient to continue to trend her blood pressure as per request of her [facility] physician.  And then follow-up with them.  Patient knows to return for severe headache strokelike symptoms chest pain or trouble breathing. ? ?Patient nontoxic no acute distress.  No focal findings. ? ? ?Final Clinical Impression(s) / ED Diagnoses ?Final diagnoses:  ?Primary hypertension  ? ? ?Rx / DC Orders ?ED Discharge Orders   ? ? None  ? ?  ? ? ?  ?Vanetta Mulders, MD ?2022/04/03 2008 ? ?

## 2022-04-06 DIAGNOSIS — R051 Acute cough: Secondary | ICD-10-CM | POA: Diagnosis not present

## 2022-04-06 DIAGNOSIS — I10 Essential (primary) hypertension: Secondary | ICD-10-CM | POA: Diagnosis not present

## 2022-04-06 DIAGNOSIS — B372 Candidiasis of skin and nail: Secondary | ICD-10-CM | POA: Diagnosis not present

## 2022-04-06 DIAGNOSIS — Z20822 Contact with and (suspected) exposure to covid-19: Secondary | ICD-10-CM | POA: Diagnosis not present

## 2022-04-06 DIAGNOSIS — R059 Cough, unspecified: Secondary | ICD-10-CM | POA: Diagnosis not present

## 2022-04-11 DIAGNOSIS — 419620001 Death: Secondary | SNOMED CT | POA: Diagnosis not present

## 2022-04-11 DEATH — deceased

## 2022-04-12 ENCOUNTER — Telehealth: Payer: Self-pay | Admitting: Adult Health

## 2022-04-12 NOTE — Telephone Encounter (Signed)
Pt returned phone call, relayed previous TE as instructed if pt calls back. ?

## 2022-04-12 NOTE — Telephone Encounter (Signed)
I called [facility] and spoke with tech who verified refill is current and ready for processing for Flexeril 5 mg tablets.  ?Pharmacy will work on getting med ready for her. ? ?I called pt back and left a vm updating on this advised to call back if she had questions TE staff can relay message.  ?

## 2022-04-12 NOTE — Telephone Encounter (Signed)
Pt states she needs refill of medication cyclobenzaprine (FLEXERIL) 5 MG tablet ?[facility] [phone] - told her today 04/12/22 they cannot refill this medication. ? pt would like to know where she needs to get a refill.  ?Pt would like call to discuss if she may get a refill and where the refill will be.  ?

## 2022-05-30 DIAGNOSIS — L309 Dermatitis, unspecified: Secondary | ICD-10-CM | POA: Diagnosis not present

## 2022-06-27 DIAGNOSIS — H1031 Unspecified acute conjunctivitis, right eye: Secondary | ICD-10-CM | POA: Diagnosis not present

## 2022-06-27 DIAGNOSIS — H60501 Unspecified acute noninfective otitis externa, right ear: Secondary | ICD-10-CM | POA: Diagnosis not present

## 2022-07-09 ENCOUNTER — Other Ambulatory Visit: Payer: Self-pay | Admitting: Adult Health

## 2022-07-12 ENCOUNTER — Ambulatory Visit (INDEPENDENT_AMBULATORY_CARE_PROVIDER_SITE_OTHER): Payer: Medicare Other | Admitting: Adult Health

## 2022-07-12 ENCOUNTER — Encounter: Payer: Self-pay | Admitting: Adult Health

## 2022-07-12 VITALS — BP 168/82 | HR 77 | Temp 97.3°F | Resp 18 | Ht 63.0 in | Wt 133.0 lb

## 2022-07-12 DIAGNOSIS — B070 Plantar wart: Secondary | ICD-10-CM

## 2022-07-12 DIAGNOSIS — H6690 Otitis media, unspecified, unspecified ear: Secondary | ICD-10-CM | POA: Diagnosis not present

## 2022-07-12 DIAGNOSIS — I10 Essential (primary) hypertension: Secondary | ICD-10-CM | POA: Diagnosis not present

## 2022-07-12 DIAGNOSIS — Z0000 Encounter for general adult medical examination without abnormal findings: Secondary | ICD-10-CM

## 2022-07-12 MED ORDER — AMOXICILLIN-POT CLAVULANATE 875-125 MG PO TABS: 1.0000 | ORAL_TABLET | Freq: Two times a day (BID) | ORAL | 0 refills | Status: AC

## 2022-07-12 NOTE — Progress Notes (Unsigned)
[facility] clinic  Provider:   Kenard Gower  DNP  Code Status: *** Goals of Care:     07/12/2022    1:32 PM  Advanced Directives  Does Patient Have a Medical Advance Directive? No  Would patient like information on creating a medical advance directive? No - Patient declined     Chief Complaint  Patient presents with   Establish Care    Patient is here to establish care, PT does have Pill bottles at initial appt. PT also having ringing in the ears. Elevated bp even with medication BID    HPI: Patient is a 81 y.o. 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 Lives with step sone who has stage 4 cancer  Past Medical History:  Diagnosis Date   Allergic rhinitis    Anxiety    Arthritis    right shoulder   Congenital defect    left arm   Depression    GERD (gastroesophageal reflux disease)    Headache    Hyperlipemia    Hypertension    IBS (irritable bowel syndrome)    Insomnia    Memory loss    Vitamin D deficiency    Wears glasses     Past Surgical History:  Procedure Laterality Date   ABDOMINAL HYSTERECTOMY     CATARACT EXTRACTION Bilateral    NECK SURGERY     REVERSE SHOULDER ARTHROPLASTY Right 09/09/2019   Procedure: REVERSE SHOULDER ARTHROPLASTY;  Surgeon: Ernest Mallick, MD;  Location: [facility] OR;  Service: Orthopedics;  Laterality: Right;     Allergies  Allergen Reactions   Norvasc [Amlodipine] Swelling    Pt not aware of    Benicar [Olmesartan] Other (See Comments)    Nervousness Pt not aware of    Lisinopril Cough    Pt not aware of     Outpatient Encounter Medications as of 07/12/2022  Medication Sig   cyclobenzaprine (FLEXERIL) 5 MG tablet TAKE 1 TABLET BY MOUTH EVERY 8 HOURS AS NEEDED FOR MUSCLE SPASM   metoprolol succinate (TOPROL-XL) 50 MG 24 hr tablet Take 75 mg by mouth 2 (two) times daily. Take with or immediately  following a meal.  "takes 1.5 of the pills."   neomycin-polymyxin-hydrocortisone (CORTISPORIN) OTIC solution 4 drops 3 (three) times daily. To be taken for 7 days   [DISCONTINUED] diphenhydramine-acetaminophen (TYLENOL PM) 25-500 MG TABS tablet Take 3 tablets by mouth at bedtime.   [DISCONTINUED] DULoxetine (CYMBALTA) 60 MG capsule Take 1 capsule (60 mg total) by mouth daily.   No facility-administered encounter medications on file as of 07/12/2022.    Review of Systems:  Review of Systems  Constitutional:  Negative for appetite change, chills, fatigue and fever.  HENT:  Negative for congestion, hearing loss, rhinorrhea and sore throat.   Eyes: Negative.   Respiratory:  Negative for cough, shortness of breath and wheezing.   Cardiovascular:  Negative for chest pain, palpitations and leg swelling.  Gastrointestinal:  Negative for abdominal pain, constipation, diarrhea, nausea and vomiting.  Genitourinary:  Negative for dysuria.  Musculoskeletal:  Negative for arthralgias, back pain and myalgias.  Skin:  Negative for color change, rash and wound.       Wart   Neurological:  Negative for dizziness, weakness and headaches.  Psychiatric/Behavioral:  Negative for behavioral problems. The patient is not nervous/anxious.  Health Maintenance  Topic Date Due   Zoster Vaccines- Shingrix (1 of 2) Never done   Pneumonia Vaccine 74+ Years old (1 - PCV) Never done   DEXA SCAN  Never done   COVID-19 Vaccine (2 - Pfizer series) 04/06/2020   INFLUENZA VACCINE  07/12/2022   TETANUS/TDAP  03/07/2032   HPV VACCINES  Aged Out    Physical Exam: Vitals:   07/12/22 1350  BP: (!) 168/82  Pulse: 77  Resp: 18  Temp: (!) 97.3 F (36.3 C)  TempSrc: Temporal  SpO2: 99%  Weight: 133 lb (60.3 kg)  Height: 5\' 3"  (1.6 m)   Body mass index is 23.56 kg/m. Physical Exam Constitutional:      General: She is not in acute distress. HENT:     Head: Normocephalic and atraumatic.     Nose: Nose normal.      Mouth/Throat:     Mouth: Mucous membranes are moist.  Eyes:     Conjunctiva/sclera: Conjunctivae normal.  Cardiovascular:     Rate and Rhythm: Normal rate and regular rhythm.  Pulmonary:     Effort: Pulmonary effort is normal.     Breath sounds: Normal breath sounds.  Abdominal:     General: Bowel sounds are normal.     Palpations: Abdomen is soft.  Musculoskeletal:        General: Normal range of motion.     Cervical back: Normal range of motion.  Skin:    General: Skin is warm and dry.     Comments: Plantar warts under right breast   Neurological:     General: No focal deficit present.     Mental Status: She is alert and oriented to person, place, and time.  Psychiatric:        Mood and Affect: Mood normal.        Behavior: Behavior normal.        Thought Content: Thought content normal.        Judgment: Judgment normal.     Labs reviewed: Basic Metabolic Panel: Recent Labs    10/27/21 1322  NA 140  K 3.8  CL 105  CO2 27  GLUCOSE 124*  BUN 11  CREATININE 0.80  CALCIUM 9.4   Liver Function Tests: No results for input(s): "AST", "ALT", "ALKPHOS", "BILITOT", "PROT", "ALBUMIN" in the last 8760 hours. No results for input(s): "LIPASE", "AMYLASE" in the last 8760 hours. No results for input(s): "AMMONIA" in the last 8760 hours. CBC: Recent Labs    10/27/21 1322  WBC 6.1  HGB 13.5  HCT 42.4  MCV 84.6  PLT 263   Lipid Panel: No results for input(s): "CHOL", "HDL", "LDLCALC", "TRIG", "CHOLHDL", "LDLDIRECT" in the last 8760 hours. No results found for: "HGBA1C"  Procedures since last visit: No results found.  Assessment/Plan There are no diagnoses linked to this encounter.   Labs/tests ordered:  * No order type specified * Next appt:  Visit date not found

## 2022-07-12 NOTE — Patient Instructions (Signed)
Preventive Care 65 Years and Older, Female Preventive care refers to lifestyle choices and visits with your health care provider that can promote health and wellness. Preventive care visits are also called wellness exams. What can I expect for my preventive care visit? Counseling Your health care provider may ask you questions about your: Medical history, including: Past medical problems. Family medical history. Pregnancy and menstrual history. History of falls. Current health, including: Memory and ability to understand (cognition). Emotional well-being. Home life and relationship well-being. Sexual activity and sexual health. Lifestyle, including: Alcohol, nicotine or tobacco, and drug use. Access to firearms. Diet, exercise, and sleep habits. Work and work environment. Sunscreen use. Safety issues such as seatbelt and bike helmet use. Physical exam Your health care provider will check your: Height and weight. These may be used to calculate your BMI (body mass index). BMI is a measurement that tells if you are at a healthy weight. Waist circumference. This measures the distance around your waistline. This measurement also tells if you are at a healthy weight and may help predict your risk of certain diseases, such as type 2 diabetes and high blood pressure. Heart rate and blood pressure. Body temperature. Skin for abnormal spots. What immunizations do I need?  Vaccines are usually given at various ages, according to a schedule. Your health care provider will recommend vaccines for you based on your age, medical history, and lifestyle or other factors, such as travel or where you work. What tests do I need? Screening Your health care provider may recommend screening tests for certain conditions. This may include: Lipid and cholesterol levels. Hepatitis C test. Hepatitis B test. HIV (human immunodeficiency virus) test. STI (sexually transmitted infection) testing, if you are at  risk. Lung cancer screening. Colorectal cancer screening. Diabetes screening. This is done by checking your blood sugar (glucose) after you have not eaten for a while (fasting). Mammogram. Talk with your health care provider about how often you should have regular mammograms. BRCA-related cancer screening. This may be done if you have a family history of breast, ovarian, tubal, or peritoneal cancers. Bone density scan. This is done to screen for osteoporosis. Talk with your health care provider about your test results, treatment options, and if necessary, the need for more tests. Follow these instructions at home: Eating and drinking  Eat a diet that includes fresh fruits and vegetables, whole grains, lean protein, and low-fat dairy products. Limit your intake of foods with high amounts of sugar, saturated fats, and salt. Take vitamin and mineral supplements as recommended by your health care provider. Do not drink alcohol if your health care provider tells you not to drink. If you drink alcohol: Limit how much you have to 0-1 drink a day. Know how much alcohol is in your drink. In the [place] one drink equals one 12 oz bottle of beer (355 mL), one 5 oz glass of wine (148 mL), or one 1 oz glass of hard liquor (44 mL). Lifestyle Brush your teeth every morning and night with fluoride toothpaste. Floss one time each day. Exercise for at least 30 minutes 5 or more days each week. Do not use any products that contain nicotine or tobacco. These products include cigarettes, chewing tobacco, and vaping devices, such as e-cigarettes. If you need help quitting, ask your health care provider. Do not use drugs. If you are sexually active, practice safe sex. Use a condom or other form of protection in order to prevent STIs. Take aspirin only as told by   your health care provider. Make sure that you understand how much to take and what form to take. Work with your health care provider to find out whether it  is safe and beneficial for you to take aspirin daily. Ask your health care provider if you need to take a cholesterol-lowering medicine (statin). Find healthy ways to manage stress, such as: Meditation, yoga, or listening to music. Journaling. Talking to a trusted person. Spending time with friends and family. Minimize exposure to UV radiation to reduce your risk of skin cancer. Safety Always wear your seat belt while driving or riding in a vehicle. Do not drive: If you have been drinking alcohol. Do not ride with someone who has been drinking. When you are tired or distracted. While texting. If you have been using any mind-altering substances or drugs. Wear a helmet and other protective equipment during sports activities. If you have firearms in your house, make sure you follow all gun safety procedures. What's next? Visit your health care provider once a year for an annual wellness visit. Ask your health care provider how often you should have your eyes and teeth checked. Stay up to date on all vaccines. This information is not intended to replace advice given to you by your health care provider. Make sure you discuss any questions you have with your health care provider. Document Revised: 05/26/2021 Document Reviewed: 05/26/2021 Elsevier Patient Education  2023 Elsevier Inc.  

## 2022-07-12 NOTE — Progress Notes (Incomplete)
[facility] clinic  Provider:   Kenard Gower  DNP  Code Status:  Full Code  Goals of Care:     07/12/2022    1:32 PM  Advanced Directives  Does Patient Have a Medical Advance Directive? No  Would patient like information on creating a medical advance directive? No - Patient declined     Chief Complaint  Patient presents with  . Establish Care    Patient is here to establish care, PT does have Pill bottles at initial appt. PT also having ringing in the ears. Elevated bp even with medication BID    HPI: Patient is a 81 y.o. female seen today for an to establish care with [facility]. She came today with son [name]. She stated that she and her son has not eaten breakfast and lunch because they are both in a hurry. 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 She has a daughter who lives in [place].  She has not taken her medications today. BP 168/82. She denies dizziness or headache. Discussed that she should take her medications to control her BP. She complains of buzzing on her right ear for a week now. She was prescribed Neomycin ear drops and has stopped using it because she thinks it is not helping her. She denies fever nor chills. She said that her stepson "Bruce has stage 4 cancer and is dying".  She was noted to have plantar warts under right breast.   Past Medical History:  Diagnosis Date  . Allergic rhinitis   . Anxiety   . Arthritis    right shoulder  . Congenital defect    left arm  . Depression   . GERD (gastroesophageal reflux disease)   . Headache   . Hyperlipemia   . Hypertension   . IBS (irritable bowel syndrome)   . Insomnia   . Memory loss   . Vitamin D deficiency   . Wears glasses     Past Surgical History:  Procedure Laterality Date  . ABDOMINAL HYSTERECTOMY    . CATARACT EXTRACTION Bilateral   . NECK SURGERY    . REVERSE SHOULDER ARTHROPLASTY Right 09/09/2019   Procedure: REVERSE SHOULDER ARTHROPLASTY;  Surgeon: Ernest Mallick, MD;  Location: [facility] OR;  Service: Orthopedics;  Laterality: Right;     Allergies  Allergen Reactions  . Norvasc [Amlodipine] Swelling    Pt not aware of   . Benicar [Olmesartan] Other (See Comments)    Nervousness Pt not aware of   . Lisinopril Cough    Pt not aware of     Outpatient Encounter Medications as of 07/12/2022  Medication Sig  . cyclobenzaprine (FLEXERIL) 5 MG tablet TAKE 1 TABLET BY MOUTH EVERY 8 HOURS AS NEEDED FOR MUSCLE SPASM  . metoprolol succinate (TOPROL-XL) 50 MG 24 hr tablet Take 75 mg by mouth 2 (two) times daily. Take with or immediately following a meal.  "takes 1.5 of the pills."  . neomycin-polymyxin-hydrocortisone (CORTISPORIN) OTIC solution 4 drops 3 (three) times daily. To be taken for 7 days  . [DISCONTINUED] diphenhydramine-acetaminophen (TYLENOL PM) 25-500 MG TABS tablet Take 3 tablets by mouth at bedtime.  . [DISCONTINUED] DULoxetine (CYMBALTA) 60 MG capsule Take 1 capsule (60 mg total) by mouth daily.   No facility-administered encounter medications on file as of 07/12/2022.    Review of Systems:  Review of Systems  Constitutional:  Negative for appetite change, chills, fatigue and fever.  HENT:  Negative for  congestion, hearing loss, rhinorrhea and sore throat.   Eyes: Negative.   Respiratory:  Negative for cough, shortness of breath and wheezing.   Cardiovascular:  Negative for chest pain, palpitations and leg swelling.  Gastrointestinal:  Negative for abdominal pain, constipation, diarrhea, nausea and vomiting.  Genitourinary:  Negative for dysuria.  Musculoskeletal:  Negative for arthralgias, back pain and myalgias.  Skin:  Negative for color change, rash and wound.       Wart   Neurological:  Negative for dizziness, weakness and headaches.  Psychiatric/Behavioral:  Negative for behavioral problems. The patient is not nervous/anxious.     Health Maintenance  Topic Date Due  . Zoster Vaccines- Shingrix (1 of 2) Never done  .  Pneumonia Vaccine 88+ Years old (1 - PCV) Never done  . DEXA SCAN  Never done  . COVID-19 Vaccine (2 - Pfizer series) 04/06/2020  . INFLUENZA VACCINE  07/12/2022  . TETANUS/TDAP  03/07/2032  . HPV VACCINES  Aged Out    Physical Exam: Vitals:   07/12/22 1350  BP: (!) 168/82  Pulse: 77  Resp: 18  Temp: (!) 97.3 F (36.3 C)  TempSrc: Temporal  SpO2: 99%  Weight: 133 lb (60.3 kg)  Height: 5\' 3"  (1.6 m)   Body mass index is 23.56 kg/m. Physical Exam Constitutional:      General: She is not in acute distress. HENT:     Head: Normocephalic and atraumatic.     Nose: Nose normal.     Mouth/Throat:     Mouth: Mucous membranes are moist.  Eyes:     Conjunctiva/sclera: Conjunctivae normal.  Cardiovascular:     Rate and Rhythm: Normal rate and regular rhythm.  Pulmonary:     Effort: Pulmonary effort is normal.     Breath sounds: Normal breath sounds.  Abdominal:     General: Bowel sounds are normal.     Palpations: Abdomen is soft.  Musculoskeletal:        General: Normal range of motion.     Cervical back: Normal range of motion.  Skin:    General: Skin is warm and dry.     Comments: Plantar warts under right breast   Neurological:     General: No focal deficit present.     Mental Status: She is alert and oriented to person, place, and time.  Psychiatric:        Mood and Affect: Mood normal.        Behavior: Behavior normal.        Thought Content: Thought content normal.        Judgment: Judgment normal.     Labs reviewed: Basic Metabolic Panel: Recent Labs    10/27/21 1322  NA 140  K 3.8  CL 105  CO2 27  GLUCOSE 124*  BUN 11  CREATININE 0.80  CALCIUM 9.4   Liver Function Tests: No results for input(s): "AST", "ALT", "ALKPHOS", "BILITOT", "PROT", "ALBUMIN" in the last 8760 hours. No results for input(s): "LIPASE", "AMYLASE" in the last 8760 hours. No results for input(s): "AMMONIA" in the last 8760 hours. CBC: Recent Labs    10/27/21 1322  WBC 6.1   HGB 13.5  HCT 42.4  MCV 84.6  PLT 263   Lipid Panel: No results for input(s): "CHOL", "HDL", "LDLCALC", "TRIG", "CHOLHDL", "LDLDIRECT" in the last 8760 hours. No results found for: "HGBA1C"  Procedures since last visit: No results found.  Assessment/Plan There are no diagnoses linked to this encounter.   Labs/tests ordered:  *  No order type specified * Next appt:  Visit date not found

## 2022-07-13 ENCOUNTER — Telehealth: Payer: Self-pay

## 2022-07-13 LAB — COMPLETE METABOLIC PANEL WITH GFR
AG Ratio: 2.1 (calc) (ref 1.0–2.5)
ALT: 9 U/L (ref 6–29)
AST: 16 U/L (ref 10–35)
Albumin: 4.2 g/dL (ref 3.6–5.1)
Alkaline phosphatase (APISO): 58 U/L (ref 37–153)
BUN: 13 mg/dL (ref 7–25)
CO2: 24 mmol/L (ref 20–32)
Calcium: 9.7 mg/dL (ref 8.6–10.4)
Chloride: 104 mmol/L (ref 98–110)
Creat: 0.71 mg/dL (ref 0.60–0.95)
Globulin: 2 g/dL (calc) (ref 1.9–3.7)
Glucose, Bld: 84 mg/dL (ref 65–99)
Potassium: 3.8 mmol/L (ref 3.5–5.3)
Sodium: 141 mmol/L (ref 135–146)
Total Bilirubin: 0.6 mg/dL (ref 0.2–1.2)
Total Protein: 6.2 g/dL (ref 6.1–8.1)
eGFR: 85 mL/min/{1.73_m2} (ref >=60)

## 2022-07-13 LAB — CBC WITH DIFFERENTIAL/PLATELET
Absolute Monocytes: 518 cells/uL (ref 200–950)
Basophils Absolute: 29 cells/uL (ref 0–200)
Basophils Relative: 0.4 %
Eosinophils Absolute: 58 cells/uL (ref 15–500)
Eosinophils Relative: 0.8 %
HCT: 41.5 % (ref 35.0–45.0)
Hemoglobin: 13.8 g/dL (ref 11.7–15.5)
Lymphs Abs: 1650 cells/uL (ref 850–3900)
MCH: 28.8 pg (ref 27.0–33.0)
MCHC: 33.3 g/dL (ref 32.0–36.0)
MCV: 86.5 fL (ref 80.0–100.0)
MPV: 11.6 fL (ref 7.5–12.5)
Monocytes Relative: 7.1 %
Neutro Abs: 5044 cells/uL (ref 1500–7800)
Neutrophils Relative %: 69.1 %
Platelets: 291 10*3/uL (ref 140–400)
RBC: 4.8 10*6/uL (ref 3.80–5.10)
RDW: 14.5 % (ref 11.0–15.0)
Total Lymphocyte: 22.6 %
WBC: 7.3 10*3/uL (ref 3.8–10.8)

## 2022-07-13 LAB — LIPID PANEL
Cholesterol: 267 mg/dL — ABNORMAL HIGH (ref <200)
HDL: 78 mg/dL (ref >=50)
LDL Cholesterol (Calc): 159 mg/dL (calc) — ABNORMAL HIGH
Non-HDL Cholesterol (Calc): 189 mg/dL (calc) — ABNORMAL HIGH (ref <130)
Total CHOL/HDL Ratio: 3.4 (calc) (ref <5.0)
Triglycerides: 163 mg/dL — ABNORMAL HIGH (ref <150)

## 2022-07-13 LAB — HEPATITIS B SURFACE ANTIBODY,QUALITATIVE: Hep B S Ab: NONREACTIVE

## 2022-07-13 NOTE — Progress Notes (Signed)
Cholesterol and triglycerides elevated. Eat more of vegetables and avoid fatty foods. Try to minimize if not avoid soda. Add water to juice to minimize sugar content. Drink more water and will need to repeat lipid panel in 3 months.

## 2022-07-13 NOTE — Telephone Encounter (Signed)
Patient called to check the status of her dermatology referral.  I provided patient with the contact information on her referral

## 2022-07-13 NOTE — Progress Notes (Signed)
CBC and CMP within normal. Hep B is nonreactive which means she does not have Hep B.

## 2022-07-21 ENCOUNTER — Encounter: Payer: Self-pay | Admitting: Adult Health

## 2022-07-21 ENCOUNTER — Ambulatory Visit (INDEPENDENT_AMBULATORY_CARE_PROVIDER_SITE_OTHER): Payer: Medicare Other | Admitting: Adult Health

## 2022-07-21 VITALS — BP 152/94 | HR 77 | Temp 96.9°F | Ht 63.0 in | Wt 130.6 lb

## 2022-07-21 DIAGNOSIS — I10 Essential (primary) hypertension: Secondary | ICD-10-CM

## 2022-07-21 DIAGNOSIS — H6690 Otitis media, unspecified, unspecified ear: Secondary | ICD-10-CM | POA: Diagnosis not present

## 2022-07-21 DIAGNOSIS — E782 Mixed hyperlipidemia: Secondary | ICD-10-CM

## 2022-07-21 DIAGNOSIS — G629 Polyneuropathy, unspecified: Secondary | ICD-10-CM

## 2022-07-21 MED ORDER — GABAPENTIN 100 MG PO CAPS: 100.0000 mg | ORAL_CAPSULE | Freq: Every day | ORAL | 3 refills | Status: DC

## 2022-07-21 NOTE — Progress Notes (Signed)
[facility] clinic  Provider:   Kenard Gower DNP  Code Status:   Full Code   Goals of Care:     07/12/2022    1:32 PM  Advanced Directives  Does Patient Have a Medical Advance Directive? No  Would patient like information on creating a medical advance directive? No - Patient declined     Chief Complaint  Patient presents with   Acute Visit    Complains of Numbness in Feet for about a week. Right is worse than Left    HPI: Patient is a 81 y.o. [name] seen today for an acute visit for numbness on both feet. She has a PMH of anxiety, GERD, memory loss, IBS, HTN and congenital defect (no left hand). She came to the clinic today accompanied by her daughter Jan. She complains of bilateral lower extremity tingling, numbness and burning, R>L.   Discussed elevated cholesterol and triglyceride level. Discussed her diet and exercise.  BP today 152/94, takes Metoprolol succinate 50 mg BID.  She continues to take Augmentin for her otitis media. She still complains about ringing in both ears.   Past Medical History:  Diagnosis Date   Allergic rhinitis    Anxiety    Arthritis    right shoulder   Congenital defect    left arm   Depression    GERD (gastroesophageal reflux disease)    Headache    Hyperlipemia    Hypertension    IBS (irritable bowel syndrome)    Insomnia    Memory loss    Vitamin D deficiency    Wears glasses     Past Surgical History:  Procedure Laterality Date   ABDOMINAL HYSTERECTOMY     CATARACT EXTRACTION Bilateral    NECK SURGERY     REVERSE SHOULDER ARTHROPLASTY Right 09/09/2019   Procedure: REVERSE SHOULDER ARTHROPLASTY;  Surgeon: Ernest Mallick, MD;  Location: [facility] OR;  Service: Orthopedics;  Laterality: Right;     Allergies  Allergen Reactions   Norvasc [Amlodipine] Swelling    Pt not aware of    Benicar [Olmesartan] Other (See Comments)    Nervousness Pt not aware of    Lisinopril Cough    Pt not aware of     Outpatient  Encounter Medications as of 07/21/2022  Medication Sig   acetaminophen (TYLENOL) 500 MG tablet Take 1,000 mg by mouth 3 (three) times daily.   amoxicillin-clavulanate (AUGMENTIN) 875-125 MG tablet Take 1 tablet by mouth 2 (two) times daily for 10 days.   cyclobenzaprine (FLEXERIL) 5 MG tablet TAKE 1 TABLET BY MOUTH EVERY 8 HOURS AS NEEDED FOR MUSCLE SPASM   gabapentin (NEURONTIN) 100 MG capsule Take 1 capsule (100 mg total) by mouth at bedtime.   metoprolol succinate (TOPROL-XL) 50 MG 24 hr tablet Take 75 mg by mouth 2 (two) times daily. Take with or immediately following a meal.  "takes 1.5 of the pills."   No facility-administered encounter medications on file as of 07/21/2022.    Review of Systems:  Review of Systems  Constitutional:  Negative for appetite change, chills, fatigue and fever.  HENT:  Negative for congestion, hearing loss, rhinorrhea and sore throat.        Ringing in both ears  Eyes: Negative.   Respiratory:  Negative for cough, shortness of breath and wheezing.   Cardiovascular:  Negative for chest pain, palpitations and leg swelling.  Gastrointestinal:  Negative for abdominal pain, constipation, diarrhea, nausea and vomiting.  Genitourinary:  Negative for dysuria.  Musculoskeletal:  Negative for arthralgias, back pain and myalgias.  Skin:  Negative for color change, rash and wound.  Neurological:  Negative for dizziness, weakness and headaches.  Psychiatric/Behavioral:  Negative for behavioral problems. The patient is not nervous/anxious.     Health Maintenance  Topic Date Due   Zoster Vaccines- Shingrix (1 of 2) Never done   Pneumonia Vaccine 71+ Years old (1 - PCV) Never done   DEXA SCAN  Never done   COVID-19 Vaccine (2 - Pfizer series) 04/Elizabeth/2021   INFLUENZA VACCINE  07/12/2022   TETANUS/TDAP  03/07/2032   HPV VACCINES  Aged Out    Physical Exam: Vitals:   07/21/22 1033  BP: (!) 152/94  Pulse: 77  Temp: (!) 96.9 F (36.1 C)  TempSrc: Skin  SpO2: 96%   Weight: 130 lb 9.6 oz (59.2 kg)  Height: 5\' 3"  (1.6 m)   Body mass index is 23.13 kg/m. Physical Exam Constitutional:      Appearance: Normal appearance.  HENT:     Head: Normocephalic and atraumatic.     Nose: Nose normal.     Mouth/Throat:     Mouth: Mucous membranes are moist.  Eyes:     Conjunctiva/sclera: Conjunctivae normal.  Cardiovascular:     Rate and Rhythm: Normal rate and regular rhythm.  Pulmonary:     Effort: Pulmonary effort is normal.     Breath sounds: Normal breath sounds.  Abdominal:     General: Bowel sounds are normal.     Palpations: Abdomen is soft.  Musculoskeletal:        General: Normal range of motion.     Cervical back: Normal range of motion.  Skin:    General: Skin is warm and dry.     Comments: Has plantar warts under right breast.  Neurological:     General: No focal deficit present.     Mental Status: She is alert and oriented to person, place, and time.  Psychiatric:        Mood and Affect: Mood normal.        Behavior: Behavior normal.        Thought Content: Thought content normal.        Judgment: Judgment normal.     Labs reviewed: Basic Metabolic Panel: Recent Labs    10/27/21 1322 07/12/22 1440  NA 140 141  K 3.8 3.8  CL 105 104  CO2 27 24  GLUCOSE 124* 84  BUN 11 13  CREATININE 0.80 0.71  CALCIUM 9.4 9.7   Liver Function Tests: Recent Labs    07/12/22 1440  AST 16  ALT 9  BILITOT 0.6  PROT 6.2   No results for input(s): "LIPASE", "AMYLASE" in the last 8760 hours. No results for input(s): "AMMONIA" in the last 8760 hours. CBC: Recent Labs    10/27/21 1322 07/12/22 1440  WBC 6.1 7.3  NEUTROABS  --  5,044  HGB 13.5 13.8  HCT 42.4 41.5  MCV 84.6 86.5  PLT 263 291   Lipid Panel: Recent Labs    07/12/22 1440  CHOL 267*  HDL 78  LDLCALC 159*  TRIG 163*  CHOLHDL 3.4   No results found for: "HGBA1C"  Procedures since last visit: No results found.  Assessment/Plan  1. Neuropathy - will  start on Nerontin - gabapentin (NEURONTIN) 100 MG capsule; Take 1 capsule (100 mg total) by mouth at bedtime.  Dispense: 30 capsule; Refill: 3  2. Mixed hyperlipidemia Lab Results  Component Value Date  CHOL 267 (H) 07/12/2022   HDL 78 07/12/2022   LDLCALC 159 (H) 07/12/2022   TRIG 163 (H) 07/12/2022   CHOLHDL 3.4 07/12/2022   -  instructed to eat healthy diet and exercise -   will re-evaluate in 3 months - Lipid panel; Future  3. Acute otitis media, unspecified otitis media type -  continue to take Augmentin  4. Primary hypertension -  discussed importance of taking medications regularly and checking and logging BP readings -  continue Metoprolol succinate    Labs/tests ordered:  lipid panel in 3 months  Next appt:  recommend in 3 months

## 2022-07-21 NOTE — Patient Instructions (Signed)
Neuropathic Pain Neuropathic pain is pain caused by damage to the nerves that are responsible for certain sensations in your body (sensory nerves). Neuropathic pain can make you more sensitive to pain. Even a minor sensation can feel very painful. This is usually a long-term (chronic) condition that can be difficult to treat. The type of pain differs from person to person. It may: Start suddenly (acute), or it may develop slowly and become chronic. Come and go as damaged nerves heal, or it may stay at the same level for years. Cause emotional distress, loss of sleep, and a lower quality of life. What are the causes? The most common cause of this condition is diabetes. Many other diseases and conditions can also cause neuropathic pain. Causes of neuropathic pain can be classified as: Toxic. This is caused by medicines and chemicals. The most common causes of toxic neuropathic pain is damage from medicines that kill cancer cells (chemotherapy) or alcohol abuse. Metabolic. This can be caused by: Diabetes. Lack of vitamins like B12. Traumatic. Any injury that cuts, crushes, or stretches a nerve can cause damage and pain. Compression-related. If a sensory nerve gets trapped or compressed for a long period of time, the blood supply to the nerve can be cut off. Vascular. Many blood vessel diseases can cause neuropathic pain by decreasing blood supply and oxygen to nerves. Autoimmune. This type of pain results from diseases in which the body's defense system (immune system) mistakenly attacks sensory nerves. Examples of autoimmune diseases that can cause neuropathic pain include lupus and multiple sclerosis. Infectious. Many types of viral infections can damage sensory nerves and cause pain. Shingles infection is a common cause of this type of pain. Inherited. Neuropathic pain can be a symptom of many diseases that are passed down through families (genetic). What increases the risk? You are more likely to  develop this condition if: You have diabetes. You smoke. You drink too much alcohol. You are taking certain medicines, including chemotherapy or medicines that treat immune system disorders. What are the signs or symptoms? The main symptom is pain. Neuropathic pain is often described as: Burning. Shock-like. Stinging. Hot or cold. Itching. How is this diagnosed? No single test can diagnose neuropathic pain. It is diagnosed based on: A physical exam and your symptoms. Your health care provider will ask you about your pain. You may be asked to use a pain scale to describe how bad your pain is. Tests. These may be done to see if you have a cause and location of any nerve damage. They include: Nerve conduction studies and electromyography to test how well nerve signals travel through your nerves and muscles (electrodiagnostic testing). Skin biopsy to evaluate for small fiber neuropathy. Imaging studies, such as: X-rays. CT scan. MRI. How is this treated? Treatment for neuropathic pain may change over time. You may need to try different treatment options or a combination of treatments. Some options include: Treating the underlying cause of the neuropathy, such as diabetes, kidney disease, or vitamin deficiencies. Stopping medicines that can cause neuropathy, such as chemotherapy. Medicine to relieve pain. Medicines may include: Prescription or over-the-counter pain medicine. Anti-seizure medicine. Antidepressant medicines. Pain-relieving patches or creams that are applied to painful areas of skin. A medicine to numb the area (local anesthetic), which can be injected as a nerve block. Transcutaneous nerve stimulation. This uses electrical currents to block painful nerve signals. The treatment is painless. Alternative treatments, such as: Acupuncture. Meditation. Massage. Occupational or physical therapy. Pain management programs. Counseling. Follow   these instructions at  home: Medicines  Take over-the-counter and prescription medicines only as told by your health care provider. Ask your health care provider if the medicine prescribed to you: Requires you to avoid driving or using machinery. Can cause constipation. You may need to take these actions to prevent or treat constipation: Drink enough fluid to keep your urine pale yellow. Take over-the-counter or prescription medicines. Eat foods that are high in fiber, such as beans, whole grains, and fresh fruits and vegetables. Limit foods that are high in fat and processed sugars, such as fried or sweet foods. Lifestyle  Have a good support system at home. Consider joining a chronic pain support group. Do not use any products that contain nicotine or tobacco. These products include cigarettes, chewing tobacco, and vaping devices, such as e-cigarettes. If you need help quitting, ask your health care provider. Do not drink alcohol. General instructions Learn as much as you can about your condition. Work closely with all your health care providers to find the treatment plan that works best for you. Ask your health care provider what activities are safe for you. Keep all follow-up visits. This is important. Contact a health care provider if: Your pain treatments are not working. You are having side effects from your medicines. You are struggling with tiredness (fatigue), mood changes, depression, or anxiety. Get help right away if: You have thoughts of hurting yourself. Get help right away if you feel like you may hurt yourself or others, or have thoughts about taking your own life. Go to your nearest emergency room or: Call 911. Call the National Suicide Prevention Lifeline at 1-800-273-8255 or 988. This is open 24 hours a day. Text the Crisis Text Line at 741741. Summary Neuropathic pain is pain caused by damage to the nerves that are responsible for certain sensations in your body (sensory  nerves). Neuropathic pain may come and go as damaged nerves heal, or it may stay at the same level for years. Neuropathic pain is usually a long-term condition that can be difficult to treat. Consider joining a chronic pain support group. This information is not intended to replace advice given to you by your health care provider. Make sure you discuss any questions you have with your health care provider. Document Revised: 07/26/2021 Document Reviewed: 07/26/2021 Elsevier Patient Education  2023 [employer]vier Inc.  

## 2022-08-08 ENCOUNTER — Telehealth: Payer: Self-pay

## 2022-08-08 NOTE — Telephone Encounter (Signed)
Patient called and stated that gabapentin is not working. Tried calling patient to see exactly was going on, but there was no answer.  Message routed to Kenard Gower, NP

## 2022-08-09 NOTE — Telephone Encounter (Signed)
LMOM for patient to return call.

## 2022-08-17 ENCOUNTER — Other Ambulatory Visit: Payer: Self-pay | Admitting: Adult Health

## 2022-08-17 DIAGNOSIS — Z1231 Encounter for screening mammogram for malignant neoplasm of breast: Secondary | ICD-10-CM

## 2022-08-19 ENCOUNTER — Ambulatory Visit (INDEPENDENT_AMBULATORY_CARE_PROVIDER_SITE_OTHER): Payer: Medicare Other | Admitting: Adult Health

## 2022-08-19 ENCOUNTER — Encounter: Payer: Self-pay | Admitting: Adult Health

## 2022-08-19 VITALS — BP 134/70 | HR 83 | Temp 97.3°F | Resp 18 | Ht 63.0 in | Wt 132.0 lb

## 2022-08-19 DIAGNOSIS — I10 Essential (primary) hypertension: Secondary | ICD-10-CM | POA: Diagnosis not present

## 2022-08-19 DIAGNOSIS — F5101 Primary insomnia: Secondary | ICD-10-CM | POA: Diagnosis not present

## 2022-08-19 DIAGNOSIS — G629 Polyneuropathy, unspecified: Secondary | ICD-10-CM | POA: Diagnosis not present

## 2022-08-19 MED ORDER — MELATONIN 3 MG PO TABS: 3.0000 mg | ORAL_TABLET | Freq: Every day | ORAL | 3 refills | Status: DC

## 2022-08-19 MED ORDER — GABAPENTIN 100 MG PO CAPS: 200.0000 mg | ORAL_CAPSULE | Freq: Every day | ORAL | 3 refills | Status: DC

## 2022-08-19 NOTE — Progress Notes (Signed)
[facility] clinic  Provider:   Kenard Gower DNP  Code Status:  Full Code  Goals of Care:     07/12/2022    1:32 PM  Advanced Directives  Does Patient Have a Medical Advance Directive? No  Would patient like information on creating a medical advance directive? No - Patient declined     Chief Complaint  Patient presents with   Acute Visit    Patient is here for pain management     HPI: Patient is a 81 y.o. female seen today for an acute visit for pain management. She has PMH of congenital absence of left hand, allergic rhinitis, anxiety, arthritis, depression, GERD, IBS and memory loss.  She presented to the office, accompanied by her daughter, and complained of pain on bilateral legs causing difficulty sleeping. She was recently started on Gabapentin 100 mg at bedtime for neuropathy.    Past Medical History:  Diagnosis Date   Allergic rhinitis    Anxiety    Arthritis    right shoulder   Congenital defect    left arm   Depression    GERD (gastroesophageal reflux disease)    Headache    Hyperlipemia    Hypertension    IBS (irritable bowel syndrome)    Insomnia    Memory loss    Vitamin D deficiency    Wears glasses     Past Surgical History:  Procedure Laterality Date   ABDOMINAL HYSTERECTOMY     CATARACT EXTRACTION Bilateral    NECK SURGERY     REVERSE SHOULDER ARTHROPLASTY Right 09/09/2019   Procedure: REVERSE SHOULDER ARTHROPLASTY;  Surgeon: Ernest Mallick, MD;  Location: [facility] OR;  Service: Orthopedics;  Laterality: Right;     Allergies  Allergen Reactions   Norvasc [Amlodipine] Swelling    Pt not aware of    Benicar [Olmesartan] Other (See Comments)    Nervousness Pt not aware of    Lisinopril Cough    Pt not aware of     Outpatient Encounter Medications as of 08/19/2022  Medication Sig   acetaminophen (TYLENOL) 500 MG tablet Take 1,000 mg by mouth 3 (three) times daily.   cyclobenzaprine (FLEXERIL) 5 MG tablet TAKE 1 TABLET BY MOUTH  EVERY 8 HOURS AS NEEDED FOR MUSCLE SPASM   metoprolol succinate (TOPROL-XL) 50 MG 24 hr tablet Take 75 mg by mouth 2 (two) times daily. Take with or immediately following a meal.  "takes 1.5 of the pills."   [DISCONTINUED] gabapentin (NEURONTIN) 100 MG capsule Take 1 capsule (100 mg total) by mouth at bedtime.   [DISCONTINUED] melatonin 3 MG TABS tablet Take 1 tablet (3 mg total) by mouth at bedtime.   gabapentin (NEURONTIN) 100 MG capsule Take 2 capsules (200 mg total) by mouth at bedtime.   melatonin 3 MG TABS tablet Take 1 tablet (3 mg total) by mouth at bedtime.   No facility-administered encounter medications on file as of 08/19/2022.    Review of Systems:  Review of Systems  Constitutional:  Negative for appetite change, chills, fatigue and fever.  HENT:  Negative for congestion, hearing loss, rhinorrhea and sore throat.   Eyes: Negative.   Respiratory:  Negative for cough, shortness of breath and wheezing.   Cardiovascular:  Negative for chest pain, palpitations and leg swelling.  Gastrointestinal:  Negative for abdominal pain, constipation, diarrhea, nausea and vomiting.  Genitourinary:  Negative for dysuria.  Musculoskeletal:  Negative for arthralgias, back pain and myalgias.  Skin:  Negative for  color change, rash and wound.  Neurological:  Positive for numbness. Negative for dizziness, tremors, weakness and headaches.       Numbess, pain on BLE  Psychiatric/Behavioral:  Negative for behavioral problems. The patient is not nervous/anxious.     Health Maintenance  Topic Date Due   COVID-19 Vaccine (1) Never done   Zoster Vaccines- Shingrix (1 of 2) Never done   Pneumonia Vaccine 25+ Years old (1 - PCV) Never done   DEXA SCAN  Never done   INFLUENZA VACCINE  07/12/2022   TETANUS/TDAP  03/07/2032   HPV VACCINES  Aged Out    Physical Exam: Vitals:   08/19/22 1500  BP: 134/70  Pulse: 83  Resp: 18  Temp: (!) 97.3 F (36.3 C)  SpO2: 98%  Weight: 132 lb (59.9 kg)   Height: 5\' 3"  (1.6 m)   Body mass index is 23.38 kg/m. Physical Exam Constitutional:      Appearance: Normal appearance.  HENT:     Head: Normocephalic and atraumatic.     Nose: Nose normal.     Mouth/Throat:     Mouth: Mucous membranes are moist.  Eyes:     Conjunctiva/sclera: Conjunctivae normal.  Cardiovascular:     Rate and Rhythm: Normal rate and regular rhythm.  Pulmonary:     Effort: Pulmonary effort is normal.     Breath sounds: Normal breath sounds.  Abdominal:     General: Bowel sounds are normal.     Palpations: Abdomen is soft.  Musculoskeletal:        General: Normal range of motion.     Cervical back: Normal range of motion.     Comments: Congenital absence of left hand.  Skin:    General: Skin is warm and dry.  Neurological:     General: No focal deficit present.     Mental Status: She is alert and oriented to person, place, and time.  Psychiatric:        Mood and Affect: Mood normal.        Behavior: Behavior normal.        Thought Content: Thought content normal.        Judgment: Judgment normal.     Labs reviewed: Basic Metabolic Panel: Recent Labs    10/27/21 1322 07/12/22 1440  NA 140 141  K 3.8 3.8  CL 105 104  CO2 27 24  GLUCOSE 124* 84  BUN 11 13  CREATININE 0.80 0.71  CALCIUM 9.4 9.7   Liver Function Tests: Recent Labs    07/12/22 1440  AST 16  ALT 9  BILITOT 0.6  PROT 6.2   No results for input(s): "LIPASE", "AMYLASE" in the last 8760 hours. No results for input(s): "AMMONIA" in the last 8760 hours. CBC: Recent Labs    10/27/21 1322 07/12/22 1440  WBC 6.1 7.3  NEUTROABS  --  5,044  HGB 13.5 13.8  HCT 42.4 41.5  MCV 84.6 86.5  PLT 263 291   Lipid Panel: Recent Labs    07/12/22 1440  CHOL 267*  HDL 78  LDLCALC 159*  TRIG 163*  CHOLHDL 3.4   No results found for: "HGBA1C"  Procedures since last visit: No results found.  Assessment/Plan  1. Neuropathy -  will increase Gabapentin 100 mg 1 capsule Q HS  to 2 capsules = 200 mg Q HS - gabapentin (NEURONTIN) 100 MG capsule; Take 2 capsules (200 mg total) by mouth at bedtime.  Dispense: 60 capsule; Refill: 3  2. Primary  insomnia -  will start on Melatonin - melatonin 3 MG TABS tablet; Take 1 tablet (3 mg total) by mouth at bedtime.  Dispense: 30 tablet; Refill: 3  3.  Primary hypertension -  BP 134/70, improved -   continue Metoprolol succinate 50 mg take 1.5 tabs = 75 mg daily     Labs/tests ordered:  None  Next appt:  as needed

## 2022-08-19 NOTE — Patient Instructions (Signed)
Neuropathic Pain Neuropathic pain is pain caused by damage to the nerves that are responsible for certain sensations in your body (sensory nerves). Neuropathic pain can make you more sensitive to pain. Even a minor sensation can feel very painful. This is usually a long-term (chronic) condition that can be difficult to treat. The type of pain differs from person to person. It may: Start suddenly (acute), or it may develop slowly and become chronic. Come and go as damaged nerves heal, or it may stay at the same level for years. Cause emotional distress, loss of sleep, and a lower quality of life. What are the causes? The most common cause of this condition is diabetes. Many other diseases and conditions can also cause neuropathic pain. Causes of neuropathic pain can be classified as: Toxic. This is caused by medicines and chemicals. The most common causes of toxic neuropathic pain is damage from medicines that kill cancer cells (chemotherapy) or alcohol abuse. Metabolic. This can be caused by: Diabetes. Lack of vitamins like B12. Traumatic. Any injury that cuts, crushes, or stretches a nerve can cause damage and pain. Compression-related. If a sensory nerve gets trapped or compressed for a long period of time, the blood supply to the nerve can be cut off. Vascular. Many blood vessel diseases can cause neuropathic pain by decreasing blood supply and oxygen to nerves. Autoimmune. This type of pain results from diseases in which the body's defense system (immune system) mistakenly attacks sensory nerves. Examples of autoimmune diseases that can cause neuropathic pain include lupus and multiple sclerosis. Infectious. Many types of viral infections can damage sensory nerves and cause pain. Shingles infection is a common cause of this type of pain. Inherited. Neuropathic pain can be a symptom of many diseases that are passed down through families (genetic). What increases the risk? You are more likely to  develop this condition if: You have diabetes. You smoke. You drink too much alcohol. You are taking certain medicines, including chemotherapy or medicines that treat immune system disorders. What are the signs or symptoms? The main symptom is pain. Neuropathic pain is often described as: Burning. Shock-like. Stinging. Hot or cold. Itching. How is this diagnosed? No single test can diagnose neuropathic pain. It is diagnosed based on: A physical exam and your symptoms. Your health care provider will ask you about your pain. You may be asked to use a pain scale to describe how bad your pain is. Tests. These may be done to see if you have a cause and location of any nerve damage. They include: Nerve conduction studies and electromyography to test how well nerve signals travel through your nerves and muscles (electrodiagnostic testing). Skin biopsy to evaluate for small fiber neuropathy. Imaging studies, such as: X-rays. CT scan. MRI. How is this treated? Treatment for neuropathic pain may change over time. You may need to try different treatment options or a combination of treatments. Some options include: Treating the underlying cause of the neuropathy, such as diabetes, kidney disease, or vitamin deficiencies. Stopping medicines that can cause neuropathy, such as chemotherapy. Medicine to relieve pain. Medicines may include: Prescription or over-the-counter pain medicine. Anti-seizure medicine. Antidepressant medicines. Pain-relieving patches or creams that are applied to painful areas of skin. A medicine to numb the area (local anesthetic), which can be injected as a nerve block. Transcutaneous nerve stimulation. This uses electrical currents to block painful nerve signals. The treatment is painless. Alternative treatments, such as: Acupuncture. Meditation. Massage. Occupational or physical therapy. Pain management programs. Counseling. Follow   these instructions at  home: Medicines  Take over-the-counter and prescription medicines only as told by your health care provider. Ask your health care provider if the medicine prescribed to you: Requires you to avoid driving or using machinery. Can cause constipation. You may need to take these actions to prevent or treat constipation: Drink enough fluid to keep your urine pale yellow. Take over-the-counter or prescription medicines. Eat foods that are high in fiber, such as beans, whole grains, and fresh fruits and vegetables. Limit foods that are high in fat and processed sugars, such as fried or sweet foods. Lifestyle  Have a good support system at home. Consider joining a chronic pain support group. Do not use any products that contain nicotine or tobacco. These products include cigarettes, chewing tobacco, and vaping devices, such as e-cigarettes. If you need help quitting, ask your health care provider. Do not drink alcohol. General instructions Learn as much as you can about your condition. Work closely with all your health care providers to find the treatment plan that works best for you. Ask your health care provider what activities are safe for you. Keep all follow-up visits. This is important. Contact a health care provider if: Your pain treatments are not working. You are having side effects from your medicines. You are struggling with tiredness (fatigue), mood changes, depression, or anxiety. Get help right away if: You have thoughts of hurting yourself. Get help right away if you feel like you may hurt yourself or others, or have thoughts about taking your own life. Go to your nearest emergency room or: Call 911. Call the National Suicide Prevention Lifeline at 1-800-273-8255 or 988. This is open 24 hours a day. Text the Crisis Text Line at 741741. Summary Neuropathic pain is pain caused by damage to the nerves that are responsible for certain sensations in your body (sensory  nerves). Neuropathic pain may come and go as damaged nerves heal, or it may stay at the same level for years. Neuropathic pain is usually a long-term condition that can be difficult to treat. Consider joining a chronic pain support group. This information is not intended to replace advice given to you by your health care provider. Make sure you discuss any questions you have with your health care provider. Document Revised: 07/26/2021 Document Reviewed: 07/26/2021 Elsevier Patient Education  2023 [employer]vier Inc.  

## 2022-08-22 ENCOUNTER — Telehealth: Payer: Self-pay

## 2022-08-22 NOTE — Telephone Encounter (Signed)
1.) Patient called stating she was seen Friday and Monina was suppose to call something in for her ear.  2.) Patient also states her sciatic nerve pain was worse over the weekend and she needs a different treatment plan, other than ice and heat.

## 2022-08-24 NOTE — Telephone Encounter (Signed)
Message routed again to Kenard Gower, NP

## 2022-08-25 NOTE — Telephone Encounter (Signed)
Medina-Vargas, Monina C, NP  [facility] Clinical Pool 48 minutes ago (12:19 PM)     I have treated her for ear infection on her 1st visit, 07/21/22, and given her Augmentin. Can she come to the clinic for her ears to be checked?   Thanks.   Monina

## 2022-08-25 NOTE — Telephone Encounter (Signed)
Patient has been scheduled for tomorrow. She was unable to come today due to taking son to his doctor.

## 2022-08-25 NOTE — Telephone Encounter (Signed)
Pt was notified but states she is hearing things out of her ear and its been terrible. She said when she was here you told her that her ear was red. Please advise.

## 2022-08-25 NOTE — Telephone Encounter (Signed)
Thank you :)

## 2022-08-25 NOTE — Telephone Encounter (Signed)
Patient may need to be seen in office. Message routed to front administration. Can someone call patient and see if she can visit office today or tomorrow?

## 2022-08-26 ENCOUNTER — Other Ambulatory Visit: Payer: Self-pay | Admitting: Nurse Practitioner

## 2022-08-26 ENCOUNTER — Encounter: Payer: Self-pay | Admitting: Nurse Practitioner

## 2022-08-26 ENCOUNTER — Ambulatory Visit (INDEPENDENT_AMBULATORY_CARE_PROVIDER_SITE_OTHER): Payer: Medicare Other | Admitting: Nurse Practitioner

## 2022-08-26 VITALS — BP 166/86 | HR 78 | Temp 97.1°F | Ht 63.0 in | Wt 133.4 lb

## 2022-08-26 DIAGNOSIS — I10 Essential (primary) hypertension: Secondary | ICD-10-CM

## 2022-08-26 DIAGNOSIS — B372 Candidiasis of skin and nail: Secondary | ICD-10-CM

## 2022-08-26 DIAGNOSIS — H9313 Tinnitus, bilateral: Secondary | ICD-10-CM | POA: Diagnosis not present

## 2022-08-26 MED ORDER — LOSARTAN POTASSIUM 25 MG PO TABS: 25.0000 mg | ORAL_TABLET | Freq: Every day | ORAL | 1 refills | Status: DC

## 2022-08-26 NOTE — Progress Notes (Signed)
Careteam: Patient Care Team: Medina-Vargas, Margit Banda, NP as PCP - General (Internal Medicine)  PLACE OF SERVICE:  [facility]  Advanced Directive information Does Patient Have a Medical Advance Directive?: Yes, Type of Advance Directive: Healthcare Power of [place];Living will, Does patient want to make changes to medical advance directive?: No - Patient declined  Allergies  Allergen Reactions   Norvasc [Amlodipine] Swelling    Pt not aware of    Benicar [Olmesartan] Other (See Comments)    Nervousness Pt not aware of    Lisinopril Cough    Pt not aware of     Chief Complaint  Patient presents with   Acute Visit    Continued ear pain and intense noise. Discuss when to take metoprolol.      HPI: Patient is a 81 y.o. female due to hearing things.  Reports she is hearing everything. Reports she hears buzzing at night  It wakes her up at night.  This has been going on for about 6 months. Worse in right ear than left .  No pain but does not like the sound.   Blood pressure continues to be elevated. Taking metoprolol succinate twice daily.  No chest pains, shortness of breath. Will have occasional headache.   Review of Systems:  Review of Systems  Constitutional:  Negative for chills, fever and weight loss.  HENT:  Positive for tinnitus.   Respiratory:  Negative for cough, sputum production and shortness of breath.   Cardiovascular:  Negative for chest pain, palpitations and leg swelling.  Gastrointestinal:  Negative for abdominal pain, constipation, diarrhea and heartburn.  Genitourinary:  Negative for dysuria, frequency and urgency.  Musculoskeletal:  Negative for back pain, falls, joint pain and myalgias.  Skin: Negative.   Neurological:  Positive for headaches. Negative for dizziness.    Past Medical History:  Diagnosis Date   Allergic rhinitis    Anxiety    Arthritis    right shoulder   Congenital defect    left arm   Depression    GERD (gastroesophageal  reflux disease)    Headache    Hyperlipemia    Hypertension    IBS (irritable bowel syndrome)    Insomnia    Memory loss    Vitamin D deficiency    Wears glasses    Past Surgical History:  Procedure Laterality Date   ABDOMINAL HYSTERECTOMY     CATARACT EXTRACTION Bilateral    NECK SURGERY     REVERSE SHOULDER ARTHROPLASTY Right 09/09/2019   Procedure: REVERSE SHOULDER ARTHROPLASTY;  Surgeon: Ernest Mallick, MD;  Location: [facility] OR;  Service: Orthopedics;  Laterality: Right;    Social History:   reports that she has never smoked. She has never used smokeless tobacco. She reports that she does not drink alcohol and does not use drugs.  Family History  Adopted: Yes  Problem Relation Age of Onset   Breast cancer Mother    Other Father        unsure of history    Medications: Patient's Medications  New Prescriptions   No medications on file  Previous Medications   ACETAMINOPHEN (TYLENOL) 500 MG TABLET    Take 1,000 mg by mouth 3 (three) times daily.   CYCLOBENZAPRINE (FLEXERIL) 5 MG TABLET    TAKE 1 TABLET BY MOUTH EVERY 8 HOURS AS NEEDED FOR MUSCLE SPASM   GABAPENTIN (NEURONTIN) 100 MG CAPSULE    Take 2 capsules (200 mg total) by mouth at bedtime.  MELATONIN 3 MG TABS TABLET    Take 1 tablet (3 mg total) by mouth at bedtime.   METOPROLOL SUCCINATE (TOPROL-XL) 50 MG 24 HR TABLET    Take 75 mg by mouth 2 (two) times daily. Take with or immediately following a meal  Modified Medications   No medications on file  Discontinued Medications   No medications on file    Physical Exam:  Vitals:   08/26/22 1347  BP: (!) 166/86  Pulse: 78  Temp: (!) 97.1 F (36.2 C)  TempSrc: Oral  SpO2: 95%  Weight: 133 lb 6.4 oz (60.5 kg)  Height: 5\' 3"  (1.6 m)   Body mass index is 23.63 kg/m. Wt Readings from Last 3 Encounters:  08/26/22 133 lb 6.4 oz (60.5 kg)  08/19/22 132 lb (59.9 kg)  07/21/22 130 lb 9.6 oz (59.2 kg)    Physical Exam Constitutional:      General:  She is not in acute distress.    Appearance: She is well-developed. She is not diaphoretic.  HENT:     Head: Normocephalic and atraumatic.     Right Ear: Tympanic membrane, ear canal and external ear normal. There is no impacted cerumen.     Left Ear: Tympanic membrane, ear canal and external ear normal. There is no impacted cerumen.     Mouth/Throat:     Pharynx: No oropharyngeal exudate.  Eyes:     Conjunctiva/sclera: Conjunctivae normal.     Pupils: Pupils are equal, round, and reactive to light.  Cardiovascular:     Rate and Rhythm: Normal rate and regular rhythm.     Heart sounds: Normal heart sounds.  Pulmonary:     Effort: Pulmonary effort is normal.     Breath sounds: Normal breath sounds.  Abdominal:     General: Bowel sounds are normal.     Palpations: Abdomen is soft.  Musculoskeletal:     Cervical back: Normal range of motion and neck supple.     Right lower leg: No edema.     Left lower leg: No edema.  Skin:    General: Skin is warm and dry.  Neurological:     Mental Status: She is alert.  Psychiatric:        Mood and Affect: Mood normal.     Labs reviewed: Basic Metabolic Panel: Recent Labs    10/27/21 1322 07/12/22 1440  NA 140 141  K 3.8 3.8  CL 105 104  CO2 27 24  GLUCOSE 124* 84  BUN 11 13  CREATININE 0.80 0.71  CALCIUM 9.4 9.7   Liver Function Tests: Recent Labs    07/12/22 1440  AST 16  ALT 9  BILITOT 0.6  PROT 6.2   No results for input(s): "LIPASE", "AMYLASE" in the last 8760 hours. No results for input(s): "AMMONIA" in the last 8760 hours. CBC: Recent Labs    10/27/21 1322 07/12/22 1440  WBC 6.1 7.3  NEUTROABS  --  5,044  HGB 13.5 13.8  HCT 42.4 41.5  MCV 84.6 86.5  PLT 263 291   Lipid Panel: Recent Labs    07/12/22 1440  CHOL 267*  HDL 78  LDLCALC 159*  TRIG 163*  CHOLHDL 3.4   TSH: No results for input(s): "TSH" in the last 8760 hours. A1C: No results found for: "HGBA1C"   Assessment/Plan 1. Tinnitus of  both ear - Ambulatory referral to ENT  2. Primary hypertension -DASH diet recommended -she states she is taking metoprolol succinate twice daily- asked her  to bring medication bottles to next visit to confirm as this is generally dosed once daily -to add losartan daily  - losartan (COZAAR) 25 MG tablet; Take 1 tablet (25 mg total) by mouth daily.  Dispense: 30 tablet; Refill: 1  3. Yeast dermatitis -has used nystatin with good relief. Encouraged to keep area under breast clean and dry and to use nystatin powder as needed.   Follow up in 2 weeks with PCP for bp follow up Shanda Bumps K. Biagio Borg  [facility] [phone]

## 2022-08-26 NOTE — Patient Instructions (Addendum)
ADD losartan 25 mg daily (take in morning) for blood pressure Continue 75 mg metoprolol twice daily   No added salt.   Follow up in 2 weeks with [facility] for high blood pressure follow up

## 2022-09-09 ENCOUNTER — Other Ambulatory Visit: Payer: Self-pay | Admitting: Adult Health

## 2022-09-09 ENCOUNTER — Ambulatory Visit (INDEPENDENT_AMBULATORY_CARE_PROVIDER_SITE_OTHER): Payer: Medicare Other | Admitting: Adult Health

## 2022-09-09 ENCOUNTER — Encounter: Payer: Self-pay | Admitting: Adult Health

## 2022-09-09 VITALS — BP 120/88 | HR 79 | Temp 96.9°F | Resp 18 | Ht 63.0 in | Wt 134.0 lb

## 2022-09-09 DIAGNOSIS — G629 Polyneuropathy, unspecified: Secondary | ICD-10-CM | POA: Diagnosis not present

## 2022-09-09 DIAGNOSIS — I10 Essential (primary) hypertension: Secondary | ICD-10-CM

## 2022-09-09 DIAGNOSIS — F5101 Primary insomnia: Secondary | ICD-10-CM

## 2022-09-09 MED ORDER — METOPROLOL TARTRATE 50 MG PO TABS: 75.0000 mg | ORAL_TABLET | Freq: Two times a day (BID) | ORAL | 3 refills | Status: DC

## 2022-09-09 MED ORDER — GABAPENTIN 100 MG PO CAPS: 200.0000 mg | ORAL_CAPSULE | Freq: Every day | ORAL | 3 refills | Status: DC

## 2022-09-09 NOTE — Progress Notes (Unsigned)
[facility] clinic  Provider:  Durenda Age DNP  Code Status:  Full Code  Goals of Care:     08/26/2022    1:54 PM  Advanced Directives  Does Patient Have a Medical Advance Directive? Yes  Type of Paramedic of [place];Living will  Does patient want to make changes to medical advance directive? No - Patient declined  Copy of [place] in Chart? No - copy requested     Chief Complaint  Patient presents with   Follow-up    Patient is here for a 2 week F/U for blood pressure     HPI: Patient is a 81 y.o. female seen today for an acute visit for BP check/follow up hypertension.  Past Medical History:  Diagnosis Date   Allergic rhinitis    Anxiety    Arthritis    right shoulder   Congenital defect    left arm   Depression    GERD (gastroesophageal reflux disease)    Headache    Hyperlipemia    Hypertension    IBS (irritable bowel syndrome)    Insomnia    Memory loss    Vitamin D deficiency    Wears glasses     Past Surgical History:  Procedure Laterality Date   ABDOMINAL HYSTERECTOMY     CATARACT EXTRACTION Bilateral    NECK SURGERY     REVERSE SHOULDER ARTHROPLASTY Right 09/09/2019   Procedure: REVERSE SHOULDER ARTHROPLASTY;  Surgeon: Verner Mould, MD;  Location: [place];  Service: Orthopedics;  Laterality: Right;  15min    Allergies  Allergen Reactions   Norvasc [Amlodipine] Swelling    Pt not aware of    Benicar [Olmesartan] Other (See Comments)    Nervousness Pt not aware of    Lisinopril Cough    Pt not aware of     Outpatient Encounter Medications as of 09/09/2022  Medication Sig   acetaminophen (TYLENOL) 500 MG tablet Take 1,000 mg by mouth 3 (three) times daily.   cyclobenzaprine (FLEXERIL) 5 MG tablet TAKE 1 TABLET BY MOUTH EVERY 8 HOURS AS NEEDED FOR MUSCLE SPASM   gabapentin (NEURONTIN) 100 MG capsule Take 2 capsules (200 mg total) by mouth at bedtime.   losartan (COZAAR) 25 MG tablet  TAKE 1 TABLET(25 MG) BY MOUTH DAILY   metoprolol succinate (TOPROL-XL) 50 MG 24 hr tablet Take 75 mg by mouth 2 (two) times daily. Take with or immediately following a meal   [DISCONTINUED] melatonin 3 MG TABS tablet Take 1 tablet (3 mg total) by mouth at bedtime.   No facility-administered encounter medications on file as of 09/09/2022.    Review of Systems:  Review of Systems  Constitutional:  Negative for appetite change, chills, fatigue and fever.  HENT:  Negative for congestion, hearing loss, rhinorrhea and sore throat.   Eyes: Negative.   Respiratory:  Negative for cough, shortness of breath and wheezing.   Cardiovascular:  Negative for chest pain, palpitations and leg swelling.  Gastrointestinal:  Negative for abdominal pain, constipation, diarrhea, nausea and vomiting.  Genitourinary:  Negative for dysuria.  Musculoskeletal:  Negative for arthralgias, back pain and myalgias.  Skin:  Negative for color change, rash and wound.  Neurological:  Negative for dizziness, weakness and headaches.  Psychiatric/Behavioral:  Negative for behavioral problems. The patient is not nervous/anxious.     Health Maintenance  Topic Date Due   COVID-19 Vaccine (1) Never done   Zoster Vaccines- Shingrix (1 of 2) Never  done   Pneumonia Vaccine 64+ Years old (1 - PCV) Never done   DEXA SCAN  Never done   INFLUENZA VACCINE  07/12/2022   TETANUS/TDAP  03/07/2032   HPV VACCINES  Aged Out    Physical Exam: Vitals:   09/09/22 1356  BP: 120/88  Pulse: 79  Resp: 18  Temp: (!) 96.9 F (36.1 C)  SpO2: 97%  Weight: 134 lb (60.8 kg)  Height: 5\' 3"  (1.6 m)   Body mass index is 23.74 kg/m. Physical Exam Constitutional:      Appearance: Normal appearance.  HENT:     Head: Normocephalic and atraumatic.     Nose: Nose normal.     Mouth/Throat:     Mouth: Mucous membranes are moist.  Eyes:     Conjunctiva/sclera: Conjunctivae normal.  Cardiovascular:     Rate and Rhythm: Normal rate and  regular rhythm.  Pulmonary:     Effort: Pulmonary effort is normal.     Breath sounds: Normal breath sounds.  Abdominal:     General: Bowel sounds are normal.     Palpations: Abdomen is soft.  Musculoskeletal:        General: Normal range of motion.     Cervical back: Normal range of motion.     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  Neurological:     General: No focal deficit present.     Mental Status: She is alert and oriented to person, place, and time.  Psychiatric:        Mood and Affect: Mood normal.        Behavior: Behavior normal.        Thought Content: Thought content normal.        Judgment: Judgment normal.     Labs reviewed: Basic Metabolic Panel: Recent Labs    10/27/21 1322 07/12/22 1440  NA 140 141  K 3.8 3.8  CL 105 104  CO2 27 24  GLUCOSE 124* 84  BUN 11 13  CREATININE 0.80 0.71  CALCIUM 9.4 9.7   Liver Function Tests: Recent Labs    07/12/22 1440  AST 16  ALT 9  BILITOT 0.6  PROT 6.2   No results for input(s): "LIPASE", "AMYLASE" in the last 8760 hours. No results for input(s): "AMMONIA" in the last 8760 hours. CBC: Recent Labs    10/27/21 1322 07/12/22 1440  WBC 6.1 7.3  NEUTROABS  --  5,044  HGB 13.5 13.8  HCT 42.4 41.5  MCV 84.6 86.5  PLT 263 291   Lipid Panel: Recent Labs    07/12/22 1440  CHOL 267*  HDL 78  LDLCALC 159*  TRIG 163*  CHOLHDL 3.4   No results found for: "HGBA1C"  Procedures since last visit: No results found.  Assessment/Plan      Labs/tests ordered:  * No order type specified * Next appt:  Visit date not found

## 2022-09-09 NOTE — Patient Instructions (Signed)
Hypertension, Adult High blood pressure (hypertension) is when the force of blood pumping through the arteries is too strong. The arteries are the blood vessels that carry blood from the heart throughout the body. Hypertension forces the heart to work harder to pump blood and may cause arteries to become narrow or stiff. Untreated or uncontrolled hypertension can lead to a heart attack, heart failure, a stroke, kidney disease, and other problems. A blood pressure reading consists of a higher number over a lower number. Ideally, your blood pressure should be below 120/80. The first ("top") number is called the systolic pressure. It is a measure of the pressure in your arteries as your heart beats. The second ("bottom") number is called the diastolic pressure. It is a measure of the pressure in your arteries as the heart relaxes. What are the causes? The exact cause of this condition is not known. There are some conditions that result in high blood pressure. What increases the risk? Certain factors may make you more likely to develop high blood pressure. Some of these risk factors are under your control, including: Smoking. Not getting enough exercise or physical activity. Being overweight. Having too much fat, sugar, calories, or salt (sodium) in your diet. Drinking too much alcohol. Other risk factors include: Having a personal history of heart disease, diabetes, high cholesterol, or kidney disease. Stress. Having a family history of high blood pressure and high cholesterol. Having obstructive sleep apnea. Age. The risk increases with age. What are the signs or symptoms? High blood pressure may not cause symptoms. Very high blood pressure (hypertensive crisis) may cause: Headache. Fast or irregular heartbeats (palpitations). Shortness of breath. Nosebleed. Nausea and vomiting. Vision changes. Severe chest pain, dizziness, and seizures. How is this diagnosed? This condition is diagnosed by  measuring your blood pressure while you are seated, with your arm resting on a flat surface, your legs uncrossed, and your feet flat on the floor. The cuff of the blood pressure monitor will be placed directly against the skin of your upper arm at the level of your heart. Blood pressure should be measured at least twice using the same arm. Certain conditions can cause a difference in blood pressure between your right and left arms. If you have a high blood pressure reading during one visit or you have normal blood pressure with other risk factors, you may be asked to: Return on a different day to have your blood pressure checked again. Monitor your blood pressure at home for 1 week or longer. If you are diagnosed with hypertension, you may have other blood or imaging tests to help your health care provider understand your overall risk for other conditions. How is this treated? This condition is treated by making healthy lifestyle changes, such as eating healthy foods, exercising more, and reducing your alcohol intake. You may be referred for counseling on a healthy diet and physical activity. Your health care provider may prescribe medicine if lifestyle changes are not enough to get your blood pressure under control and if: Your systolic blood pressure is above 130. Your diastolic blood pressure is above 80. Your personal target blood pressure may vary depending on your medical conditions, your age, and other factors. Follow these instructions at home: Eating and drinking  Eat a diet that is high in fiber and potassium, and low in sodium, added sugar, and fat. An example of this eating plan is called the DASH diet. DASH stands for Dietary Approaches to Stop Hypertension. To eat this way: Eat   plenty of fresh fruits and vegetables. Try to fill one half of your plate at each meal with fruits and vegetables. Eat whole grains, such as whole-wheat pasta, brown rice, or whole-grain bread. Fill about one  fourth of your plate with whole grains. Eat or drink low-fat dairy products, such as skim milk or low-fat yogurt. Avoid fatty cuts of meat, processed or cured meats, and poultry with skin. Fill about one fourth of your plate with lean proteins, such as fish, chicken without skin, beans, eggs, or tofu. Avoid pre-made and processed foods. These tend to be higher in sodium, added sugar, and fat. Reduce your daily sodium intake. Many people with hypertension should eat less than 1,500 mg of sodium a day. Do not drink alcohol if: Your health care provider tells you not to drink. You are pregnant, may be pregnant, or are planning to become pregnant. If you drink alcohol: Limit how much you have to: 0-1 drink a day for women. 0-2 drinks a day for men. Know how much alcohol is in your drink. In the [place] one drink equals one 12 oz bottle of beer (355 mL), one 5 oz glass of wine (148 mL), or one 1 oz glass of hard liquor (44 mL). Lifestyle  Work with your health care provider to maintain a healthy body weight or to lose weight. Ask what an ideal weight is for you. Get at least 30 minutes of exercise that causes your heart to beat faster (aerobic exercise) most days of the week. Activities may include walking, swimming, or biking. Include exercise to strengthen your muscles (resistance exercise), such as Pilates or lifting weights, as part of your weekly exercise routine. Try to do these types of exercises for 30 minutes at least 3 days a week. Do not use any products that contain nicotine or tobacco. These products include cigarettes, chewing tobacco, and vaping devices, such as e-cigarettes. If you need help quitting, ask your health care provider. Monitor your blood pressure at home as told by your health care provider. Keep all follow-up visits. This is important. Medicines Take over-the-counter and prescription medicines only as told by your health care provider. Follow directions carefully. Blood  pressure medicines must be taken as prescribed. Do not skip doses of blood pressure medicine. Doing this puts you at risk for problems and can make the medicine less effective. Ask your health care provider about side effects or reactions to medicines that you should watch for. Contact a health care provider if you: Think you are having a reaction to a medicine you are taking. Have headaches that keep coming back (recurring). Feel dizzy. Have swelling in your ankles. Have trouble with your vision. Get help right away if you: Develop a severe headache or confusion. Have unusual weakness or numbness. Feel faint. Have severe pain in your chest or abdomen. Vomit repeatedly. Have trouble breathing. These symptoms may be an emergency. Get help right away. Call 911. Do not wait to see if the symptoms will go away. Do not drive yourself to the hospital. Summary Hypertension is when the force of blood pumping through your arteries is too strong. If this condition is not controlled, it may put you at risk for serious complications. Your personal target blood pressure may vary depending on your medical conditions, your age, and other factors. For most people, a normal blood pressure is less than 120/80. Hypertension is treated with lifestyle changes, medicines, or a combination of both. Lifestyle changes include losing weight, eating a healthy,   low-sodium diet, exercising more, and limiting alcohol. This information is not intended to replace advice given to you by your health care provider. Make sure you discuss any questions you have with your health care provider. Document Revised: 10/05/2021 Document Reviewed: 10/05/2021 Elsevier Patient Education  2023 Elsevier Inc.  

## 2022-09-13 ENCOUNTER — Telehealth: Payer: Self-pay

## 2022-09-13 NOTE — Telephone Encounter (Signed)
Incoming fax was received from patients Elizabeth [name] questioning metoprolol RX.  Handwritten notes states:  Patient has been getting the ER (Toprol XL), did you mean to change to the plain. Thanks  I reviewed note, which remains open and was unable to determine that this change was intentional (assessment and plan not complete).  Please advise

## 2022-09-14 NOTE — Telephone Encounter (Signed)
Medina-Vargas, Monina C, NP  [facility] 7 hours ago (1:26 AM)   Patient complains of ringing in her ears. Metoprolol succinate is usually taken as whole tablet and dosed once a day. I have changed her Metoprolol succinate to Metoprolol tartrate in order to give it to her as 1.5 tab of the 50 mg = 75 mg BID (short-acting). Hopefully, this medication change will stop ringing in her ears.      [place] notified. Spoke with Katrina.

## 2022-09-29 NOTE — Addendum Note (Signed)
Addended by: Durenda Age C on: 09/29/2022 12:06 PM   Modules accepted: Orders

## 2022-10-31 ENCOUNTER — Ambulatory Visit: Payer: Medicare Other

## 2022-12-01 ENCOUNTER — Encounter: Payer: Self-pay | Admitting: Adult Health

## 2022-12-01 ENCOUNTER — Ambulatory Visit (INDEPENDENT_AMBULATORY_CARE_PROVIDER_SITE_OTHER): Payer: Medicare Other | Admitting: Adult Health

## 2022-12-01 VITALS — BP 180/94 | HR 78 | Temp 97.2°F | Ht 63.0 in | Wt 136.4 lb

## 2022-12-01 DIAGNOSIS — I10 Essential (primary) hypertension: Secondary | ICD-10-CM | POA: Diagnosis not present

## 2022-12-01 DIAGNOSIS — G629 Polyneuropathy, unspecified: Secondary | ICD-10-CM

## 2022-12-01 DIAGNOSIS — H04123 Dry eye syndrome of bilateral lacrimal glands: Secondary | ICD-10-CM

## 2022-12-01 MED ORDER — POTASSIUM CHLORIDE CRYS ER 10 MEQ PO TBCR: 10.0000 meq | EXTENDED_RELEASE_TABLET | ORAL | 3 refills | Status: AC

## 2022-12-01 MED ORDER — ARTIFICIAL TEARS OPHTHALMIC OINT
TOPICAL_OINTMENT | Freq: Every day | OPHTHALMIC | 3 refills | Status: AC
Start: 2022-12-01 — End: ?

## 2022-12-01 MED ORDER — HYDROCHLOROTHIAZIDE 25 MG PO TABS: 25.0000 mg | ORAL_TABLET | Freq: Every day | ORAL | 3 refills | Status: DC

## 2022-12-01 MED ORDER — GABAPENTIN 100 MG PO CAPS: ORAL_CAPSULE | ORAL | 3 refills | Status: DC

## 2022-12-01 NOTE — Patient Instructions (Signed)
Managing Your Hypertension Hypertension, also called high blood pressure, is when the force of the blood pressing against the walls of the arteries is too strong. Arteries are blood vessels that carry blood from your heart throughout your body. Hypertension forces the heart to work harder to pump blood and may cause the arteries to become narrow or stiff. Understanding blood pressure readings A blood pressure reading includes a higher number over a lower number: The first, or top, number is called the systolic pressure. It is a measure of the pressure in your arteries as your heart beats. The second, or bottom number, is called the diastolic pressure. It is a measure of the pressure in your arteries as the heart relaxes. For most people, a normal blood pressure is below 120/80. Your personal target blood pressure may vary depending on your medical conditions, your age, and other factors. Blood pressure is classified into four stages. Based on your blood pressure reading, your health care provider may use the following stages to determine what type of treatment you need, if any. Systolic pressure and diastolic pressure are measured in a unit called millimeters of mercury (mmHg). Normal Systolic pressure: below 120. Diastolic pressure: below 80. Elevated Systolic pressure: 120-129. Diastolic pressure: below 80. Hypertension stage 1 Systolic pressure: 130-139. Diastolic pressure: 80-89. Hypertension stage 2 Systolic pressure: 140 or above. Diastolic pressure: 90 or above. How can this condition affect me? Managing your hypertension is very important. Over time, hypertension can damage the arteries and decrease blood flow to parts of the body, including the brain, heart, and kidneys. Having untreated or uncontrolled hypertension can lead to: A heart attack. A stroke. A weakened blood vessel (aneurysm). Heart failure. Kidney damage. Eye damage. Memory and concentration problems. Vascular  dementia. What actions can I take to manage this condition? Hypertension can be managed by making lifestyle changes and possibly by taking medicines. Your health care provider will help you make a plan to bring your blood pressure within a normal range. You may be referred for counseling on a healthy diet and physical activity. Nutrition  Eat a diet that is high in fiber and potassium, and low in salt (sodium), added sugar, and fat. An example eating plan is called the DASH diet. DASH stands for Dietary Approaches to Stop Hypertension. To eat this way: Eat plenty of fresh fruits and vegetables. Try to fill one-half of your plate at each meal with fruits and vegetables. Eat whole grains, such as whole-wheat pasta, brown rice, or whole-grain bread. Fill about one-fourth of your plate with whole grains. Eat low-fat dairy products. Avoid fatty cuts of meat, processed or cured meats, and poultry with skin. Fill about one-fourth of your plate with lean proteins such as fish, chicken without skin, beans, eggs, and tofu. Avoid pre-made and processed foods. These tend to be higher in sodium, added sugar, and fat. Reduce your daily sodium intake. Many people with hypertension should eat less than 1,500 mg of sodium a day. Lifestyle  Work with your health care provider to maintain a healthy body weight or to lose weight. Ask what an ideal weight is for you. Get at least 30 minutes of exercise that causes your heart to beat faster (aerobic exercise) most days of the week. Activities may include walking, swimming, or biking. Include exercise to strengthen your muscles (resistance exercise), such as weight lifting, as part of your weekly exercise routine. Try to do these types of exercises for 30 minutes at least 3 days a week. Do   not use any products that contain nicotine or tobacco. These products include cigarettes, chewing tobacco, and vaping devices, such as e-cigarettes. If you need help quitting, ask your  health care provider. Control any long-term (chronic) conditions you have, such as high cholesterol or diabetes. Identify your sources of stress and find ways to manage stress. This may include meditation, deep breathing, or making time for fun activities. Alcohol use Do not drink alcohol if: Your health care provider tells you not to drink. You are pregnant, may be pregnant, or are planning to become pregnant. If you drink alcohol: Limit how much you have to: 0-1 drink a day for women. 0-2 drinks a day for men. Know how much alcohol is in your drink. In the [place] one drink equals one 12 oz bottle of beer (355 mL), one 5 oz glass of wine (148 mL), or one 1 oz glass of hard liquor (44 mL). Medicines Your health care provider may prescribe medicine if lifestyle changes are not enough to get your blood pressure under control and if: Your systolic blood pressure is 130 or higher. Your diastolic blood pressure is 80 or higher. Take medicines only as told by your health care provider. Follow the directions carefully. Blood pressure medicines must be taken as told by your health care provider. The medicine does not work as well when you skip doses. Skipping doses also puts you at risk for problems. Monitoring Before you monitor your blood pressure: Do not smoke, drink caffeinated beverages, or exercise within 30 minutes before taking a measurement. Use the bathroom and empty your bladder (urinate). Sit quietly for at least 5 minutes before taking measurements. Monitor your blood pressure at home as told by your health care provider. To do this: Sit with your back straight and supported. Place your feet flat on the floor. Do not cross your legs. Support your arm on a flat surface, such as a table. Make sure your upper arm is at heart level. Each time you measure, take two or three readings one minute apart and record the results. You may also need to have your blood pressure checked regularly by  your health care provider. General information Talk with your health care provider about your diet, exercise habits, and other lifestyle factors that may be contributing to hypertension. Review all the medicines you take with your health care provider because there may be side effects or interactions. Keep all follow-up visits. Your health care provider can help you create and adjust your plan for managing your high blood pressure. Where to find more information National Heart, Lung, and Blood Institute: www.nhlbi.nih.gov American Heart Association: www.heart.org Contact a health care provider if: You think you are having a reaction to medicines you have taken. You have repeated (recurrent) headaches. You feel dizzy. You have swelling in your ankles. You have trouble with your vision. Get help right away if: You develop a severe headache or confusion. You have unusual weakness or numbness, or you feel faint. You have severe pain in your chest or abdomen. You vomit repeatedly. You have trouble breathing. These symptoms may be an emergency. Get help right away. Call 911. Do not wait to see if the symptoms will go away. Do not drive yourself to the hospital. Summary Hypertension is when the force of blood pumping through your arteries is too strong. If this condition is not controlled, it may put you at risk for serious complications. Your personal target blood pressure may vary depending on your medical conditions,   your age, and other factors. For most people, a normal blood pressure is less than 120/80. Hypertension is managed by lifestyle changes, medicines, or both. Lifestyle changes to help manage hypertension include losing weight, eating a healthy, low-sodium diet, exercising more, stopping smoking, and limiting alcohol. This information is not intended to replace advice given to you by your health care provider. Make sure you discuss any questions you have with your health care  provider. Document Revised: 08/12/2021 Document Reviewed: 08/12/2021 Elsevier Patient Education  2023 Elsevier Inc.  

## 2022-12-01 NOTE — Progress Notes (Signed)
[facility] clinic  Provider:  Kenard Gower DNP  Code Status:  Full Code  Goals of Care:     08/26/2022    1:54 PM  Advanced Directives  Does Patient Have a Medical Advance Directive? Yes  Type of Estate agent of [place];Living will  Does patient want to make changes to medical advance directive? No - Patient declined  Copy of Healthcare Power of Attorney in Chart? No - copy requested     Chief Complaint  Patient presents with   Medical Management of Chronic Issues    Patient presents today for 3 month follow-up    HPI: Patient is a 81 y.o. female seen today for medical management of chronic issues.  Uncontrolled hypertension - BP180/94, takes Losartan and Metoprolol tartrate  Dry eyes - complains  of excessive tearing  Neuropathy - left arm with moderate pain, takes Gabapentin and Cyclobenzaprine   Past Medical History:  Diagnosis Date   Allergic rhinitis    Anxiety    Arthritis    right shoulder   Congenital defect    left arm   Depression    GERD (gastroesophageal reflux disease)    Headache    Hyperlipemia    Hypertension    IBS (irritable bowel syndrome)    Insomnia    Memory loss    Vitamin D deficiency    Wears glasses     Past Surgical History:  Procedure Laterality Date   ABDOMINAL HYSTERECTOMY     CATARACT EXTRACTION Bilateral    NECK SURGERY     REVERSE SHOULDER ARTHROPLASTY Right 09/09/2019   Procedure: REVERSE SHOULDER ARTHROPLASTY;  Surgeon: Ernest Mallick, MD;  Location: [facility] OR;  Service: Orthopedics;  Laterality: Right;     Allergies  Allergen Reactions   Norvasc [Amlodipine] Swelling    Pt not aware of    Benicar [Olmesartan] Other (See Comments)    Nervousness Pt not aware of    Lisinopril Cough    Pt not aware of     Outpatient Encounter Medications as of 12/01/2022  Medication Sig   acetaminophen (TYLENOL) 500 MG tablet Take 1,000 mg by mouth 3 (three) times daily.   artificial tears  (LACRILUBE) OINT ophthalmic ointment Place into both eyes at bedtime.   cyclobenzaprine (FLEXERIL) 5 MG tablet TAKE 1 TABLET BY MOUTH EVERY 8 HOURS AS NEEDED FOR MUSCLE SPASM   hydrochlorothiazide (HYDRODIURIL) 25 MG tablet Take 1 tablet (25 mg total) by mouth daily.   metoprolol tartrate (LOPRESSOR) 50 MG tablet Take 1.5 tablets (75 mg total) by mouth 2 (two) times daily.   [START ON 12/02/2022] potassium chloride (KLOR-CON M) 10 MEQ tablet Take 1 tablet (10 mEq total) by mouth 3 (three) times a week.   [DISCONTINUED] gabapentin (NEURONTIN) 100 MG capsule Take 2 capsules (200 mg total) by mouth at bedtime.   [DISCONTINUED] losartan (COZAAR) 25 MG tablet TAKE 1 TABLET(25 MG) BY MOUTH DAILY   gabapentin (NEURONTIN) 100 MG capsule Take 1= 100 mg  capsule in the morning and 2 capsules = 200 mg at bedtime   No facility-administered encounter medications on file as of 12/01/2022.    Review of Systems:  Review of Systems  Constitutional:  Negative for appetite change, chills, fatigue and fever.  HENT:  Negative for congestion, hearing loss, rhinorrhea and sore throat.   Eyes: Negative.        Excessive tearing  Respiratory:  Negative for cough, shortness of breath and wheezing.   Cardiovascular:  Negative for chest pain, palpitations and leg swelling.  Gastrointestinal:  Negative for abdominal pain, constipation, diarrhea, nausea and vomiting.  Genitourinary:  Negative for dysuria.  Musculoskeletal:  Negative for arthralgias, back pain and myalgias.       LUE pain  Skin:  Negative for color change, rash and wound.  Neurological:  Negative for dizziness, weakness and headaches.  Psychiatric/Behavioral:  Negative for behavioral problems. The patient is not nervous/anxious.     Health Maintenance  Topic Date Due   COVID-19 Vaccine (1) Never done   Zoster Vaccines- Shingrix (1 of 2) Never done   Pneumonia Vaccine 64+ Years old (1 - PCV) Never done   DEXA SCAN  Never done   Medicare Annual  Wellness (AWV)  02/17/2022   DTaP/Tdap/Td (4 - Td or Tdap) 03/07/2032   INFLUENZA VACCINE  Completed   HPV VACCINES  Aged Out    Physical Exam: Vitals:   12/01/22 0840  BP: (!) 180/94  Pulse: 78  Temp: (!) 97.2 F (36.2 C)  SpO2: 96%  Weight: 136 lb 6.4 oz (61.9 kg)  Height: 5\' 3"  (1.6 m)   Body mass index is 24.16 kg/m. Physical Exam Constitutional:      Appearance: Normal appearance.  HENT:     Head: Normocephalic and atraumatic.     Nose: Nose normal.     Mouth/Throat:     Mouth: Mucous membranes are moist.  Eyes:     Conjunctiva/sclera: Conjunctivae normal.  Cardiovascular:     Rate and Rhythm: Normal rate and regular rhythm.  Pulmonary:     Effort: Pulmonary effort is normal.     Breath sounds: Normal breath sounds.  Abdominal:     General: Bowel sounds are normal.     Palpations: Abdomen is soft.  Musculoskeletal:        General: Normal range of motion.     Cervical back: Normal range of motion.     Comments: No left hand  Skin:    General: Skin is warm and dry.  Neurological:     General: No focal deficit present.     Mental Status: She is alert and oriented to person, place, and time.  Psychiatric:        Mood and Affect: Mood normal.        Behavior: Behavior normal.        Thought Content: Thought content normal.        Judgment: Judgment normal.     Labs reviewed: Basic Metabolic Panel: Recent Labs    07/12/22 1440  NA 141  K 3.8  CL 104  CO2 24  GLUCOSE 84  BUN 13  CREATININE 0.71  CALCIUM 9.7   Liver Function Tests: Recent Labs    07/12/22 1440  AST 16  ALT 9  BILITOT 0.6  PROT 6.2   No results for input(s): "LIPASE", "AMYLASE" in the last 8760 hours. No results for input(s): "AMMONIA" in the last 8760 hours. CBC: Recent Labs    07/12/22 1440  WBC 7.3  NEUTROABS 5,044  HGB 13.8  HCT 41.5  MCV 86.5  PLT 291   Lipid Panel: Recent Labs    07/12/22 1440  CHOL 267*  HDL 78  LDLCALC 159*  TRIG 163*  CHOLHDL 3.4    No results found for: "HGBA1C"  Procedures since last visit: No results found.  Assessment/Plan  1. Uncontrolled hypertension -  BP 180/94, elevated -  will add HCTZ, continue Metoprolol tartrate - hydrochlorothiazide (HYDRODIURIL) 25 MG  tablet; Take 1 tablet (25 mg total) by mouth daily.  Dispense: 30 tablet; Refill: 3 - potassium chloride (KLOR-CON M) 10 MEQ tablet; Take 1 tablet (10 mEq total) by mouth 3 (three) times a week.  Dispense: 13 tablet; Refill: 3  2. Dry eyes - artificial tears (LACRILUBE) OINT ophthalmic ointment; Place into both eyes at bedtime.  Dispense: 1 g; Refill: 3  3. Neuropathy -  will add Gabapentin 100 mg in the morning and continue 200 mg at bedtime - gabapentin (NEURONTIN) 100 MG capsule; Take 1= 100 mg  capsule in the morning and 2 capsules = 200 mg at bedtime  Dispense: 90 capsule; Refill: 3 -  continue Cyclobenzaprine    Labs/tests ordered:  None  Next appt:  in 1 month

## 2022-12-02 ENCOUNTER — Ambulatory Visit: Payer: Medicare Other | Admitting: Adult Health

## 2022-12-09 ENCOUNTER — Ambulatory Visit: Payer: Medicare Other | Admitting: Adult Health

## 2022-12-27 ENCOUNTER — Ambulatory Visit
Admission: RE | Admit: 2022-12-27 | Discharge: 2022-12-27 | Disposition: A | Discharge status: Home / Self Care | Payer: Medicare Other | Source: Ambulatory Visit | Attending: Adult Health | Admitting: Adult Health

## 2022-12-27 DIAGNOSIS — Z1231 Encounter for screening mammogram for malignant neoplasm of breast: Secondary | ICD-10-CM

## 2023-01-02 ENCOUNTER — Encounter: Payer: Self-pay | Admitting: Adult Health

## 2023-01-02 ENCOUNTER — Ambulatory Visit (INDEPENDENT_AMBULATORY_CARE_PROVIDER_SITE_OTHER): Payer: Medicare Other | Admitting: Adult Health

## 2023-01-02 VITALS — BP 122/72 | HR 67 | Temp 97.1°F | Ht 63.0 in | Wt 138.0 lb

## 2023-01-02 DIAGNOSIS — H9313 Tinnitus, bilateral: Secondary | ICD-10-CM

## 2023-01-02 DIAGNOSIS — G629 Polyneuropathy, unspecified: Secondary | ICD-10-CM | POA: Diagnosis not present

## 2023-01-02 DIAGNOSIS — I10 Essential (primary) hypertension: Secondary | ICD-10-CM | POA: Diagnosis not present

## 2023-01-02 DIAGNOSIS — L821 Other seborrheic keratosis: Secondary | ICD-10-CM

## 2023-01-02 MED ORDER — HYDROCHLOROTHIAZIDE 25 MG PO TABS: 25.0000 mg | ORAL_TABLET | Freq: Every day | ORAL | 3 refills | Status: AC

## 2023-01-02 MED ORDER — GABAPENTIN 100 MG PO CAPS: ORAL_CAPSULE | ORAL | 3 refills | Status: AC

## 2023-01-02 MED ORDER — METOPROLOL TARTRATE 50 MG PO TABS: 75.0000 mg | ORAL_TABLET | Freq: Two times a day (BID) | ORAL | 3 refills | Status: AC

## 2023-01-02 NOTE — Progress Notes (Signed)
[facility] clinic  Provider:  Durenda Age DNP  Code Status:  Full Code  Goals of Care:     08/26/2022    1:54 PM  Advanced Directives  Does Patient Have a Medical Advance Directive? Yes  Type of Paramedic of [place];Living will  Does patient want to make changes to medical advance directive? No - Patient declined  Copy of [place] in Chart? No - copy requested     Chief Complaint  Patient presents with   Medical Management of Chronic Issues    Patient presents today for a 1 month follow-up    HPI: Patient is a 82 y.o. female seen today for medical management of chronic issues. BP today 122/72, takes HCTZ and Metoprolol tartrate for hypertension.  She complains of multiple flat light brown warts under bilateral breast up to the back. She complains that they burn. No noted erythema nor drainage.  She stated that she continues to hear buzzing in her ears and hear music in the morning [facility] called her but when she called them, they don't pick up. She has left them a voice message.   Past Medical History:  Diagnosis Date   Allergic rhinitis    Anxiety    Arthritis    right shoulder   Congenital defect    left arm   Depression    GERD (gastroesophageal reflux disease)    Headache    Hyperlipemia    Hypertension    IBS (irritable bowel syndrome)    Insomnia    Memory loss    Vitamin D deficiency    Wears glasses     Past Surgical History:  Procedure Laterality Date   ABDOMINAL HYSTERECTOMY     CATARACT EXTRACTION Bilateral    NECK SURGERY     REVERSE SHOULDER ARTHROPLASTY Right 09/09/2019   Procedure: REVERSE SHOULDER ARTHROPLASTY;  Surgeon: Verner Mould, MD;  Location: [place];  Service: Orthopedics;  Laterality: Right;  142min    Allergies  Allergen Reactions   Norvasc [Amlodipine] Swelling    Pt not aware of    Benicar [Olmesartan] Other (See Comments)    Nervousness Pt not aware of     Lisinopril Cough    Pt not aware of     Outpatient Encounter Medications as of 01/02/2023  Medication Sig   acetaminophen (TYLENOL) 500 MG tablet Take 1,000 mg by mouth 3 (three) times daily.   artificial tears (LACRILUBE) OINT ophthalmic ointment Place into both eyes at bedtime.   cyclobenzaprine (FLEXERIL) 5 MG tablet TAKE 1 TABLET BY MOUTH EVERY 8 HOURS AS NEEDED FOR MUSCLE SPASM   gabapentin (NEURONTIN) 100 MG capsule Take 1= 100 mg  capsule in the morning and 2 capsules = 200 mg at bedtime   hydrochlorothiazide (HYDRODIURIL) 25 MG tablet Take 1 tablet (25 mg total) by mouth daily.   metoprolol tartrate (LOPRESSOR) 50 MG tablet Take 1.5 tablets (75 mg total) by mouth 2 (two) times daily.   potassium chloride (KLOR-CON M) 10 MEQ tablet Take 1 tablet (10 mEq total) by mouth 3 (three) times a week.   No facility-administered encounter medications on file as of 01/02/2023.    Review of Systems:  Review of Systems  Constitutional:  Negative for appetite change, chills, fatigue and fever.  HENT:  Negative for congestion, hearing loss, rhinorrhea and sore throat.        Bilateral ears buzzing  Eyes: Negative.   Respiratory:  Negative for cough,  shortness of breath and wheezing.   Cardiovascular:  Negative for chest pain, palpitations and leg swelling.  Gastrointestinal:  Negative for abdominal pain, constipation, diarrhea, nausea and vomiting.  Genitourinary:  Negative for dysuria.  Musculoskeletal:  Negative for arthralgias, back pain and myalgias.  Skin:  Negative for color change, rash and wound.  Neurological:  Negative for dizziness, weakness and headaches.  Psychiatric/Behavioral:  Negative for behavioral problems. The patient is not nervous/anxious.     Health Maintenance  Topic Date Due   COVID-19 Vaccine (1) Never done   Zoster Vaccines- Shingrix (1 of 2) Never done   Pneumonia Vaccine 58+ Years old (1 - PCV) Never done   DEXA SCAN  Never done   Medicare Annual Wellness  (AWV)  02/17/2022   DTaP/Tdap/Td (4 - Td or Tdap) 03/07/2032   INFLUENZA VACCINE  Completed   HPV VACCINES  Aged Out    Physical Exam: Vitals:   01/02/23 1252  BP: 122/72  Pulse: 67  Temp: (!) 97.1 F (36.2 C)  SpO2: 98%  Weight: 138 lb (62.6 kg)  Height: 5\' 3"  (1.6 m)   Body mass index is 24.45 kg/m. Physical Exam Constitutional:      Appearance: Normal appearance.  HENT:     Head: Normocephalic and atraumatic.     Nose: Nose normal.     Mouth/Throat:     Mouth: Mucous membranes are moist.  Eyes:     Conjunctiva/sclera: Conjunctivae normal.  Cardiovascular:     Rate and Rhythm: Normal rate and regular rhythm.  Pulmonary:     Effort: Pulmonary effort is normal.     Breath sounds: Normal breath sounds.  Abdominal:     General: Bowel sounds are normal.     Palpations: Abdomen is soft.  Musculoskeletal:        General: Normal range of motion.     Cervical back: Normal range of motion.     Comments: No hand on LUE  Skin:    General: Skin is warm and dry.     Comments: Flat, brownish warts under bilateral breast and back  Neurological:     General: No focal deficit present.     Mental Status: She is alert and oriented to person, place, and time.  Psychiatric:        Mood and Affect: Mood normal.        Behavior: Behavior normal.        Thought Content: Thought content normal.        Judgment: Judgment normal.     Labs reviewed: Basic Metabolic Panel: Recent Labs    07/12/22 1440  NA 141  K 3.8  CL 104  CO2 24  GLUCOSE 84  BUN 13  CREATININE 0.71  CALCIUM 9.7   Liver Function Tests: Recent Labs    07/12/22 1440  AST 16  ALT 9  BILITOT 0.6  PROT 6.2   No results for input(s): "LIPASE", "AMYLASE" in the last 8760 hours. No results for input(s): "AMMONIA" in the last 8760 hours. CBC: Recent Labs    07/12/22 1440  WBC 7.3  NEUTROABS 5,044  HGB 13.8  HCT 41.5  MCV 86.5  PLT 291   Lipid Panel: Recent Labs    07/12/22 1440  CHOL 267*   HDL 78  LDLCALC 159*  TRIG 163*  CHOLHDL 3.4   No results found for: "HGBA1C"  Procedures since last visit: MM 3D SCREEN BREAST BILATERAL  Result Date: 12/28/2022 CLINICAL DATA:  Screening. EXAM: DIGITAL  SCREENING BILATERAL MAMMOGRAM WITH TOMOSYNTHESIS AND CAD TECHNIQUE: Bilateral screening digital craniocaudal and mediolateral oblique mammograms were obtained. Bilateral screening digital breast tomosynthesis was performed. The images were evaluated with computer-aided detection. COMPARISON:  Previous exam(s). ACR Breast Density Category b: There are scattered areas of fibroglandular density. FINDINGS: There are no findings suspicious for malignancy. IMPRESSION: No mammographic evidence of malignancy. A result letter of this screening mammogram will be mailed directly to the patient. RECOMMENDATION: Screening mammogram in one year. (Code:SM-B-01Y) BI-RADS CATEGORY  1: Negative. Electronically Signed   By: Audie Pinto M.D.   On: 12/28/2022 13:38    Assessment/Plan  1. Primary hypertension -  BP 122/72, stable - hydrochlorothiazide (HYDRODIURIL) 25 MG tablet; Take 1 tablet (25 mg total) by mouth daily.  Dispense: 30 tablet; Refill: 3 - metoprolol tartrate (LOPRESSOR) 50 MG tablet; Take 1.5 tablets (75 mg total) by mouth 2 (two) times daily.  Dispense: 180 tablet; Refill: 3  2. Seborrheic keratosis -  has multiple flat light brown warts under bilateral breasts, dry and no erythema - Ambulatory referral to Dermatology  3. Tinnitus of both ears -  follow up with ENT  4. Neuropathy - gabapentin (NEURONTIN) 100 MG capsule; Take 1= 100 mg  capsule in the morning and 2 capsules = 200 mg at bedtime  Dispense: 90 capsule; Refill: 3   Labs/tests ordered:  None  Next appt:  in 3 months

## 2023-01-02 NOTE — Patient Instructions (Addendum)
Seborrheic Keratosis A seborrheic keratosis is a common, noncancerous (benign) skin growth. These growths are velvety, waxy, or rough spots that appear on the skin. They are often tan, brown, or black. The skin growths can be flat or raised and may be scaly. What are the causes? The cause of this condition is not known. What increases the risk? You are more likely to develop this condition if you: Have a family history of seborrheic keratosis. Are 84 years old or older. Are pregnant. Have had estrogen replacement therapy. What are the signs or symptoms? Symptoms of this condition include growths on the face, chest, shoulders, back, or other areas. These growths: Are usually painless, but may become irritated and itchy. Can be tan, yellow, brown, black, or other colors. Are slightly raised or have a flat surface. Are sometimes rough or wart-like in texture. Are often velvety or waxy on the surface. Are round or oval-shaped. Often occur in groups, but may occur as a single growth. How is this diagnosed? This condition is diagnosed with a medical history and physical exam. A sample of the growth may be tested (skin biopsy). You may also need to see a skin specialist (dermatologist). How is this treated? Treatment is not usually needed for this condition unless the growths are irritated or bleed often. You may also choose to have the growths removed if you do not like their appearance. Growth removal may include a procedure in which: Liquid nitrogen is applied to "freeze" off the growth (cryosurgery). This is the most common procedure. The growth is burned off with electricity (electrocautery). The growth is removed by scraping (curettage). Follow these instructions at home: Watch your growth or growths for any changes. Do not scratch or pick at the growth or growths. This can cause them to become irritated or infected. Contact a health care provider if: You suddenly have many new  growths. Your growth bleeds, itches, or hurts. Your growth suddenly becomes larger or changes color. Summary A seborrheic keratosis is a common, noncancerous skin growth. Treatment is not usually needed for this condition unless the growths are irritated or bleed often. Watch your growth or growths for any changes. Contact a health care provider if you suddenly have many new growths or your growth suddenly becomes larger or changes color. This information is not intended to replace advice given to you by your health care provider. Make sure you discuss any questions you have with your health care provider. Document Revised: 02/11/2022 Document Reviewed: 02/11/2022 Elsevier Patient Education  2023 [employer]  

## 2023-01-03 NOTE — Progress Notes (Signed)
PATIENT: Elizabeth [name] DOB: 11-28-1941    [facility] provider: Dr. Terrace Arabia REASON FOR VISIT: follow up neck pain HISTORY FROM: patient  Chief Complaint  Patient presents with   Follow-up    RM 3 alone Pt is well and stable, no new concerns.      HPI:  Update 01/04/2023 JM: Patient returns for 1 year follow-up.  Continues on Flexeril for cervicalgia, stable since prior visit. Continued benefit with Flexeril 5mg  BID. At some point, stopped duloxetine but she is unable to say reason for stopped or when.  Cognition stable since prior visit.  Continued gait impairment, stable since prior visit.  She is scheduled to see ENT in March for tinnitus and waiting to be seen by dermatology for new rash on her chest and abdomen.  Blood pressure slightly elevated today but she feels this is due to the rash, monitors at home and typically stable. She was advised to f/u with PCP if BP remains elevated.     History provided for reference purposes only Update 01/04/2022 JM: Returns for overdue 1 year follow-up. Stable from neuro standpoint. Neck pain stable on duloxetine and Flexeril, denies side effects. Cognition stable without worsening. Chronic gait impairment stable. Denies any recent falls. Does c/o continued depression - lost her 52 yo dog back in November and still having grief and increased depression for this. She has not yet established care with [facility] - referral placed at prior visit but upon review, referral sent to office not accepting patients. She is interested in establishing care with [facility]. No new concerns at this time.   Update 07/14/2020 JM: Elizabeth [name] returns for 62-month follow-up regarding neck pain/occipital headache, chronic gait instability and mild cognitive impairment.   Chronic gait abnormality: Gradual onset since 2013.  3 sessions with PT with last session 02/06/2020.  She has had a couple falls since prior visit with minor skin abrasion but otherwise no other injury.  She denies  losing balance or legs giving out but more due to tripping or not lifting her legs high enough  Neck pain/occipital headache: Stable.  Ongoing benefit of medication.  Continues on Cymbalta and Flexeril without side effects  Mild cognitive impairment stable without worsening.  Does report continued depression and anxiety and is interested in referral to behavioral health  Update 01/07/2020 JM Elizabeth [name] is a 82 year old female who is being seen today for 62-month follow-up with prior visit 06/2019.  Ongoing use of Cymbalta and and flexeril with ongoing benefit regarding neck pain and anxiety.  She did undergo right reverse total shoulder arthroplasty on 09/09/2019 due to osteoarthritis tolerated well without complication.  She does endorse increased falls since the holidays and is unsure if this is due to tripping or loss of balance.  Falls typically occur when ambulating on uneven surfaces or walking her dog.  She does have left orbital ecchymosis from prior fall.  Denies loss of consciousness or any other injuries.  She is interested in participating in outpatient PT for possible deconditioning.  At prior visit, she was referred to behavioral health but she did not make initial evaluation as she was not interested in virtual visits.  She does states she will participate when she is able to be seen in the office.  Memory has been stable.  No further concerns at this time.   History: Elizabeth [name] is a 82 year old female with PMH  hypertension, orthostatic hypotension, depression anxiety, chronic insomnia, hyperlipidemia, congenital left arm defect, vitamin D  deficiency, cervical decompression more than 30 years ago with benefit.  Initially evaluated by Dr. Terrace Arabia on 02/06/2017 after PCP referred to our office for evaluation of memory loss and head injury with ongoing complaints of memory disturbance, altered mental status, generalized weakness and speech difficulty.  In 11/2016, had a fall resulting in abrasion  and transient loss of consciousness but no further injuries. She reported gradual onset gait abnormality since 2013, fell often, legs give out underneath her often, right since to be more frequent, she denies bilateral feet paresthesia, denies bowel and bladder incontinence.  MRI brain 02/2017-generalized atrophy supratentorium small vessel disease EEG 05/2017 mild generalized slowing during admission acute encephalopathy EMG/NCV 07/2017 moderate right carpal tunnel syndrome MRI cervical spine 11/2016 no evidence of nerve root or spinal cord compression with only mild to moderate DJD CT head 01/2019 traumatic right frontal lobe parenchymal hemorrhage post fall       REVIEW OF SYSTEMS: Out of a complete 14 system review of symptoms, the patient complains only of the following symptoms, and all other reviewed systems are negative. Depression, anxiety, gait impairment and neck pain   ALLERGIES: Allergies  Allergen Reactions   Norvasc [Amlodipine] Swelling    Pt not aware of    Benicar [Olmesartan] Other (See Comments)    Nervousness Pt not aware of    Lisinopril Cough    Pt not aware of     HOME MEDICATIONS: Outpatient Medications Prior to Visit  Medication Sig Dispense Refill   acetaminophen (TYLENOL) 500 MG tablet Take 1,000 mg by mouth 3 (three) times daily.     artificial tears (LACRILUBE) OINT ophthalmic ointment Place into both eyes at bedtime. 1 g 3   gabapentin (NEURONTIN) 100 MG capsule Take 1= 100 mg  capsule in the morning and 2 capsules = 200 mg at bedtime 90 capsule 3   hydrochlorothiazide (HYDRODIURIL) 25 MG tablet Take 1 tablet (25 mg total) by mouth daily. 30 tablet 3   metoprolol tartrate (LOPRESSOR) 50 MG tablet Take 1.5 tablets (75 mg total) by mouth 2 (two) times daily. 180 tablet 3   potassium chloride (KLOR-CON M) 10 MEQ tablet Take 1 tablet (10 mEq total) by mouth 3 (three) times a week. 13 tablet 3   cyclobenzaprine (FLEXERIL) 5 MG tablet TAKE 1 TABLET BY MOUTH  EVERY 8 HOURS AS NEEDED FOR MUSCLE SPASM (Patient not taking: Reported on 01/04/2023) 180 tablet 3   No facility-administered medications prior to visit.    PAST MEDICAL HISTORY: Past Medical History:  Diagnosis Date   Allergic rhinitis    Anxiety    Arthritis    right shoulder   Congenital defect    left arm   Depression    GERD (gastroesophageal reflux disease)    Headache    Hyperlipemia    Hypertension    IBS (irritable bowel syndrome)    Insomnia    Memory loss    Vitamin D deficiency    Wears glasses     PAST SURGICAL HISTORY: Past Surgical History:  Procedure Laterality Date   ABDOMINAL HYSTERECTOMY     CATARACT EXTRACTION Bilateral    NECK SURGERY     REVERSE SHOULDER ARTHROPLASTY Right 09/09/2019   Procedure: REVERSE SHOULDER ARTHROPLASTY;  Surgeon: Ernest Mallick, MD;  Location: [facility] OR;  Service: Orthopedics;  Laterality: Right;     FAMILY HISTORY: Family History  Adopted: Yes  Problem Relation Age of Onset   Breast cancer Mother    Other Father  unsure of history    SOCIAL HISTORY: Social History   Socioeconomic History   Marital status: Widowed    Spouse name: Not on file   Number of children: 1   Years of education: HS   Highest education level: Not on file  Occupational History   Occupation: Retired  Tobacco Use   Smoking status: Never   Smokeless tobacco: Never  Vaping Use   Vaping Use: Never used  Substance and Sexual Activity   Alcohol use: No   Drug use: No   Sexual activity: Not on file  Other Topics Concern   Not on file  Social History Narrative   Lives at [facility] at this time.  Daughter Erskine Squibb and Victorino Dike.    Right-handed.   No caffeine use.   Social Determinants of Health   Financial Resource Strain: Not on file  Food Insecurity: Not on file  Transportation Needs: Not on file  Physical Activity: Not on file  Stress: Not on file  Social Connections: Not on file  Intimate Partner  Violence: Not on file      PHYSICAL EXAM  Vitals:   01/04/23 1504 01/04/23 1507  BP: (!) 156/70 (!) 148/68  Pulse: 85 81  Weight: 139 lb (63 kg)   Height: 5\' 3"  (1.6 m)     Body mass index is 24.62 kg/m.  General: well developed, well nourished, very pleasant elderly Caucasian female, seated, in no evident distress Head: head normocephalic and atraumatic.   Neck: supple with no carotid or supraclavicular bruits Cardiovascular: regular rate and rhythm, no murmurs Musculoskeletal: no deformity; LUE deformity distal Skin:  no rash/petichiae Vascular:  Normal pulses all extremities   Neurologic Exam Mental Status: Awake and fully alert. Oriented to place and time. Recent and remote memory intact. Attention span, concentration and fund of knowledge appropriate. Mood and affect appropriate.  Cranial Nerves: Pupils equal, briskly reactive to light. Extraocular movements full without nystagmus. Visual fields full to confrontation. Hearing intact. Facial sensation intact. Face, tongue, palate moves normally and symmetrically.  Motor: Normal bulk and tone. Normal strength in all tested extremity muscles. Sensory.: intact to touch , pinprick , position and vibratory sensation.  Coordination: Rapid alternating movements normal in all extremities. Finger-to-nose and heel-to-shin performed accurately bilaterally. Gait and Station: Arises from chair without difficulty. Stance is normal. Gait demonstrates normal stride length and mild imbalance with difficulty performing tandem walk and walking on heels but is able to ambulate on toes without great difficulty Reflexes: 1+ and symmetric. Toes downgoing.      DIAGNOSTIC DATA (LABS, IMAGING, TESTING) - I reviewed patient records, labs, notes, testing and imaging myself where available.  Lab Results  Component Value Date   WBC 7.3 07/12/2022   HGB 13.8 07/12/2022   HCT 41.5 07/12/2022   MCV 86.5 07/12/2022   PLT 291 07/12/2022       Component Value Date/Time   NA 141 07/12/2022 1440   NA 141 02/06/2017 1616   K 3.8 07/12/2022 1440   CL 104 07/12/2022 1440   CO2 24 07/12/2022 1440   GLUCOSE 84 07/12/2022 1440   BUN 13 07/12/2022 1440   BUN 13 02/06/2017 1616   CREATININE 0.71 07/12/2022 1440   CALCIUM 9.7 07/12/2022 1440   PROT 6.2 07/12/2022 1440   PROT 6.7 02/06/2017 1616   ALBUMIN 4.0 05/12/2017 1053   AST 16 07/12/2022 1440   ALT 9 07/12/2022 1440   ALKPHOS 63 05/01/2017 0715   BILITOT 0.6 07/12/2022 1440  BILITOT <0.2 02/06/2017 1616   GFRNONAA >60 10/27/2021 1322   GFRAA >60 09/10/2019 0549   Lab Results  Component Value Date   VITAMINB12 504 08/31/2017   Lab Results  Component Value Date   TSH 2.640 08/31/2017      ASSESSMENT AND PLAN 82 y.o. year old female  has a past medical history of Allergic rhinitis, Anxiety, Arthritis, Congenital defect, Depression, GERD (gastroesophageal reflux disease), Headache, Hyperlipemia, Hypertension, IBS (irritable bowel syndrome), Insomnia, Memory loss, Vitamin D deficiency, and Wears glasses. here with:  1.  Neck pain 2.  Gait abnormality 3.  Memory loss   Continuation of Flexeril 5 mg twice daily for ongoing benefit of neck pain -refills provided Gait stable without any changes since prior visit - continue to monitor Cognition stable since prior visit - continue to monitor   Follow-up in 1 year or call earlier if needed   I spent 20 minutes of face-to-face and non-face-to-face time with patient.  This included previsit chart review, lab review, study review, order entry, electronic health record documentation, patient education and discussion regarding above diagnoses and treatment plan and answered all the questions to patient's satisfaction  Ihor Austin, [facility]  [facility] [address] [place], [place] [address]  Phone [phone] Fax [phone] Note: This document was prepared with digital dictation  and possible smart phrase technology. Any transcriptional errors that result from this process are unintentional.

## 2023-01-04 ENCOUNTER — Encounter: Payer: Self-pay | Admitting: Adult Health

## 2023-01-04 ENCOUNTER — Ambulatory Visit (INDEPENDENT_AMBULATORY_CARE_PROVIDER_SITE_OTHER): Payer: Medicare Other | Admitting: Adult Health

## 2023-01-04 ENCOUNTER — Telehealth: Payer: Self-pay

## 2023-01-04 VITALS — BP 148/68 | HR 81 | Ht 63.0 in | Wt 139.0 lb

## 2023-01-04 DIAGNOSIS — M542 Cervicalgia: Secondary | ICD-10-CM

## 2023-01-04 DIAGNOSIS — R2681 Unsteadiness on feet: Secondary | ICD-10-CM

## 2023-01-04 MED ORDER — CYCLOBENZAPRINE HCL 5 MG PO TABS: 5.0000 mg | ORAL_TABLET | Freq: Two times a day (BID) | ORAL | 11 refills | Status: AC

## 2023-01-04 NOTE — Telephone Encounter (Signed)
Patient called inquiring about dermatology referral. Patient states that now he BP is up and body is hot from the rash on her body. I checked referral status and they are waiting on notes to be closed before they send the referral.  Message routed to Durenda Age, NP

## 2023-01-04 NOTE — Patient Instructions (Addendum)
  No changes today - we will see you back in 1 year or sooner if needed      Thank you for coming to see [place] at [facility]. I hope we have been able to provide you high quality care today.  You may receive a patient satisfaction survey over the next few weeks. We would appreciate your feedback and comments so that we may continue to improve ourselves and the health of our patients.

## 2023-02-28 ENCOUNTER — Telehealth: Payer: Medicare Other

## 2023-02-28 NOTE — Telephone Encounter (Signed)
Patient called and left message on clinical intake voicemail. She would like something for sleep. She says that she took lots of Tylenol last night and became sick because of how much she took. She would like something for sleep because she hasn't been seen yet for the ringing in her ears.  Message routed to Durenda Age, NP High priority

## 2023-03-02 NOTE — Telephone Encounter (Signed)
LATE ENTRY:  Tried calling patient back 02/28/2023,but no answer.

## 2023-03-21 DIAGNOSIS — H6123 Impacted cerumen, bilateral: Secondary | ICD-10-CM | POA: Diagnosis not present

## 2023-03-21 DIAGNOSIS — H9313 Tinnitus, bilateral: Secondary | ICD-10-CM | POA: Diagnosis not present

## 2023-03-21 DIAGNOSIS — H903 Sensorineural hearing loss, bilateral: Secondary | ICD-10-CM | POA: Diagnosis not present

## 2023-04-21 ENCOUNTER — Encounter: Payer: Self-pay | Admitting: Adult Health

## 2023-04-21 ENCOUNTER — Ambulatory Visit (INDEPENDENT_AMBULATORY_CARE_PROVIDER_SITE_OTHER): Payer: Medicare Other | Admitting: Adult Health

## 2023-04-21 VITALS — BP 138/90 | HR 78 | Temp 97.5°F | Resp 18 | Ht 63.0 in | Wt 145.0 lb

## 2023-04-21 DIAGNOSIS — G629 Polyneuropathy, unspecified: Secondary | ICD-10-CM | POA: Diagnosis not present

## 2023-04-21 DIAGNOSIS — I10 Essential (primary) hypertension: Secondary | ICD-10-CM | POA: Diagnosis not present

## 2023-04-21 DIAGNOSIS — F5101 Primary insomnia: Secondary | ICD-10-CM | POA: Diagnosis not present

## 2023-04-21 DIAGNOSIS — Z13820 Encounter for screening for osteoporosis: Secondary | ICD-10-CM

## 2023-04-21 DIAGNOSIS — Z780 Asymptomatic menopausal state: Secondary | ICD-10-CM | POA: Diagnosis not present

## 2023-04-21 DIAGNOSIS — H9313 Tinnitus, bilateral: Secondary | ICD-10-CM | POA: Diagnosis not present

## 2023-04-21 MED ORDER — MELATONIN 5 MG PO TABS: 5.0000 mg | ORAL_TABLET | Freq: Every day | ORAL | 9 refills | Status: AC

## 2023-04-21 NOTE — Progress Notes (Signed)
[facility] clinic  Provider:  Kenard Gower DNP  Code Status:  Full Code Goals of Care:     04/21/2023    3:03 PM  Advanced Directives  Does Patient Have a Medical Advance Directive? No  Would patient like information on creating a medical advance directive? No - Patient declined     Chief Complaint  Patient presents with   Follow-up    Patient is here for a 9M F/U for hypertension and other chronic conditions   Quality Metric Gaps    Needs to discuss Dexa scan, Medicare Annual Wellness Visit, Covid, Shingrix, and Pneumonia     HPI: Patient is a 82 y.o. female seen today for a 3 month follow up of chronic medical issues. She complains of having difficulty staying asleep at night. She reported taking Acetaminophen every time she wakes up. Discussed that taking acetaminophen will not help with difficulty sleeping.   Primary hypertension 138/90, takes HCTZ and Metoprolol tartrate  Neuropathy  -  takes Gabapentin, soles of feet "burn"  Tinnitus of both ears -  improved, electric fan helps her  Past Medical History:  Diagnosis Date   Allergic rhinitis    Anxiety    Arthritis    right shoulder   Congenital defect    left arm   Depression    GERD (gastroesophageal reflux disease)    Headache    Hyperlipemia    Hypertension    IBS (irritable bowel syndrome)    Insomnia    Memory loss    Vitamin D deficiency    Wears glasses     Past Surgical History:  Procedure Laterality Date   ABDOMINAL HYSTERECTOMY     CATARACT EXTRACTION Bilateral    NECK SURGERY     REVERSE SHOULDER ARTHROPLASTY Right 09/09/2019   Procedure: REVERSE SHOULDER ARTHROPLASTY;  Surgeon: Ernest Mallick, MD;  Location: [facility] OR;  Service: Orthopedics;  Laterality: Right;     Allergies  Allergen Reactions   Norvasc [Amlodipine] Swelling    Pt not aware of    Benicar [Olmesartan] Other (See Comments)    Nervousness Pt not aware of    Lisinopril Cough    Pt not aware of      Outpatient Encounter Medications as of 04/21/2023  Medication Sig   acetaminophen (TYLENOL) 500 MG tablet Take 1,000 mg by mouth 3 (three) times daily.   artificial tears (LACRILUBE) OINT ophthalmic ointment Place into both eyes at bedtime.   cyclobenzaprine (FLEXERIL) 5 MG tablet Take 1 tablet (5 mg total) by mouth 2 (two) times daily.   gabapentin (NEURONTIN) 100 MG capsule Take 1= 100 mg  capsule in the morning and 2 capsules = 200 mg at bedtime   hydrochlorothiazide (HYDRODIURIL) 25 MG tablet Take 1 tablet (25 mg total) by mouth daily.   metoprolol tartrate (LOPRESSOR) 50 MG tablet Take 1.5 tablets (75 mg total) by mouth 2 (two) times daily.   potassium chloride (KLOR-CON M) 10 MEQ tablet Take 1 tablet (10 mEq total) by mouth 3 (three) times a week.   No facility-administered encounter medications on file as of 04/21/2023.    Review of Systems:  Review of Systems  Constitutional:  Negative for appetite change, chills, fatigue and fever.  HENT:  Negative for congestion, hearing loss, rhinorrhea and sore throat.   Eyes: Negative.   Respiratory:  Negative for cough, shortness of breath and wheezing.   Cardiovascular:  Negative for chest pain, palpitations and leg swelling.  Gastrointestinal:  Negative  for abdominal pain, constipation, diarrhea, nausea and vomiting.  Genitourinary:  Negative for dysuria.  Musculoskeletal:  Negative for arthralgias, back pain and myalgias.  Skin:  Negative for color change, rash and wound.  Neurological:  Negative for dizziness, weakness and headaches.  Psychiatric/Behavioral:  Positive for sleep disturbance. Negative for behavioral problems. The patient is not nervous/anxious.     Health Maintenance  Topic Date Due   COVID-19 Vaccine (1) Never done   Zoster Vaccines- Shingrix (1 of 2) Never done   Pneumonia Vaccine 46+ Years old (1 of 1 - PCV) Never done   DEXA SCAN  Never done   Medicare Annual Wellness (AWV)  02/17/2022   INFLUENZA VACCINE   07/13/2023   DTaP/Tdap/Td (4 - Td or Tdap) 03/07/2032   HPV VACCINES  Aged Out    Physical Exam: Vitals:   04/21/23 1506  BP: (!) 138/90  Pulse: 78  Resp: 18  Temp: (!) 97.5 F (36.4 C)  SpO2: 95%  Weight: 145 lb (65.8 kg)  Height: 5\' 3"  (1.6 m)   Body mass index is 25.69 kg/m. Physical Exam Constitutional:      Appearance: Normal appearance.  HENT:     Head: Normocephalic and atraumatic.     Nose: Nose normal.     Mouth/Throat:     Mouth: Mucous membranes are moist.  Eyes:     Conjunctiva/sclera: Conjunctivae normal.  Cardiovascular:     Rate and Rhythm: Normal rate and regular rhythm.  Pulmonary:     Effort: Pulmonary effort is normal.     Breath sounds: Normal breath sounds.  Abdominal:     General: Bowel sounds are normal.     Palpations: Abdomen is soft.  Musculoskeletal:        General: Normal range of motion.     Cervical back: Normal range of motion.     Comments: No left hand   Skin:    General: Skin is warm and dry.  Neurological:     General: No focal deficit present.     Mental Status: She is alert and oriented to person, place, and time.  Psychiatric:        Mood and Affect: Mood normal.        Behavior: Behavior normal.        Thought Content: Thought content normal.        Judgment: Judgment normal.     Labs reviewed: Basic Metabolic Panel: Recent Labs    07/12/22 1440  NA 141  K 3.8  CL 104  CO2 24  GLUCOSE 84  BUN 13  CREATININE 0.71  CALCIUM 9.7   Liver Function Tests: Recent Labs    07/12/22 1440  AST 16  ALT 9  BILITOT 0.6  PROT 6.2   No results for input(s): "LIPASE", "AMYLASE" in the last 8760 hours. No results for input(s): "AMMONIA" in the last 8760 hours. CBC: Recent Labs    07/12/22 1440  WBC 7.3  NEUTROABS 5,044  HGB 13.8  HCT 41.5  MCV 86.5  PLT 291   Lipid Panel: Recent Labs    07/12/22 1440  CHOL 267*  HDL 78  LDLCALC 159*  TRIG 163*  CHOLHDL 3.4   No results found for:  "HGBA1C"  Procedures since last visit: No results found.  Assessment/Plan  1. Primary insomnia - melatonin 5 MG TABS; Take 1 tablet (5 mg total) by mouth at bedtime.  Dispense: 30 tablet; Refill: 9  2. Primary hypertension -  BP 138/90, stable -  continue HCTZ and KCL and Metoprolol tartrate - Basic Metabolic Panel with eGFR  3. Neuropathy -  has burning sensation on soles of feet -  continue Gabapentin  4. Tinnitus of both ears -  improved, continue using fan -  seen by ENT and Audiology on 03/21/23  5. Encounter for screening for osteoporosis -  Postmenopausal estrogen deficiency - DG BONE DENSITY (DXA); Future    Labs/tests ordered:  BMP  Next appt:  Visit date not found

## 2023-04-22 LAB — BASIC METABOLIC PANEL WITH GFR
BUN: 21 mg/dL (ref 7–25)
CO2: 27 mmol/L (ref 20–32)
Calcium: 9.9 mg/dL (ref 8.6–10.4)
Chloride: 102 mmol/L (ref 98–110)
Creat: 0.77 mg/dL (ref 0.60–0.95)
Glucose, Bld: 96 mg/dL (ref 65–99)
Potassium: 3.6 mmol/L (ref 3.5–5.3)
Sodium: 138 mmol/L (ref 135–146)
eGFR: 76 mL/min/{1.73_m2} (ref >=60)

## 2023-04-24 NOTE — Progress Notes (Signed)
-   electrolytes, kidney function are all within normal.

## 2023-04-26 ENCOUNTER — Telehealth: Payer: Self-pay

## 2023-04-26 NOTE — Telephone Encounter (Signed)
Medina-Vargas, Margit Banda, NP  You3 hours ago (11:29 AM)    [facility] at [address] [place], [place] [address] tel# [phone]

## 2023-04-26 NOTE — Telephone Encounter (Signed)
Patient called stating that on her last visit she was supposed to be given the name of a dermatologist on [address]. Please advise.  Message sent to Kenard Gower, NP

## 2023-04-26 NOTE — Telephone Encounter (Signed)
Tried calling patient, no answer. I left detailed message on patient's voicemail.

## 2023-04-28 NOTE — Telephone Encounter (Signed)
Patient called back and was given the name and number for dermatology

## 2023-05-02 ENCOUNTER — Telehealth: Payer: Self-pay

## 2023-05-02 NOTE — Addendum Note (Signed)
Addended by: Kenard Gower C on: 05/02/2023 01:07 PM   Modules accepted: Orders

## 2023-05-02 NOTE — Telephone Encounter (Signed)
Sent a new referral and added in a comment about wanting to be seen earlier than August.

## 2023-05-02 NOTE — Telephone Encounter (Signed)
Patient called stating that [facility] isn't seeing anyone until August. She states that she still has the rash. She would like to see about being referred somewhere else.  Message sent to Kenard Gower, NP

## 2023-05-03 NOTE — Telephone Encounter (Signed)
I spoke with patient and informed her about the long wait for dermatology appointment. She stated that she would like to have some medication for rash.

## 2023-05-03 NOTE — Telephone Encounter (Signed)
-   her rashes looks like seborrheic keratosis generally does not require treatment but needs to be seen by a dermatologist and possibly have liquid nitrogen cryotherapy.

## 2023-05-05 NOTE — Telephone Encounter (Signed)
Spoke with patient and she verbalized her understanding. 

## 2023-06-22 DIAGNOSIS — F4381 Prolonged grief disorder: Secondary | ICD-10-CM | POA: Diagnosis not present

## 2023-06-22 DIAGNOSIS — R002 Palpitations: Secondary | ICD-10-CM | POA: Diagnosis not present

## 2023-06-22 DIAGNOSIS — I493 Ventricular premature depolarization: Secondary | ICD-10-CM | POA: Diagnosis not present

## 2023-06-22 DIAGNOSIS — F4320 Adjustment disorder, unspecified: Secondary | ICD-10-CM | POA: Diagnosis not present

## 2023-07-04 ENCOUNTER — Telehealth: Payer: Self-pay

## 2023-07-04 NOTE — Telephone Encounter (Signed)
Message left on clinical intake voicemail:   " I am returning Monina's call" I reviewed chart and was unable to determine what Monina had called patient about.

## 2023-07-04 NOTE — Telephone Encounter (Signed)
I did not call her.

## 2023-07-04 NOTE — Telephone Encounter (Signed)
I returned call to patient and shared Monina's response and left a detailed message suggesting that she listen closely to her voicemail to get the name of the person who called her.

## 2023-07-13 ENCOUNTER — Telehealth: Payer: Self-pay

## 2023-07-13 NOTE — Telephone Encounter (Signed)
Patient called stating that she will no longer be a patient here at [facility]. She stated that she will be moving to [place] due to her son passing sway and moving to be with her children.  I removed Monina Medina-Vargas, NP as her PCP.

## 2023-07-24 ENCOUNTER — Ambulatory Visit: Payer: Medicare Other | Admitting: Adult Health

## 2024-01-05 ENCOUNTER — Telehealth: Payer: Self-pay | Admitting: Adult Health

## 2024-01-05 NOTE — Telephone Encounter (Signed)
Pt's daughter cx appt due to pt relocating to a nursing facility in [place]. States pt may be transferring care to a closer practice

## 2024-01-10 ENCOUNTER — Ambulatory Visit: Payer: Medicare Other | Admitting: Adult Health

## 2024-05-12 DEATH — deceased
